# Patient Record
Sex: Male | Born: 1965 | Race: Black or African American | Hispanic: No | Marital: Single | Smoking: Current some day smoker
Health system: Southern US, Community
[De-identification: ages and names within clinical notes are randomized; demographics above are authoritative.]

## PROBLEM LIST (undated history)

## (undated) DIAGNOSIS — A159 Respiratory tuberculosis unspecified: Secondary | ICD-10-CM

## (undated) DIAGNOSIS — I10 Essential (primary) hypertension: Secondary | ICD-10-CM

## (undated) DIAGNOSIS — G4730 Sleep apnea, unspecified: Secondary | ICD-10-CM

## (undated) DIAGNOSIS — N189 Chronic kidney disease, unspecified: Secondary | ICD-10-CM

## (undated) DIAGNOSIS — G43909 Migraine, unspecified, not intractable, without status migrainosus: Secondary | ICD-10-CM

## (undated) DIAGNOSIS — E119 Type 2 diabetes mellitus without complications: Secondary | ICD-10-CM

## (undated) DIAGNOSIS — R569 Unspecified convulsions: Secondary | ICD-10-CM

## (undated) HISTORY — DX: Chronic kidney disease, unspecified: N18.9

## (undated) HISTORY — PX: APPENDECTOMY: SHX54

## (undated) HISTORY — DX: Essential (primary) hypertension: I10

## (undated) HISTORY — DX: Respiratory tuberculosis unspecified: A15.9

## (undated) HISTORY — DX: Type 2 diabetes mellitus without complications: E11.9

## (undated) HISTORY — DX: Unspecified convulsions: R56.9

## (undated) HISTORY — DX: Migraine, unspecified, not intractable, without status migrainosus: G43.909

---

## 2007-10-16 DIAGNOSIS — A159 Respiratory tuberculosis unspecified: Secondary | ICD-10-CM

## 2007-10-16 HISTORY — DX: Respiratory tuberculosis unspecified: A15.9

## 2013-08-04 ENCOUNTER — Emergency Department: Payer: Self-pay | Admitting: Emergency Medicine

## 2013-08-04 LAB — TSH: Thyroid Stimulating Horm: 1.21 u[IU]/mL

## 2013-08-04 LAB — BASIC METABOLIC PANEL
Calcium, Total: 9.2 mg/dL (ref 8.5–10.1)
Chloride: 104 mmol/L (ref 98–107)
Co2: 25 mmol/L (ref 21–32)
Creatinine: 1.68 mg/dL — ABNORMAL HIGH (ref 0.60–1.30)
EGFR (African American): 55 — ABNORMAL LOW
EGFR (Non-African Amer.): 47 — ABNORMAL LOW
Glucose: 111 mg/dL — ABNORMAL HIGH (ref 65–99)
Sodium: 135 mmol/L — ABNORMAL LOW (ref 136–145)

## 2013-08-04 LAB — CBC
MCH: 27.9 pg (ref 26.0–34.0)
MCV: 84 fL (ref 80–100)
RDW: 15.3 % — ABNORMAL HIGH (ref 11.5–14.5)

## 2013-08-04 LAB — TROPONIN I: Troponin-I: <0.02 ng/mL

## 2014-08-01 IMAGING — CR DG CHEST 2V
1 series · 2 of 2 positions shown · non-contrast
Comparison: none

REASON FOR EXAM: Chest Pain
COMMENTS:

[Series 1: pa · 0.17mm/px · 2 of 2 slices shown]
[im 1/2]
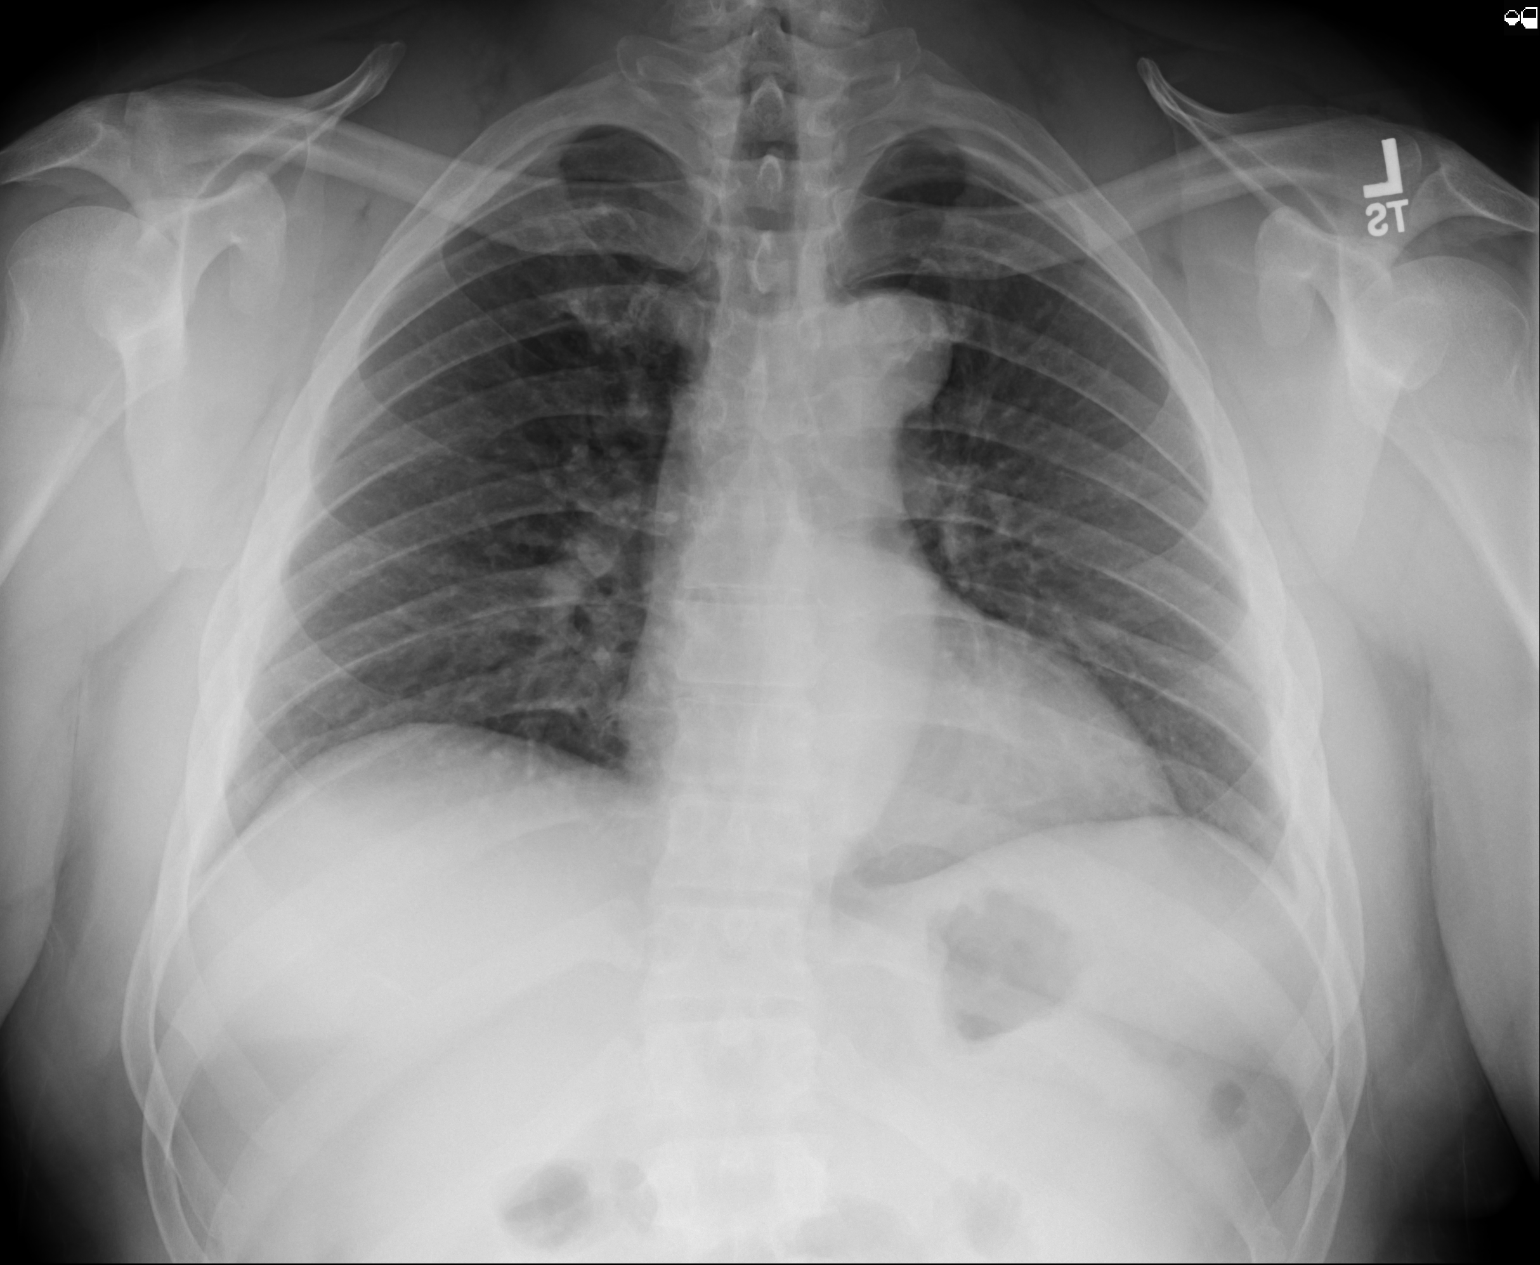
[im 2/2]
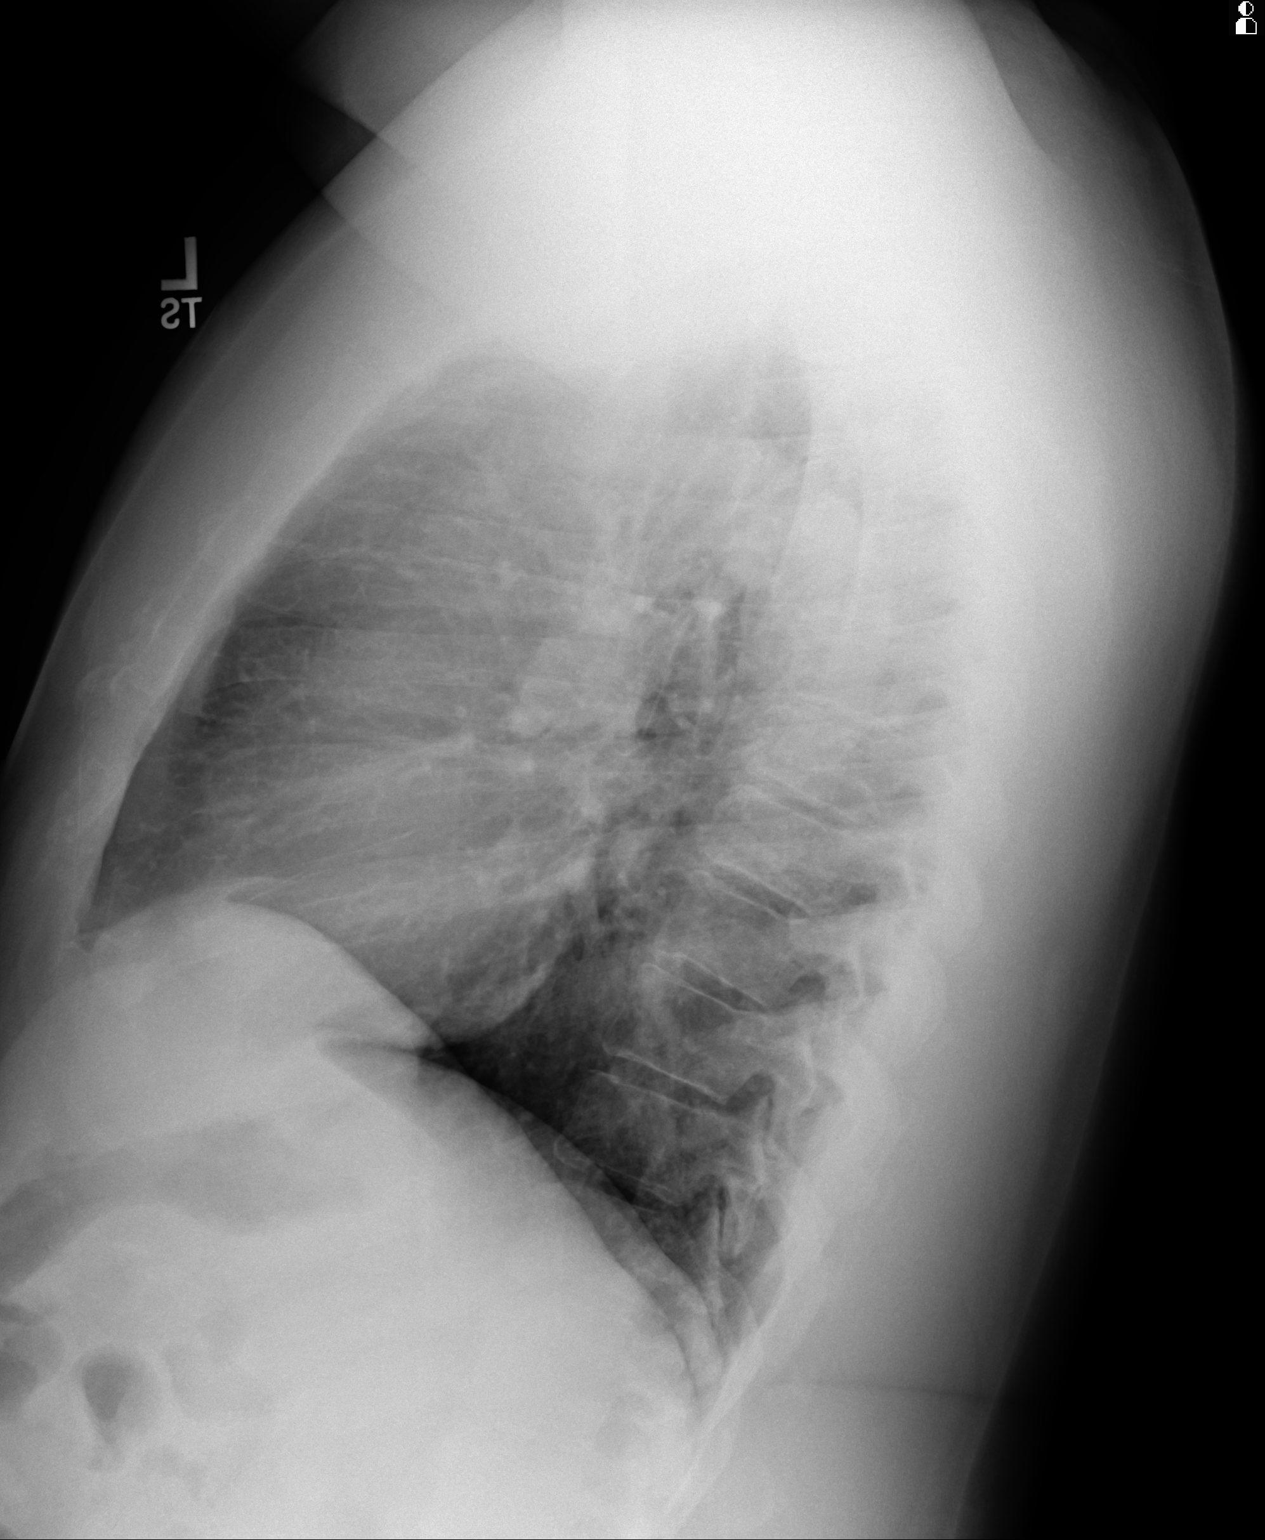

[2 of 2 positions shown; findings below may reference images not displayed]

PROCEDURE:     DXR - DXR CHEST PA (OR AP) AND LATERAL  - August 04, 2013  [DATE]

RESULT:     The lungs are reasonably well inflated. There is no focal
infiltrate. The cardiac silhouette is normal in size. There is tortuosity of
the descending thoracic aorta. There is no pleural effusion. The mediastinum
is normal in width.
IMPRESSION: There is no evidence of pneumonia nor CHF nor other acute
cardiopulmonary abnormality.

[REDACTED]

## 2015-04-27 ENCOUNTER — Ambulatory Visit: Payer: Self-pay | Admitting: Internal Medicine

## 2015-05-04 ENCOUNTER — Ambulatory Visit: Payer: Self-pay | Admitting: Internal Medicine

## 2015-05-11 ENCOUNTER — Ambulatory Visit: Payer: Self-pay | Admitting: Internal Medicine

## 2015-05-11 DIAGNOSIS — I129 Hypertensive chronic kidney disease with stage 1 through stage 4 chronic kidney disease, or unspecified chronic kidney disease: Secondary | ICD-10-CM | POA: Insufficient documentation

## 2015-05-11 LAB — HEPATIC FUNCTION PANEL
ALK PHOS: 72 U/L (ref 25–125)
ALT: 14 U/L (ref 10–40)
AST: 14 U/L (ref 14–40)
Bilirubin, Total: 0.3 mg/dL

## 2015-05-11 LAB — CBC AND DIFFERENTIAL
HCT: 45 % (ref 41–53)
Hemoglobin: 15.2 g/dL (ref 13.5–17.5)
NEUTROS ABS: 5 /uL
Platelets: 296 10*3/uL (ref 150–399)
WBC: 7.9 10^3/mL

## 2015-05-11 LAB — LIPID PANEL
Cholesterol: 167 mg/dL (ref 0–200)
HDL: 40 mg/dL (ref 35–70)
LDL CALC: 79 mg/dL
Triglycerides: 238 mg/dL — AB (ref 40–160)

## 2015-05-11 LAB — PSA: PSA: 0.5

## 2015-05-11 LAB — HEMOGLOBIN A1C: Hemoglobin A1C: 6.1

## 2015-05-11 LAB — TSH: TSH: 2.34 u[IU]/mL (ref 0.41–5.90)

## 2015-06-08 ENCOUNTER — Ambulatory Visit: Payer: Self-pay | Admitting: Internal Medicine

## 2015-06-15 ENCOUNTER — Ambulatory Visit: Payer: Self-pay | Admitting: Internal Medicine

## 2015-07-27 ENCOUNTER — Other Ambulatory Visit: Payer: Self-pay

## 2015-07-27 LAB — BASIC METABOLIC PANEL
BUN: 14 mg/dL (ref 4–21)
Creatinine: 1.4 mg/dL — AB (ref 0.6–1.3)
GLUCOSE: 130 mg/dL
POTASSIUM: 3.5 mmol/L (ref 3.4–5.3)
Sodium: 139 mmol/L (ref 137–147)

## 2015-08-03 ENCOUNTER — Ambulatory Visit: Payer: Self-pay | Admitting: Internal Medicine

## 2015-08-10 ENCOUNTER — Ambulatory Visit: Payer: Self-pay | Admitting: Internal Medicine

## 2015-08-17 ENCOUNTER — Ambulatory Visit: Payer: Self-pay | Admitting: Internal Medicine

## 2015-08-24 ENCOUNTER — Ambulatory Visit: Payer: Self-pay | Admitting: Internal Medicine

## 2015-09-07 DIAGNOSIS — G4730 Sleep apnea, unspecified: Secondary | ICD-10-CM | POA: Insufficient documentation

## 2015-09-14 ENCOUNTER — Ambulatory Visit: Payer: Self-pay | Admitting: Internal Medicine

## 2015-09-21 ENCOUNTER — Ambulatory Visit: Payer: Self-pay

## 2015-10-05 ENCOUNTER — Ambulatory Visit: Payer: Self-pay

## 2015-10-05 ENCOUNTER — Other Ambulatory Visit: Payer: Self-pay

## 2015-10-11 ENCOUNTER — Ambulatory Visit: Payer: Self-pay

## 2015-10-12 ENCOUNTER — Ambulatory Visit: Payer: Self-pay | Admitting: Internal Medicine

## 2016-02-21 DIAGNOSIS — I10 Essential (primary) hypertension: Secondary | ICD-10-CM

## 2016-02-21 DIAGNOSIS — G4730 Sleep apnea, unspecified: Secondary | ICD-10-CM

## 2018-06-26 ENCOUNTER — Ambulatory Visit: Payer: Self-pay

## 2018-07-10 ENCOUNTER — Ambulatory Visit: Payer: Self-pay | Admitting: Adult Health Nurse Practitioner

## 2018-07-10 VITALS — BP 192/134 | HR 92 | Temp 98.2°F | Ht 69.0 in | Wt 243.9 lb

## 2018-07-10 DIAGNOSIS — I10 Essential (primary) hypertension: Secondary | ICD-10-CM

## 2018-07-10 DIAGNOSIS — N183 Chronic kidney disease, stage 3 unspecified: Secondary | ICD-10-CM | POA: Insufficient documentation

## 2018-07-10 MED ORDER — VERAPAMIL HCL ER 240 MG PO TBCR: 240.0000 mg | EXTENDED_RELEASE_TABLET | Freq: Every day | ORAL | 1 refills | Status: DC

## 2018-07-10 MED ORDER — HYDROCHLOROTHIAZIDE 25 MG PO TABS: 25.0000 mg | ORAL_TABLET | Freq: Every day | ORAL | 1 refills | Status: DC

## 2018-07-10 NOTE — Patient Instructions (Signed)
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
## 2018-07-11 LAB — COMPREHENSIVE METABOLIC PANEL
A/G RATIO: 1.2 (ref 1.2–2.2)
ALBUMIN: 4.2 g/dL (ref 3.5–5.5)
ALT: 11 IU/L (ref 0–44)
AST: 12 IU/L (ref 0–40)
Alkaline Phosphatase: 82 IU/L (ref 39–117)
BUN / CREAT RATIO: 10 (ref 9–20)
BUN: 18 mg/dL (ref 6–24)
Bilirubin Total: 0.4 mg/dL (ref 0.0–1.2)
CALCIUM: 9.8 mg/dL (ref 8.7–10.2)
CO2: 27 mmol/L (ref 20–29)
Chloride: 100 mmol/L (ref 96–106)
Creatinine, Ser: 1.86 mg/dL — ABNORMAL HIGH (ref 0.76–1.27)
GFR calc Af Amer: 47 mL/min/{1.73_m2} — ABNORMAL LOW (ref >59)
GFR, EST NON AFRICAN AMERICAN: 40 mL/min/{1.73_m2} — AB (ref >59)
GLUCOSE: 95 mg/dL (ref 65–99)
Globulin, Total: 3.6 g/dL (ref 1.5–4.5)
Potassium: 4.1 mmol/L (ref 3.5–5.2)
Sodium: 142 mmol/L (ref 134–144)
TOTAL PROTEIN: 7.8 g/dL (ref 6.0–8.5)

## 2018-07-11 LAB — LIPID PANEL
CHOL/HDL RATIO: 3.2 ratio (ref 0.0–5.0)
Cholesterol, Total: 162 mg/dL (ref 100–199)
HDL: 50 mg/dL (ref >39)
LDL Calculated: 72 mg/dL (ref 0–99)
Triglycerides: 202 mg/dL — ABNORMAL HIGH (ref 0–149)
VLDL CHOLESTEROL CAL: 40 mg/dL (ref 5–40)

## 2018-07-11 LAB — CBC
HEMOGLOBIN: 15 g/dL (ref 13.0–17.7)
Hematocrit: 44 % (ref 37.5–51.0)
MCH: 27.8 pg (ref 26.6–33.0)
MCHC: 34.1 g/dL (ref 31.5–35.7)
MCV: 82 fL (ref 79–97)
PLATELETS: 283 10*3/uL (ref 150–450)
RBC: 5.39 x10E6/uL (ref 4.14–5.80)
RDW: 15.5 % — ABNORMAL HIGH (ref 12.3–15.4)
WBC: 8.1 10*3/uL (ref 3.4–10.8)

## 2018-07-11 LAB — TSH: TSH: 1.86 u[IU]/mL (ref 0.450–4.500)

## 2018-07-11 LAB — HEMOGLOBIN A1C
Est. average glucose Bld gHb Est-mCnc: 114 mg/dL
Hgb A1c MFr Bld: 5.6 % (ref 4.8–5.6)

## 2018-08-07 ENCOUNTER — Ambulatory Visit: Payer: Self-pay | Admitting: Family Medicine

## 2018-08-07 VITALS — BP 190/129 | HR 83 | Temp 97.7°F | Wt 251.2 lb

## 2018-08-07 DIAGNOSIS — I129 Hypertensive chronic kidney disease with stage 1 through stage 4 chronic kidney disease, or unspecified chronic kidney disease: Secondary | ICD-10-CM

## 2018-08-07 DIAGNOSIS — N400 Enlarged prostate without lower urinary tract symptoms: Secondary | ICD-10-CM | POA: Insufficient documentation

## 2018-08-07 DIAGNOSIS — N401 Enlarged prostate with lower urinary tract symptoms: Secondary | ICD-10-CM

## 2018-08-07 DIAGNOSIS — G8929 Other chronic pain: Secondary | ICD-10-CM

## 2018-08-07 DIAGNOSIS — M545 Low back pain: Secondary | ICD-10-CM

## 2018-08-07 DIAGNOSIS — R351 Nocturia: Secondary | ICD-10-CM

## 2018-08-07 MED ORDER — TAMSULOSIN HCL 0.4 MG PO CAPS: 0.4000 mg | ORAL_CAPSULE | Freq: Every day | ORAL | 3 refills | Status: DC

## 2018-08-07 MED ORDER — AMLODIPINE BESYLATE 10 MG PO TABS: 10.0000 mg | ORAL_TABLET | Freq: Every day | ORAL | 3 refills | Status: DC

## 2018-08-07 NOTE — Progress Notes (Signed)
BP (!) 190/129   Pulse 83   Temp 97.7 F (36.5 C)   Wt 251 lb 3.2 oz (113.9 kg)   BMI 37.10 kg/m    Subjective:    Patient ID: Anthony Lam, male    DOB: [birth year], 52 y.o.   MRN: [id number]  HPI: Anthony Lam is a 52 y.o. male  Chief Complaint  Patient presents with  . Follow-up   Wants disability paperwork filled out   Last seen about a month ago for his blood pressure- had been running really badly. Had been seen in 2017, but then moved away, Used to be on verapamil and HCTZ  HYPERTENSION- only started taking his medicine last week Hypertension status: stable  Satisfied with current treatment? no Duration of hypertension: chronic BP monitoring frequency:  not checking BP medication side effects:  no Medication compliance: good compliance Previous BP meds: verapamil and HCTZ Aspirin: no Recurrent headaches: no Visual changes: no Palpitations: no Dyspnea: no Chest pain: no Lower extremity edema: no Dizzy/lightheaded: no  BPH BPH status: uncontrolled Satisfied with current treatment?: not on anything Duration: chronic Nocturia: 4-5x per night Urinary frequency:yes Incomplete voiding: yes Urgency: yes Weak urinary stream: yes Straining to start stream: yes Dysuria: no Onset: gradual Severity: severe   Relevant past medical, surgical, family and social history reviewed and updated as indicated. Interim medical history since our last visit reviewed. Allergies and medications reviewed and updated.  Review of Systems  Constitutional: Negative.   Respiratory: Negative.   Cardiovascular: Negative.   Musculoskeletal: Positive for back pain and myalgias. Negative for arthralgias, gait problem, joint swelling, neck pain and neck stiffness.  Skin: Negative.   Neurological: Negative.   Psychiatric/Behavioral: Negative.     Per HPI unless specifically indicated above     Objective:    BP (!) 190/129   Pulse 83   Temp 97.7 F (36.5 C)   Wt 251  lb 3.2 oz (113.9 kg)   BMI 37.10 kg/m   Wt Readings from Last 3 Encounters:  08/07/18 251 lb 3.2 oz (113.9 kg)  07/10/18 243 lb 14.4 oz (110.6 kg)  09/07/15 239 lb (108.4 kg)    Physical Exam  Constitutional: He is oriented to person, place, and time. He appears well-developed and well-nourished. No distress.  HENT:  Head: Normocephalic and atraumatic.  Right Ear: Hearing normal.  Left Ear: Hearing normal.  Nose: Nose normal.  Eyes: Conjunctivae and lids are normal. Right eye exhibits no discharge. Left eye exhibits no discharge. No scleral icterus.  Cardiovascular: Normal rate, regular rhythm, normal heart sounds and intact distal pulses. Exam reveals no gallop and no friction rub.  No murmur heard. Pulmonary/Chest: Effort normal and breath sounds normal. No stridor. No respiratory distress. He has no wheezes. He has no rales. He exhibits no tenderness.  Musculoskeletal: Normal range of motion.  Neurological: He is alert and oriented to person, place, and time.  Skin: Skin is warm, dry and intact. Capillary refill takes less than 2 seconds. No rash noted. He is not diaphoretic. No erythema. No pallor.  Psychiatric: He has a normal mood and affect. His speech is normal and behavior is normal. Judgment and thought content normal. Cognition and memory are normal.  Nursing note and vitals reviewed.   Results for orders placed or performed in visit on 07/10/18  CBC  Result Value Ref Range   WBC 8.1 3.4 - 10.8 x10E3/uL   RBC 5.39 4.14 - 5.80 x10E6/uL   Hemoglobin 15.0 13.0 -  17.7 g/dL   Hematocrit 44.0 37.5 - 51.0 %   MCV 82 79 - 97 fL   MCH 27.8 26.6 - 33.0 pg   MCHC 34.1 31.5 - 35.7 g/dL   RDW 15.5 (H) 12.3 - 15.4 %   Platelets 283 150 - 450 x10E3/uL  Comp Met (CMET)  Result Value Ref Range   Glucose 95 65 - 99 mg/dL   BUN 18 6 - 24 mg/dL   Creatinine, Ser 1.86 (H) 0.76 - 1.27 mg/dL   GFR calc non Af Amer 40 (L) >59 mL/min/1.73   GFR calc Af Amer 47 (L) >59 mL/min/1.73    BUN/Creatinine Ratio 10 9 - 20   Sodium 142 134 - 144 mmol/L   Potassium 4.1 3.5 - 5.2 mmol/L   Chloride 100 96 - 106 mmol/L   CO2 27 20 - 29 mmol/L   Calcium 9.8 8.7 - 10.2 mg/dL   Total Protein 7.8 6.0 - 8.5 g/dL   Albumin 4.2 3.5 - 5.5 g/dL   Globulin, Total 3.6 1.5 - 4.5 g/dL   Albumin/Globulin Ratio 1.2 1.2 - 2.2   Bilirubin Total 0.4 0.0 - 1.2 mg/dL   Alkaline Phosphatase 82 39 - 117 IU/L   AST 12 0 - 40 IU/L   ALT 11 0 - 44 IU/L  Lipid Profile  Result Value Ref Range   Cholesterol, Total 162 100 - 199 mg/dL   Triglycerides 202 (H) 0 - 149 mg/dL   HDL 50 >39 mg/dL   VLDL Cholesterol Cal 40 5 - 40 mg/dL   LDL Calculated 72 0 - 99 mg/dL   Chol/HDL Ratio 3.2 0.0 - 5.0 ratio  HgB A1c  Result Value Ref Range   Hgb A1c MFr Bld 5.6 4.8 - 5.6 %   Est. average glucose Bld gHb Est-mCnc 114 mg/dL  TSH  Result Value Ref Range   TSH 1.860 0.450 - 4.500 uIU/mL      Assessment & Plan:   Problem List Items Addressed This Visit      Genitourinary   Benign hypertensive renal disease - Primary    Under poor control. Will continue verapamil and HCTZ and start amlodipine. Checking BMP today. Recheck BP in 2 weeks. Call with any concerns.       Relevant Orders   Basic metabolic panel   BPH (benign prostatic hyperplasia)    Not under good control. Will start flomax and recheck on symptoms in 2 weeks. Call with any concerns.       Relevant Medications   tamsulosin (FLOMAX) 0.4 MG CAPS capsule    Other Visit Diagnoses    Chronic bilateral low back pain without sciatica       Discussed that we cannot fill out disability paperwork at this office- directed him to the social services office for further evaluation.        Follow up plan: Return in about 2 weeks (around 08/21/2018) for Recheck BP.

## 2018-08-07 NOTE — Assessment & Plan Note (Signed)
Not under good control. Will start flomax and recheck on symptoms in 2 weeks. Call with any concerns.

## 2018-08-07 NOTE — Assessment & Plan Note (Signed)
Under poor control. Will continue verapamil and HCTZ and start amlodipine. Checking BMP today. Recheck BP in 2 weeks. Call with any concerns.

## 2018-08-08 LAB — BASIC METABOLIC PANEL
BUN/Creatinine Ratio: 11 (ref 9–20)
BUN: 19 mg/dL (ref 6–24)
CALCIUM: 9.8 mg/dL (ref 8.7–10.2)
CHLORIDE: 102 mmol/L (ref 96–106)
CO2: 25 mmol/L (ref 20–29)
Creatinine, Ser: 1.74 mg/dL — ABNORMAL HIGH (ref 0.76–1.27)
GFR, EST AFRICAN AMERICAN: 51 mL/min/{1.73_m2} — AB (ref >59)
GFR, EST NON AFRICAN AMERICAN: 44 mL/min/{1.73_m2} — AB (ref >59)
Glucose: 88 mg/dL (ref 65–99)
Potassium: 3.7 mmol/L (ref 3.5–5.2)
Sodium: 142 mmol/L (ref 134–144)

## 2018-08-26 ENCOUNTER — Ambulatory Visit: Payer: Self-pay

## 2018-08-28 ENCOUNTER — Ambulatory Visit: Payer: Self-pay | Admitting: Adult Health Nurse Practitioner

## 2018-08-28 VITALS — BP 193/38 | HR 91 | Temp 98.7°F | Ht 68.0 in | Wt 249.2 lb

## 2018-08-28 DIAGNOSIS — N401 Enlarged prostate with lower urinary tract symptoms: Secondary | ICD-10-CM

## 2018-08-28 DIAGNOSIS — M545 Low back pain, unspecified: Secondary | ICD-10-CM

## 2018-08-28 DIAGNOSIS — M549 Dorsalgia, unspecified: Secondary | ICD-10-CM | POA: Insufficient documentation

## 2018-08-28 DIAGNOSIS — R351 Nocturia: Secondary | ICD-10-CM

## 2018-08-28 DIAGNOSIS — I129 Hypertensive chronic kidney disease with stage 1 through stage 4 chronic kidney disease, or unspecified chronic kidney disease: Secondary | ICD-10-CM

## 2018-08-28 DIAGNOSIS — N183 Chronic kidney disease, stage 3 unspecified: Secondary | ICD-10-CM
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
## 2018-09-16 ENCOUNTER — Ambulatory Visit: Payer: Self-pay | Admitting: Specialist

## 2018-09-18 ENCOUNTER — Ambulatory Visit: Payer: Self-pay | Admitting: Family Medicine

## 2018-09-18 VITALS — BP 184/116 | HR 87 | Temp 98.6°F | Ht 68.0 in | Wt 249.5 lb

## 2018-09-18 DIAGNOSIS — N183 Chronic kidney disease, stage 3 unspecified: Secondary | ICD-10-CM

## 2018-09-18 DIAGNOSIS — I10 Essential (primary) hypertension: Secondary | ICD-10-CM

## 2018-09-18 DIAGNOSIS — M545 Low back pain, unspecified: Secondary | ICD-10-CM

## 2018-09-18 MED ORDER — METOPROLOL SUCCINATE ER 50 MG PO TB24: 50.0000 mg | ORAL_TABLET | Freq: Every day | ORAL | 0 refills | Status: DC

## 2018-09-18 MED ORDER — HYDRALAZINE HCL 25 MG PO TABS: 25.0000 mg | ORAL_TABLET | Freq: Three times a day (TID) | ORAL | 1 refills | Status: DC
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
## 2018-09-18 NOTE — Patient Instructions (Addendum)
Stop taking Verapamil.  Begin Metoprolol once daily Begin Hydralazine 1 pill, 3 times a day. If you forget 3 times, start taking 2 times a day.  Do regular exercise and gentle stretching.  Check BP at home when relaxed and record readings and bring to next appointment.

## 2018-09-23 ENCOUNTER — Ambulatory Visit: Payer: Self-pay | Admitting: Specialist

## 2018-09-23 ENCOUNTER — Other Ambulatory Visit: Payer: Self-pay

## 2018-09-23 DIAGNOSIS — M545 Low back pain, unspecified: Secondary | ICD-10-CM

## 2018-09-23 NOTE — Progress Notes (Signed)
  Subjective:     Patient ID: Anthony Lam, male   DOB: September 10, 1966, 52 y.o.   MRN: [id number]  HPI  52 year old with a several year history of axial LBP. I don't believe he's had any treatment for this in the past. When he was working, he was in [employer] business so on his feet all day. He has a history of kidney disease and in fact thought I was evaluating that problem. There is no specific history of trauma.   Review of Systems       Objective:   Physical Exam  On exam he has an antalgic gait because of a right forefoot problem. He tells me he is unable to walk on his toes or heels. He declines to do a squat because of back pain.  He is able to march in place with normal muscle recruitment and relaxation. He is ale to stand on each leg indepenmdently with a negative trendelenburg.  Using a long arm goniometer he has 20 degrees of lateral flexion to his right and 15 degrees to left. He has 45 degrees forward flexion and 10 degrees of extentsion.   Sitting: DTR's at the knees are 0 with reinforcement bilateral;  Ankles 2+ and equal. Toe signs are downward. There is no clonus present.  Sens: Intact and normal. MMT 5/5 in all muscle groups.  Seated: SLR negative.           Assessment:         Plan:    Dx: Nonspecific LBP. Plan: I want to refer him for x rays and return here when they have been completed.

## 2018-09-25 ENCOUNTER — Ambulatory Visit: Payer: Self-pay

## 2018-10-21 ENCOUNTER — Ambulatory Visit: Payer: Self-pay | Admitting: Specialist

## 2018-10-21 ENCOUNTER — Encounter: Payer: Self-pay | Admitting: Gerontology

## 2018-10-21 ENCOUNTER — Ambulatory Visit: Payer: Self-pay | Admitting: Gerontology

## 2018-10-21 VITALS — BP 193/130 | HR 79 | Temp 98.6°F | Ht 69.6 in | Wt 249.3 lb

## 2018-10-21 DIAGNOSIS — M545 Low back pain, unspecified: Secondary | ICD-10-CM

## 2018-10-21 DIAGNOSIS — N183 Chronic kidney disease, stage 3 unspecified: Secondary | ICD-10-CM

## 2018-10-21 DIAGNOSIS — I10 Essential (primary) hypertension: Secondary | ICD-10-CM

## 2018-10-21 NOTE — Progress Notes (Signed)
Pt arrived without having ordered images completed. Pt states he has an appt for same on 10-30-18. Per Dr. Vickki Hearing, pt is to return after images completed for return assessment.

## 2018-10-21 NOTE — Progress Notes (Signed)
Patient: Anthony Lam Male    DOB: 09/09/1966   53 y.o.   MRN: [id number] Visit Date: 10/21/2018  Today's Provider: Langston Reusing, NP   Chief Complaint  Patient presents with  . Chronic Kidney Disease    F/U today for hypertension. States not taking meds as prescribed due to c/o of nausea.   Subjective:    HPI Mr.Bautch 53 y/o presents for f/u on uncontrolled hypertension, low back pain and CKD.At last visit he was started on 25 mg hydralazine TID or 2 tabs of 25 mg hydralazine BID,hydrochlorothiazide 25 mg daily, Metoprolol 50 mg daily and to stop Verapamil.  HTN: He stated that he stopped taking hydralazine, hydrochlorothiazide, metoprolol, Amlodipine and Verapamil on 10/09/18 because he was feeling dizzy. He reports that he resumed taking Amlodipine 10 mg and Verapamil 240 mg after 10/15/2018 and the light headedness resolved. He reports checking BP at home but did not keep any log. He reports that he's having 4/10 right temporal no radiating HA that started this morning after he jumped out to come for his morning appointment. He admits that he has being eating baked ham since 10/15/2018, but stopped yesterday. He denies chest pain, palpitation, blurry vision, peripheral edema.  Low back pain: He reports that he continues to experience 7/10 back pain with bending, and will have x ray to lower back on 10/30/2018 and will follow up with Dr. Vickki Hearing.  CKD 3: He stated that he has not been to the nephrologist.      No Known Allergies Previous Medications   AMLODIPINE (NORVASC) 10 MG TABLET    Take 1 tablet (10 mg total) by mouth daily.   HYDRALAZINE (APRESOLINE) 25 MG TABLET    Take 1 tablet (25 mg total) by mouth 3 (three) times daily. Increase to 2 tabs 3 times a day if BP is still high (more than 150/90).   HYDROCHLOROTHIAZIDE (HYDRODIURIL) 25 MG TABLET    Take 1 tablet (25 mg total) by mouth daily.   METOPROLOL SUCCINATE (TOPROL-XL) 50 MG 24 HR TABLET    Take 1 tablet (50 mg total)  by mouth daily. Take with or immediately following a meal.   TAMSULOSIN (FLOMAX) 0.4 MG CAPS CAPSULE    Take 1 capsule (0.4 mg total) by mouth daily.   VERAPAMIL (CALAN-SR) 240 MG CR TABLET    Take 1 tablet (240 mg total) by mouth daily.    Review of Systems  Constitutional: Negative.   HENT: Negative.   Eyes: Negative.   Respiratory: Negative.   Cardiovascular: Negative.   Gastrointestinal: Negative.   Genitourinary: Negative.   Musculoskeletal: Positive for back pain (chronic lower back pain).  Skin: Negative.   Neurological: Negative.   Psychiatric/Behavioral: Negative.     Social History   Tobacco Use  . Smoking status: Light Tobacco Smoker    Packs/day: 0.25    Years: 15.00    Pack years: 3.75  . Smokeless tobacco: Never Used  Substance Use Topics  . Alcohol use: Not Currently    Comment: Not anymore   Objective:   BP (!) 193/130 (BP Location: Left Arm, Patient Position: Sitting, Cuff Size: Large)   Pulse 79   Temp 98.6 F (37 C) (Oral)   Ht 5' 9.6" (1.768 m)   Wt 249 lb 4.8 oz (113.1 kg)   BMI 36.18 kg/m   Physical Exam Constitutional:      Appearance: Normal appearance.  HENT:     Head: Normocephalic and atraumatic.  Mouth/Throat:     Mouth: Mucous membranes are moist.  Eyes:     Extraocular Movements: Extraocular movements intact.     Pupils: Pupils are equal, round, and reactive to light.  Neck:     Musculoskeletal: Normal range of motion.  Cardiovascular:     Rate and Rhythm: Normal rate and regular rhythm.     Pulses: Normal pulses.     Heart sounds: Normal heart sounds.  Pulmonary:     Effort: Pulmonary effort is normal.     Breath sounds: Normal breath sounds.  Abdominal:     General: Bowel sounds are normal.     Palpations: Abdomen is soft.  Musculoskeletal: Normal range of motion.       Back:  Skin:    General: Skin is warm and dry.  Neurological:     General: No focal deficit present.     Mental Status: He is alert and oriented  to person, place, and time.  Psychiatric:        Mood and Affect: Mood normal.        Behavior: Behavior normal.         Assessment & Plan:     1. Essential hypertension - Uncontrolled blood pressure, goal is <130/80, BP rechecked was 193/130 during visit. - He stated that he will only take 240 mg Verapamil and 10 mg Norvasc daily. - He was advised to stop eating ham, continue on low salt diet, exercise at least 30 minutes daily. -He was encouraged to quit smoking - He agreed to check BP BID, and bring log to next weeks appointment - He stated that his blood pressure will come down and will not go to the ED, unless it's necessary. - He was advised to go to the emergency room with worsening HA, dizziness, change in speech or weakness to arms, he verbalized understanding and agreed to the plan.  2. CKD (chronic kidney disease) stage 3, GFR 30-59 ml/min (HCC)  - Ambulatory referral to Nephrology - He was encouraged to complete the charity care application   3. Bilateral low back pain without sciatica, unspecified chronicity - He will have lower back x ray 10/30/2018 and follow up with Dr Vickki Hearing. - He will continue doing back exercise, take tylenol for pain and encouraged not to take NSAID. - Follow up in 1 week        [place], NP   [facility]

## 2018-10-21 NOTE — Patient Instructions (Signed)
Back Exercises If you have pain in your back, do these exercises 2-3 times each day or as told by your doctor. When the pain goes away, do the exercises once each day, but repeat the steps more times for each exercise (do more repetitions). If you do not have pain in your back, do these exercises once each day or as told by your doctor. Exercises Single Knee to Chest Do these steps 3-5 times in a row for each leg: 1. Lie on your back on a firm bed or the floor with your legs stretched out. 2. Bring one knee to your chest. 3. Hold your knee to your chest by grabbing your knee or thigh. 4. Pull on your knee until you feel a gentle stretch in your lower back. 5. Keep doing the stretch for 10-30 seconds. 6. Slowly let go of your leg and straighten it. Pelvic Tilt Do these steps 5-10 times in a row: 1. Lie on your back on a firm bed or the floor with your legs stretched out. 2. Bend your knees so they point up to the ceiling. Your feet should be flat on the floor. 3. Tighten your lower belly (abdomen) muscles to press your lower back against the floor. This will make your tailbone point up to the ceiling instead of pointing down to your feet or the floor. 4. Stay in this position for 5-10 seconds while you gently tighten your muscles and breathe evenly. Cat-Cow Do these steps until your lower back bends more easily: 1. Get on your hands and knees on a firm surface. Keep your hands under your shoulders, and keep your knees under your hips. You may put padding under your knees. 2. Let your head hang down, and make your tailbone point down to the floor so your lower back is round like the back of a cat. 3. Stay in this position for 5 seconds. 4. Slowly lift your head and make your tailbone point up to the ceiling so your back hangs low (sags) like the back of a cow. 5. Stay in this position for 5 seconds.  Press-Ups Do these steps 5-10 times in a row: 1. Lie on your belly (face-down) on the  floor. 2. Place your hands near your head, about shoulder-width apart. 3. While you keep your back relaxed and keep your hips on the floor, slowly straighten your arms to raise the top half of your body and lift your shoulders. Do not use your back muscles. To make yourself more comfortable, you may change where you place your hands. 4. Stay in this position for 5 seconds. 5. Slowly return to lying flat on the floor.  Bridges Do these steps 10 times in a row: 1. Lie on your back on a firm surface. 2. Bend your knees so they point up to the ceiling. Your feet should be flat on the floor. 3. Tighten your butt muscles and lift your butt off of the floor until your waist is almost as high as your knees. If you do not feel the muscles working in your butt and the back of your thighs, slide your feet 1-2 inches farther away from your butt. 4. Stay in this position for 3-5 seconds. 5. Slowly lower your butt to the floor, and let your butt muscles relax. If this exercise is too easy, try doing it with your arms crossed over your chest. Belly Crunches Do these steps 5-10 times in a row: 1. Lie on your back on a  firm bed or the floor with your legs stretched out. 2. Bend your knees so they point up to the ceiling. Your feet should be flat on the floor. 3. Cross your arms over your chest. 4. Tip your chin a little bit toward your chest but do not bend your neck. 5. Tighten your belly muscles and slowly raise your chest just enough to lift your shoulder blades a tiny bit off of the floor. 6. Slowly lower your chest and your head to the floor. Back Lifts Do these steps 5-10 times in a row: 1. Lie on your belly (face-down) with your arms at your sides, and rest your forehead on the floor. 2. Tighten the muscles in your legs and your butt. 3. Slowly lift your chest off of the floor while you keep your hips on the floor. Keep the back of your head in line with the curve in your back. Look at the floor  while you do this. 4. Stay in this position for 3-5 seconds. 5. Slowly lower your chest and your face to the floor. Contact a doctor if:  Your back pain gets a lot worse when you do an exercise.  Your back pain does not lessen 2 hours after you exercise. If you have any of these problems, stop doing the exercises. Do not do them again unless your doctor says it is okay. Get help right away if:  You have sudden, very bad back pain. If this happens, stop doing the exercises. Do not do them again unless your doctor says it is okay. This information is not intended to replace advice given to you by your health care provider. Make sure you discuss any questions you have with your health care provider. Document Released: 11/03/2010 Document Revised: 06/25/2018 Document Reviewed: 11/25/2014 Elsevier Interactive Patient Education  2019 [place] DASH stands for "Dietary Approaches to Stop Hypertension." The DASH eating plan is a healthy eating plan that has been shown to reduce high blood pressure (hypertension). It may also reduce your risk for type 2 diabetes, heart disease, and stroke. The DASH eating plan may also help with weight loss. What are tips for following this plan?  General guidelines  Avoid eating more than 2,300 mg (milligrams) of salt (sodium) a day. If you have hypertension, you may need to reduce your sodium intake to 1,500 mg a day.  Limit alcohol intake to no more than 1 drink a day for nonpregnant women and 2 drinks a day for men. One drink equals 12 oz of beer, 5 oz of wine, or 1 oz of hard liquor.  Work with your health care provider to maintain a healthy body weight or to lose weight. Ask what an ideal weight is for you.  Get at least 30 minutes of exercise that causes your heart to beat faster (aerobic exercise) most days of the week. Activities may include walking, swimming, or biking.  Work with your health care provider or diet and nutrition  specialist (dietitian) to adjust your eating plan to your individual calorie needs. Reading food labels   Check food labels for the amount of sodium per serving. Choose foods with less than 5 percent of the Daily Value of sodium. Generally, foods with less than 300 mg of sodium per serving fit into this eating plan.  To find whole grains, look for the word "whole" as the first word in the ingredient list. Shopping  Buy products labeled as "low-sodium" or "no salt added."  Buy fresh foods. Avoid canned foods and premade or frozen meals. Cooking  Avoid adding salt when cooking. Use salt-free seasonings or herbs instead of table salt or sea salt. Check with your health care provider or pharmacist before using salt substitutes.  Do not fry foods. Cook foods using healthy methods such as baking, boiling, grilling, and broiling instead.  Cook with heart-healthy oils, such as olive, canola, soybean, or sunflower oil. Meal planning  Eat a balanced diet that includes: ? 5 or more servings of fruits and vegetables each day. At each meal, try to fill half of your plate with fruits and vegetables. ? Up to 6-8 servings of whole grains each day. ? Less than 6 oz of lean meat, poultry, or fish each day. A 3-oz serving of meat is about the same size as a deck of cards. One egg equals 1 oz. ? 2 servings of low-fat dairy each day. ? A serving of nuts, seeds, or beans 5 times each week. ? Heart-healthy fats. Healthy fats called Omega-3 fatty acids are found in foods such as flaxseeds and coldwater fish, like sardines, salmon, and mackerel.  Limit how much you eat of the following: ? Canned or prepackaged foods. ? Food that is high in trans fat, such as fried foods. ? Food that is high in saturated fat, such as fatty meat. ? Sweets, desserts, sugary drinks, and other foods with added sugar. ? Full-fat dairy products.  Do not salt foods before eating.  Try to eat at least 2 vegetarian meals each  week.  Eat more home-cooked food and less restaurant, buffet, and fast food.  When eating at a restaurant, ask that your food be prepared with less salt or no salt, if possible. What foods are recommended? The items listed may not be a complete list. Talk with your dietitian about what dietary choices are best for you. Grains Whole-grain or whole-wheat bread. Whole-grain or whole-wheat pasta. Brown rice. [place]. Bulgur. Whole-grain and low-sodium cereals. Pita bread. Low-fat, low-sodium crackers. Whole-wheat flour tortillas. Vegetables Fresh or frozen vegetables (raw, steamed, roasted, or grilled). Low-sodium or reduced-sodium tomato and vegetable juice. Low-sodium or reduced-sodium tomato sauce and tomato paste. Low-sodium or reduced-sodium canned vegetables. Fruits All fresh, dried, or frozen fruit. Canned fruit in natural juice (without added sugar). Meat and other protein foods Skinless chicken or [place]. Ground chicken or [place]. Pork with fat trimmed off. Fish and seafood. Egg whites. Dried beans, peas, or lentils. Unsalted nuts, nut butters, and seeds. Unsalted canned beans. Lean cuts of beef with fat trimmed off. Low-sodium, lean deli meat. Dairy Low-fat (1%) or fat-free (skim) milk. Fat-free, low-fat, or reduced-fat cheeses. Nonfat, low-sodium ricotta or cottage cheese. Low-fat or nonfat yogurt. Low-fat, low-sodium cheese. Fats and oils Soft margarine without trans fats. Vegetable oil. Low-fat, reduced-fat, or light mayonnaise and salad dressings (reduced-sodium). Canola, safflower, olive, soybean, and sunflower oils. Avocado. Seasoning and other foods Herbs. Spices. Seasoning mixes without salt. Unsalted popcorn and pretzels. Fat-free sweets. What foods are not recommended? The items listed may not be a complete list. Talk with your dietitian about what dietary choices are best for you. Grains Baked goods made with fat, such as croissants, muffins, or some breads. Dry pasta  or rice meal packs. Vegetables Creamed or fried vegetables. Vegetables in a cheese sauce. Regular canned vegetables (not low-sodium or reduced-sodium). Regular canned tomato sauce and paste (not low-sodium or reduced-sodium). Regular tomato and vegetable juice (not low-sodium or reduced-sodium). Angie Fava. Olives. Fruits Canned fruit in a  light or heavy syrup. Fried fruit. Fruit in cream or butter sauce. Meat and other protein foods Fatty cuts of meat. Ribs. Fried meat. Berniece Salines. Sausage. Bologna and other processed lunch meats. Salami. Fatback. Hotdogs. Bratwurst. Salted nuts and seeds. Canned beans with added salt. Canned or smoked fish. Whole eggs or egg yolks. Chicken or [place] with skin. Dairy Whole or 2% milk, cream, and half-and-half. Whole or full-fat cream cheese. Whole-fat or sweetened yogurt. Full-fat cheese. Nondairy creamers. Whipped toppings. Processed cheese and cheese spreads. Fats and oils Butter. Stick margarine. Lard. Shortening. Ghee. Bacon fat. Tropical oils, such as coconut, palm kernel, or palm oil. Seasoning and other foods Salted popcorn and pretzels. Onion salt, garlic salt, seasoned salt, table salt, and sea salt. Worcestershire sauce. Tartar sauce. Barbecue sauce. Teriyaki sauce. Soy sauce, including reduced-sodium. Steak sauce. Canned and packaged gravies. Fish sauce. Oyster sauce. Cocktail sauce. Horseradish that you find on the shelf. Ketchup. Mustard. Meat flavorings and tenderizers. Bouillon cubes. Hot sauce and Tabasco sauce. Premade or packaged marinades. Premade or packaged taco seasonings. Relishes. Regular salad dressings. Where to find more information:  National Heart, Lung, and [place]: https://wilson-eaton.com/  American Heart Association: www.heart.org Summary  The DASH eating plan is a healthy eating plan that has been shown to reduce high blood pressure (hypertension). It may also reduce your risk for type 2 diabetes, heart disease, and stroke.  With the  DASH eating plan, you should limit salt (sodium) intake to 2,300 mg a day. If you have hypertension, you may need to reduce your sodium intake to 1,500 mg a day.  When on the DASH eating plan, aim to eat more fresh fruits and vegetables, whole grains, lean proteins, low-fat dairy, and heart-healthy fats.  Work with your health care provider or diet and nutrition specialist (dietitian) to adjust your eating plan to your individual calorie needs. This information is not intended to replace advice given to you by your health care provider. Make sure you discuss any questions you have with your health care provider. Document Released: 09/20/2011 Document Revised: 09/24/2016 Document Reviewed: 09/24/2016 Elsevier Interactive Patient Education  2019 [employer].

## 2018-10-28 ENCOUNTER — Ambulatory Visit: Payer: Self-pay | Admitting: Gerontology

## 2018-11-04 ENCOUNTER — Ambulatory Visit: Payer: Self-pay | Admitting: Gerontology

## 2018-11-11 ENCOUNTER — Other Ambulatory Visit: Payer: Self-pay

## 2018-11-11 ENCOUNTER — Ambulatory Visit: Payer: Self-pay | Admitting: Gerontology

## 2018-11-11 ENCOUNTER — Encounter: Payer: Self-pay | Admitting: Gerontology

## 2018-11-11 VITALS — BP 140/105 | HR 77 | Ht 69.0 in | Wt 248.0 lb

## 2018-11-11 DIAGNOSIS — M545 Low back pain, unspecified: Secondary | ICD-10-CM

## 2018-11-11 DIAGNOSIS — Z0000 Encounter for general adult medical examination without abnormal findings: Secondary | ICD-10-CM

## 2018-11-11 DIAGNOSIS — G8929 Other chronic pain: Secondary | ICD-10-CM

## 2018-11-11 DIAGNOSIS — N183 Chronic kidney disease, stage 3 unspecified: Secondary | ICD-10-CM

## 2018-11-11 DIAGNOSIS — I10 Essential (primary) hypertension: Secondary | ICD-10-CM

## 2018-11-11 MED ORDER — AMLODIPINE BESYLATE 10 MG PO TABS: 10.0000 mg | ORAL_TABLET | Freq: Every day | ORAL | 3 refills | Status: DC

## 2018-11-11 MED ORDER — LOSARTAN POTASSIUM 50 MG PO TABS: 25.0000 mg | ORAL_TABLET | Freq: Every day | ORAL | 2 refills | Status: DC

## 2018-11-11 MED ORDER — HYDROCHLOROTHIAZIDE 25 MG PO TABS: 25.0000 mg | ORAL_TABLET | Freq: Every day | ORAL | 3 refills | Status: DC

## 2018-11-11 NOTE — Patient Instructions (Addendum)
Dr Faythe Dingwall Recommends that you take Vitamin D3 2000 units daily, you can buy from [facility].     Back Exercises If you have pain in your back, do these exercises 2-3 times each day or as told by your doctor. When the pain goes away, do the exercises once each day, but repeat the steps more times for each exercise (do more repetitions). If you do not have pain in your back, do these exercises once each day or as told by your doctor. Exercises Single Knee to Chest Do these steps 3-5 times in a row for each leg: 1. Lie on your back on a firm bed or the floor with your legs stretched out. 2. Bring one knee to your chest. 3. Hold your knee to your chest by grabbing your knee or thigh. 4. Pull on your knee until you feel a gentle stretch in your lower back. 5. Keep doing the stretch for 10-30 seconds. 6. Slowly let go of your leg and straighten it. Pelvic Tilt Do these steps 5-10 times in a row: 1. Lie on your back on a firm bed or the floor with your legs stretched out. 2. Bend your knees so they point up to the ceiling. Your feet should be flat on the floor. 3. Tighten your lower belly (abdomen) muscles to press your lower back against the floor. This will make your tailbone point up to the ceiling instead of pointing down to your feet or the floor. 4. Stay in this position for 5-10 seconds while you gently tighten your muscles and breathe evenly. Cat-Cow Do these steps until your lower back bends more easily: 1. Get on your hands and knees on a firm surface. Keep your hands under your shoulders, and keep your knees under your hips. You may put padding under your knees. 2. Let your head hang down, and make your tailbone point down to the floor so your lower back is round like the back of a cat. 3. Stay in this position for 5 seconds. 4. Slowly lift your head and make your tailbone point up to the ceiling so your back hangs low (sags) like the back of a cow. 5. Stay in this position for 5  seconds.  Press-Ups Do these steps 5-10 times in a row: 1. Lie on your belly (face-down) on the floor. 2. Place your hands near your head, about shoulder-width apart. 3. While you keep your back relaxed and keep your hips on the floor, slowly straighten your arms to raise the top half of your body and lift your shoulders. Do not use your back muscles. To make yourself more comfortable, you may change where you place your hands. 4. Stay in this position for 5 seconds. 5. Slowly return to lying flat on the floor.  Bridges Do these steps 10 times in a row: 1. Lie on your back on a firm surface. 2. Bend your knees so they point up to the ceiling. Your feet should be flat on the floor. 3. Tighten your butt muscles and lift your butt off of the floor until your waist is almost as high as your knees. If you do not feel the muscles working in your butt and the back of your thighs, slide your feet 1-2 inches farther away from your butt. 4. Stay in this position for 3-5 seconds. 5. Slowly lower your butt to the floor, and let your butt muscles relax. If this exercise is too easy, try doing it with your arms crossed  over your chest. Belly Crunches Do these steps 5-10 times in a row: 1. Lie on your back on a firm bed or the floor with your legs stretched out. 2. Bend your knees so they point up to the ceiling. Your feet should be flat on the floor. 3. Cross your arms over your chest. 4. Tip your chin a little bit toward your chest but do not bend your neck. 5. Tighten your belly muscles and slowly raise your chest just enough to lift your shoulder blades a tiny bit off of the floor. 6. Slowly lower your chest and your head to the floor. Back Lifts Do these steps 5-10 times in a row: 1. Lie on your belly (face-down) with your arms at your sides, and rest your forehead on the floor. 2. Tighten the muscles in your legs and your butt. 3. Slowly lift your chest off of the floor while you keep your hips  on the floor. Keep the back of your head in line with the curve in your back. Look at the floor while you do this. 4. Stay in this position for 3-5 seconds. 5. Slowly lower your chest and your face to the floor. Contact a doctor if:  Your back pain gets a lot worse when you do an exercise.  Your back pain does not lessen 2 hours after you exercise. If you have any of these problems, stop doing the exercises. Do not do them again unless your doctor says it is okay. Get help right away if:  You have sudden, very bad back pain. If this happens, stop doing the exercises. Do not do them again unless your doctor says it is okay. This information is not intended to replace advice given to you by your health care provider. Make sure you discuss any questions you have with your health care provider. Document Released: 11/03/2010 Document Revised: 06/25/2018 Document Reviewed: 11/25/2014 Elsevier Interactive Patient Education  2019 [place] DASH stands for "Dietary Approaches to Stop Hypertension." The DASH eating plan is a healthy eating plan that has been shown to reduce high blood pressure (hypertension). It may also reduce your risk for type 2 diabetes, heart disease, and stroke. The DASH eating plan may also help with weight loss. What are tips for following this plan?  General guidelines  Avoid eating more than 2,300 mg (milligrams) of salt (sodium) a day. If you have hypertension, you may need to reduce your sodium intake to 1,500 mg a day.  Limit alcohol intake to no more than 1 drink a day for nonpregnant women and 2 drinks a day for men. One drink equals 12 oz of beer, 5 oz of wine, or 1 oz of hard liquor.  Work with your health care provider to maintain a healthy body weight or to lose weight. Ask what an ideal weight is for you.  Get at least 30 minutes of exercise that causes your heart to beat faster (aerobic exercise) most days of the week. Activities may  include walking, swimming, or biking.  Work with your health care provider or diet and nutrition specialist (dietitian) to adjust your eating plan to your individual calorie needs. Reading food labels   Check food labels for the amount of sodium per serving. Choose foods with less than 5 percent of the Daily Value of sodium. Generally, foods with less than 300 mg of sodium per serving fit into this eating plan.  To find whole grains, look for the word "whole"  as the first word in the ingredient list. Shopping  Buy products labeled as "low-sodium" or "no salt added."  Buy fresh foods. Avoid canned foods and premade or frozen meals. Cooking  Avoid adding salt when cooking. Use salt-free seasonings or herbs instead of table salt or sea salt. Check with your health care provider or pharmacist before using salt substitutes.  Do not fry foods. Cook foods using healthy methods such as baking, boiling, grilling, and broiling instead.  Cook with heart-healthy oils, such as olive, canola, soybean, or sunflower oil. Meal planning  Eat a balanced diet that includes: ? 5 or more servings of fruits and vegetables each day. At each meal, try to fill half of your plate with fruits and vegetables. ? Up to 6-8 servings of whole grains each day. ? Less than 6 oz of lean meat, poultry, or fish each day. A 3-oz serving of meat is about the same size as a deck of cards. One egg equals 1 oz. ? 2 servings of low-fat dairy each day. ? A serving of nuts, seeds, or beans 5 times each week. ? Heart-healthy fats. Healthy fats called Omega-3 fatty acids are found in foods such as flaxseeds and coldwater fish, like sardines, salmon, and mackerel.  Limit how much you eat of the following: ? Canned or prepackaged foods. ? Food that is high in trans fat, such as fried foods. ? Food that is high in saturated fat, such as fatty meat. ? Sweets, desserts, sugary drinks, and other foods with added sugar. ? Full-fat  dairy products.  Do not salt foods before eating.  Try to eat at least 2 vegetarian meals each week.  Eat more home-cooked food and less restaurant, buffet, and fast food.  When eating at a restaurant, ask that your food be prepared with less salt or no salt, if possible. What foods are recommended? The items listed may not be a complete list. Talk with your dietitian about what dietary choices are best for you. Grains Whole-grain or whole-wheat bread. Whole-grain or whole-wheat pasta. Brown rice. [place]. Bulgur. Whole-grain and low-sodium cereals. Pita bread. Low-fat, low-sodium crackers. Whole-wheat flour tortillas. Vegetables Fresh or frozen vegetables (raw, steamed, roasted, or grilled). Low-sodium or reduced-sodium tomato and vegetable juice. Low-sodium or reduced-sodium tomato sauce and tomato paste. Low-sodium or reduced-sodium canned vegetables. Fruits All fresh, dried, or frozen fruit. Canned fruit in natural juice (without added sugar). Meat and other protein foods Skinless chicken or [place]. Ground chicken or [place]. Pork with fat trimmed off. Fish and seafood. Egg whites. Dried beans, peas, or lentils. Unsalted nuts, nut butters, and seeds. Unsalted canned beans. Lean cuts of beef with fat trimmed off. Low-sodium, lean deli meat. Dairy Low-fat (1%) or fat-free (skim) milk. Fat-free, low-fat, or reduced-fat cheeses. Nonfat, low-sodium ricotta or cottage cheese. Low-fat or nonfat yogurt. Low-fat, low-sodium cheese. Fats and oils Soft margarine without trans fats. Vegetable oil. Low-fat, reduced-fat, or light mayonnaise and salad dressings (reduced-sodium). Canola, safflower, olive, soybean, and sunflower oils. Avocado. Seasoning and other foods Herbs. Spices. Seasoning mixes without salt. Unsalted popcorn and pretzels. Fat-free sweets. What foods are not recommended? The items listed may not be a complete list. Talk with your dietitian about what dietary choices are best  for you. Grains Baked goods made with fat, such as croissants, muffins, or some breads. Dry pasta or rice meal packs. Vegetables Creamed or fried vegetables. Vegetables in a cheese sauce. Regular canned vegetables (not low-sodium or reduced-sodium). Regular canned tomato sauce and paste (  not low-sodium or reduced-sodium). Regular tomato and vegetable juice (not low-sodium or reduced-sodium). Angie Fava. Olives. Fruits Canned fruit in a light or heavy syrup. Fried fruit. Fruit in cream or butter sauce. Meat and other protein foods Fatty cuts of meat. Ribs. Fried meat. Berniece Salines. Sausage. Bologna and other processed lunch meats. Salami. Fatback. Hotdogs. Bratwurst. Salted nuts and seeds. Canned beans with added salt. Canned or smoked fish. Whole eggs or egg yolks. Chicken or [place] with skin. Dairy Whole or 2% milk, cream, and half-and-half. Whole or full-fat cream cheese. Whole-fat or sweetened yogurt. Full-fat cheese. Nondairy creamers. Whipped toppings. Processed cheese and cheese spreads. Fats and oils Butter. Stick margarine. Lard. Shortening. Ghee. Bacon fat. Tropical oils, such as coconut, palm kernel, or palm oil. Seasoning and other foods Salted popcorn and pretzels. Onion salt, garlic salt, seasoned salt, table salt, and sea salt. Worcestershire sauce. Tartar sauce. Barbecue sauce. Teriyaki sauce. Soy sauce, including reduced-sodium. Steak sauce. Canned and packaged gravies. Fish sauce. Oyster sauce. Cocktail sauce. Horseradish that you find on the shelf. Ketchup. Mustard. Meat flavorings and tenderizers. Bouillon cubes. Hot sauce and Tabasco sauce. Premade or packaged marinades. Premade or packaged taco seasonings. Relishes. Regular salad dressings. Where to find more information:  National Heart, Lung, and [place]: https://wilson-eaton.com/  American Heart Association: www.heart.org Summary  The DASH eating plan is a healthy eating plan that has been shown to reduce high blood pressure  (hypertension). It may also reduce your risk for type 2 diabetes, heart disease, and stroke.  With the DASH eating plan, you should limit salt (sodium) intake to 2,300 mg a day. If you have hypertension, you may need to reduce your sodium intake to 1,500 mg a day.  When on the DASH eating plan, aim to eat more fresh fruits and vegetables, whole grains, lean proteins, low-fat dairy, and heart-healthy fats.  Work with your health care provider or diet and nutrition specialist (dietitian) to adjust your eating plan to your individual calorie needs. This information is not intended to replace advice given to you by your health care provider. Make sure you discuss any questions you have with your health care provider. Document Released: 09/20/2011 Document Revised: 09/24/2016 Document Reviewed: 09/24/2016 Elsevier Interactive Patient Education  2019 [employer].

## 2018-11-11 NOTE — Progress Notes (Signed)
Patient: Anthony Lam Male    DOB: 1966/03/16   53 y.o.   MRN: [id number] Visit Date: 11/11/2018  Today's Provider: Langston Reusing, NP   Chief Complaint  Patient presents with  . Follow-up    high blood pressure   Subjective:    HPI Anthony Lam 53 y/o male presents for follow up on uncontrolled hypertension. During his last visit on1/7/20, he stated that he will continue to take only 10 mg Amlodipine and 240 mg  Verapamil. During todays visit, he states that he takes10 mg Amlodipine and  25 mg Hctz daily. He admits to checking his blood pressure daily and forgets to bring the log to clinic and he admits to adhering to low salt diet. He denies chest pain, palpitation, light headedness, shortness of breath, peripheral edema. He reports that he smokes 1 pack of cigarette for 4-5 days and is working on quitting.  CKD stage 3: He reports seeing Anthony Lam, Nephrologist and he recommends him taking Vitamin D3 2000 units daily and Losartan 25 daily and he will follow up in 4-6 weeks and he request BMP in 2 weeks which will be drawn today.  Low back Pain: He reports that he continues to have intermittent non radiaiting dull 7/10  back pain that is mildly relieved with taking 1000 mg Tylenol as needed. He reports that he will complete charity care application for lower back x ray prior to follow up visit with Anthony Lam. Otherwise he reports doing well.      No Known Allergies Previous Medications   No medications on file    Review of Systems  Constitutional: Negative.   HENT: Negative.   Eyes: Negative.   Respiratory: Negative.   Cardiovascular: Negative.   Gastrointestinal: Negative.   Genitourinary: Negative.   Musculoskeletal: Positive for back pain (chronic back pain).  Skin: Negative.   Neurological: Negative.   Psychiatric/Behavioral: Negative.     Social History   Tobacco Use  . Smoking status: Light Tobacco Smoker    Packs/day: 0.25    Years: 15.00    Pack years:  3.75  . Smokeless tobacco: Never Used  Substance Use Topics  . Alcohol use: Not Currently    Comment: Not anymore   Objective:   BP (!) 140/105 (BP Location: Left Arm, Patient Position: Sitting, Cuff Size: Large)   Pulse 77   Ht _0  (1.753 m)   Wt 248 lb (112.5 kg)   SpO2 95%   BMI 36.62 kg/m   Physical Exam Constitutional:      Appearance: Normal appearance.  HENT:     Head: Normocephalic and atraumatic.     Mouth/Throat:     Mouth: Mucous membranes are moist.  Eyes:     Extraocular Movements: Extraocular movements intact.     Pupils: Pupils are equal, round, and reactive to light.  Neck:     Musculoskeletal: Normal range of motion.  Cardiovascular:     Rate and Rhythm: Normal rate and regular rhythm.     Pulses: Normal pulses.     Heart sounds: Normal heart sounds.  Pulmonary:     Effort: Pulmonary effort is normal.     Breath sounds: Normal breath sounds.  Abdominal:     General: Bowel sounds are normal.     Palpations: Abdomen is soft.  Musculoskeletal:        General: Tenderness (mild tenderness with flexion) present.  Skin:    General: Skin is warm and dry.  Neurological:  General: No focal deficit present.     Mental Status: He is alert and oriented to person, place, and time.  Psychiatric:        Mood and Affect: Mood normal.        Behavior: Behavior normal.        Thought Content: Thought content normal.        Judgment: Judgment normal.         Assessment & Plan:      1. Essential hypertension - [name]'s blood pressure today is improving, his goal is < 130/80, he was encouraged to continue on low salt diet, exercise 30 minutes daily and to lose weight. He was educated on the medication side effects and was advised to notify provider. - amLODipine (NORVASC) 10 MG tablet; Take 1 tablet (10 mg total) by mouth daily.  Dispense: 30 tablet; Refill: 3 - hydrochlorothiazide (HYDRODIURIL) 25 MG tablet; Take 1 tablet (25 mg total) by mouth daily.   Dispense: 30 tablet; Refill: 3 - losartan (COZAAR) 50 MG tablet; Take 0.5 tablets (25 mg total) by mouth daily.  Dispense: 30 tablet; Refill: 2  2. CKD (chronic kidney disease) stage 3, GFR 30-59 ml/min (HCC) - He will continue on medication regimen and was encouraged to complete charity care application so to follow up with Nephrologist. - Comp Met (CMET)  3. Chronic bilateral low back pain without sciatica - He agreed to complete charity care application for lower back x ray - Follow up with Anthony Lam  4. Health care maintenance - He states that he has not had colonoscopy, stool card was provided for colon cancer screening and HIV flyer provided. - He will start Vitamin D3 2000 units daily per Anthony Lam. - Follow up in 3 weeks       Derrica Sieg E Surena Welge, NP

## 2018-11-12 LAB — COMPREHENSIVE METABOLIC PANEL
ALT: 11 IU/L (ref 0–44)
AST: 12 IU/L (ref 0–40)
Albumin/Globulin Ratio: 1.4 (ref 1.2–2.2)
Albumin: 4.4 g/dL (ref 3.8–4.9)
Alkaline Phosphatase: 78 IU/L (ref 39–117)
BUN/Creatinine Ratio: 13 (ref 9–20)
BUN: 29 mg/dL — ABNORMAL HIGH (ref 6–24)
Bilirubin Total: 0.3 mg/dL (ref 0.0–1.2)
CO2: 27 mmol/L (ref 20–29)
CREATININE: 2.2 mg/dL — AB (ref 0.76–1.27)
Calcium: 10.1 mg/dL (ref 8.7–10.2)
Chloride: 97 mmol/L (ref 96–106)
GFR calc Af Amer: 38 mL/min/{1.73_m2} — ABNORMAL LOW (ref >59)
GFR calc non Af Amer: 33 mL/min/{1.73_m2} — ABNORMAL LOW (ref >59)
Globulin, Total: 3.1 g/dL (ref 1.5–4.5)
Glucose: 93 mg/dL (ref 65–99)
Potassium: 3.4 mmol/L — ABNORMAL LOW (ref 3.5–5.2)
Sodium: 140 mmol/L (ref 134–144)
Total Protein: 7.5 g/dL (ref 6.0–8.5)

## 2018-11-24 ENCOUNTER — Ambulatory Visit: Payer: Self-pay | Admitting: Pharmacy Technician

## 2018-11-24 DIAGNOSIS — Z79899 Other long term (current) drug therapy: Secondary | ICD-10-CM

## 2018-11-26 NOTE — Progress Notes (Signed)
Patient requested phone consult.  Eligibility screening conducted over the phone.  Read [facility]'s contract.  Patient verbally acknowledged that he understood and had no questions related to contract.  Patient has an appointment on 2/18 with [place].  Contract sent to [facility] for patient to sign.  A hard copy was mailed to patient as well.  Patient approved to receive medication assistance at [facility] as long as eligibility criteria continues to be met.  [facility].

## 2018-12-02 ENCOUNTER — Ambulatory Visit: Payer: Self-pay | Admitting: Gerontology

## 2018-12-11 ENCOUNTER — Encounter: Payer: Self-pay | Admitting: Gerontology

## 2018-12-11 ENCOUNTER — Other Ambulatory Visit: Payer: Self-pay

## 2018-12-11 ENCOUNTER — Ambulatory Visit: Payer: Self-pay | Admitting: Gerontology

## 2018-12-11 VITALS — BP 129/86 | HR 86 | Ht 69.0 in | Wt 248.3 lb

## 2018-12-11 DIAGNOSIS — Z0000 Encounter for general adult medical examination without abnormal findings: Secondary | ICD-10-CM

## 2018-12-11 DIAGNOSIS — N183 Chronic kidney disease, stage 3 unspecified: Secondary | ICD-10-CM

## 2018-12-11 DIAGNOSIS — I10 Essential (primary) hypertension: Secondary | ICD-10-CM

## 2018-12-11 DIAGNOSIS — M545 Low back pain: Secondary | ICD-10-CM

## 2018-12-11 DIAGNOSIS — G8929 Other chronic pain: Secondary | ICD-10-CM

## 2018-12-11 NOTE — Progress Notes (Signed)
Established Patient Office Visit  Subjective:  Patient ID: Anthony Lam, male    DOB: 04/01/1966  Age: 53 y.o. MRN: [id number]  CC:  Chief Complaint  Patient presents with  . Follow-up    high blood pressure    HPI Anthony Lam presents for follow up HTN, CKD stage 3., Chronic back pain.  HTN: He states that he takes 10 mg Amlodipine, 25 mg hydrochlorothiazide and 25 mg Losartan daily. He checks his blood pressure at home and forgot to bring log to appointment. He states that he has decreased his sodium intake. He denies headache, chest pain, palpitation, and peripheral edema.  CKD: He reports that he has a follow up appointment at [facility] with Dr Ardyth Man on 12/31/18. He states that he is yet to start taking Vitamin D3 2000 units daily.  Chronic back pain: He states that he's having intermittent 8/10 non radiating dull pain to his lower back . He reports that he takes 1000 mg tylenol every 8 hours with relief. He was encouraged to complete charity care application for [facility] health for spine x ray before a follow up appointment with Dr Vickki Hearing. Otherwise he denies no further concerns  Past Medical History:  Diagnosis Date  . Hypertension   . Migraines   . Seizures ([place])   . TB (tuberculosis), treated 2009    History reviewed. No pertinent surgical history.  Family History  Problem Relation Age of Onset  . Hypertension Mother   . Hypertension Father     Social History   Socioeconomic History  . Marital status: Single    Spouse name: Not on file  . Number of children: 2  . Years of education: Not on file  . Highest education level: 12th grade  Occupational History  . Not on file  Social Needs  . Financial resource strain: Very hard  . Food insecurity:    Worry: Sometimes true    Inability: Never true  . Transportation needs:    Medical: No    Non-medical: No  Tobacco Use  . Smoking status: Light Tobacco Smoker    Packs/day: 0.25    Years: 15.00   Pack years: 3.75  . Smokeless tobacco: Never Used  Substance and Sexual Activity  . Alcohol use: Not Currently    Comment: Not anymore  . Drug use: Never  . Sexual activity: Yes  Lifestyle  . Physical activity:    Days per week: Not on file    Minutes per session: Not on file  . Stress: Not on file  Relationships  . Social connections:    Talks on phone: Not on file    Gets together: Not on file    Attends religious service: Not on file    Active member of club or organization: Not on file    Attends meetings of clubs or organizations: Not on file    Relationship status: Not on file  . Intimate partner violence:    Fear of current or ex partner: Not on file    Emotionally abused: Not on file    Physically abused: Not on file    Forced sexual activity: Not on file  Other Topics Concern  . Not on file  Social History Narrative  . Not on file    Outpatient Medications Prior to Visit  Medication Sig Dispense Refill  . amLODipine (NORVASC) 10 MG tablet Take 1 tablet (10 mg total) by mouth daily. 30 tablet 3  . hydrochlorothiazide (HYDRODIURIL) 25  MG tablet Take 1 tablet (25 mg total) by mouth daily. 30 tablet 3  . losartan (COZAAR) 50 MG tablet Take 0.5 tablets (25 mg total) by mouth daily. 30 tablet 2   No facility-administered medications prior to visit.     No Known Allergies  ROS Review of Systems  Constitutional: Negative.   HENT: Negative.   Respiratory: Negative.   Cardiovascular: Negative.   Gastrointestinal: Negative.   Genitourinary: Negative.   Musculoskeletal: Back pain: chronic back pain.  Neurological: Negative.   Psychiatric/Behavioral: Negative.       Objective:    Physical Exam  Constitutional: He is oriented to person, place, and time. He appears well-developed and well-nourished.  HENT:  Head: Normocephalic and atraumatic.  Eyes: Pupils are equal, round, and reactive to light. EOM are normal.  Neck: Normal range of motion.  Cardiovascular:  Normal rate and regular rhythm.  Pulmonary/Chest: Effort normal and breath sounds normal.  Abdominal: Soft. Bowel sounds are normal.  Musculoskeletal: Normal range of motion.        General: Tenderness (pain to lower back when bending) present.  Neurological: He is alert and oriented to person, place, and time.  Skin: Skin is warm.  Psychiatric: He has a normal mood and affect. His behavior is normal. Judgment and thought content normal.    BP 129/86 (BP Location: Left Arm, Patient Position: Sitting, Cuff Size: Large)   Pulse 86   Ht '5\' 9"'$  (1.753 m)   Wt 248 lb 4.8 oz (112.6 kg)   SpO2 94%   BMI 36.67 kg/m  Wt Readings from Last 3 Encounters:  12/11/18 248 lb 4.8 oz (112.6 kg)  11/11/18 248 lb (112.5 kg)  10/21/18 249 lb 4.8 oz (113.1 kg)    Weight loss recommendation was advised, he was encouraged to decrease caloric intake, exercise 30 minutes daily.   Health Maintenance Due  Topic Date Due  . HIV Screening  03/30/1980  . TETANUS/TDAP  03/30/1984  . COLONOSCOPY  03/31/2015  . INFLUENZA VACCINE  05/15/2018   He states that he will return stool card for occult blood screening at next follow up visit.    Lab Results  Component Value Date   TSH 1.860 07/10/2018   Lab Results  Component Value Date   WBC 8.1 07/10/2018   HGB 15.0 07/10/2018   HCT 44.0 07/10/2018   MCV 82 07/10/2018   PLT 283 07/10/2018   Lab Results  Component Value Date   NA 140 11/11/2018   K 3.4 (L) 11/11/2018   CO2 27 11/11/2018   GLUCOSE 93 11/11/2018   BUN 29 (H) 11/11/2018   CREATININE 2.20 (H) 11/11/2018   BILITOT 0.3 11/11/2018   ALKPHOS 78 11/11/2018   AST 12 11/11/2018   ALT 11 11/11/2018   PROT 7.5 11/11/2018   ALBUMIN 4.4 11/11/2018   CALCIUM 10.1 11/11/2018   ANIONGAP 6 (L) 08/04/2013   Lab Results  Component Value Date   CHOL 162 07/10/2018   Lab Results  Component Value Date   HDL 50 07/10/2018   Lab Results  Component Value Date   LDLCALC 72 07/10/2018   Lab  Results  Component Value Date   TRIG 202 (H) 07/10/2018   He was advised to continue on low fat low cholesterol diet.  Lab Results  Component Value Date   CHOLHDL 3.2 07/10/2018   Lab Results  Component Value Date   HGBA1C 5.6 07/10/2018      Assessment & Plan:   Problem List Items  Addressed This Visit      Genitourinary   CKD (chronic kidney disease) stage 3, GFR 30-59 ml/min (HCC)   Relevant Orders   CBC w/Diff   Comp Met (CMET)   Urine Microalbumin w/creat. ratio     Other   Back pain    Other Visit Diagnoses    Essential hypertension    -  Primary   Relevant Orders   Comp Met (CMET)   Urine Microalbumin w/creat. ratio   Health care maintenance       Relevant Orders   HgB A1c   Lipid panel   Urinalysis     1. Essential hypertension - Mr Pepper's blood pressure has improved , it was 129/86 and his goal bp is < 130/80. He will continue on 10 mg Amlodipine, 25 mg hctz and 25 mg Losartan daily. - He will continue on low salt diet, exercise 30 minutes daily. Will collect routine labs at follow up appointment. - Comp Met (CMET); Future - Urine Microalbumin w/creat. ratio; Future  2. CKD (chronic kidney disease) stage 3, GFR 30-59 ml/min (HCC) - He was encouraged to follow up with [place] care appointment and will follow up with Dr Faythe Dingwall on 12/31/18 and was encouraged to start taking Vitamin D3 2000 units daily. - CBC w/Diff; Future - Comp Met (CMET); Future - Urine Microalbumin w/creat. ratio; Future  3. Chronic bilateral low back pain without sciatica - He was encouraged to complete [place] charity care for spine x ray before following up with Dr Vickki Hearing. He will continue to take 1000 mg tylenol every 8 hours for back pain.  4. Health care maintenance - He was encouraged to return stool card for occult blood screening. - Routine labds will be collected 1 week prior to office visit. - HgB A1c; Future - Lipid panel; Future - Urinalysis; Future  No  orders of the defined types were placed in this encounter.   Follow-up: Return in about 2 months (around 02/10/2019), or if symptoms worsen or fail to improve.    Slayter Moorhouse Jerold Coombe, NP

## 2018-12-11 NOTE — Patient Instructions (Signed)
DASH Eating Plan  DASH stands for "Dietary Approaches to Stop Hypertension." The DASH eating plan is a healthy eating plan that has been shown to reduce high blood pressure (hypertension). It may also reduce your risk for type 2 diabetes, heart disease, and stroke. The DASH eating plan may also help with weight loss.  What are tips for following this plan?    General guidelines   Avoid eating more than 2,300 mg (milligrams) of salt (sodium) a day. If you have hypertension, you may need to reduce your sodium intake to 1,500 mg a day.   Limit alcohol intake to no more than 1 drink a day for nonpregnant women and 2 drinks a day for men. One drink equals 12 oz of beer, 5 oz of wine, or 1 oz of hard liquor.   Work with your health care provider to maintain a healthy body weight or to lose weight. Ask what an ideal weight is for you.   Get at least 30 minutes of exercise that causes your heart to beat faster (aerobic exercise) most days of the week. Activities may include walking, swimming, or biking.   Work with your health care provider or diet and nutrition specialist (dietitian) to adjust your eating plan to your individual calorie needs.  Reading food labels     Check food labels for the amount of sodium per serving. Choose foods with less than 5 percent of the Daily Value of sodium. Generally, foods with less than 300 mg of sodium per serving fit into this eating plan.   To find whole grains, look for the word "whole" as the first word in the ingredient list.  Shopping   Buy products labeled as "low-sodium" or "no salt added."   Buy fresh foods. Avoid canned foods and premade or frozen meals.  Cooking   Avoid adding salt when cooking. Use salt-free seasonings or herbs instead of table salt or sea salt. Check with your health care provider or pharmacist before using salt substitutes.   Do not fry foods. Cook foods using healthy methods such as baking, boiling, grilling, and broiling instead.   Cook with  heart-healthy oils, such as olive, canola, soybean, or sunflower oil.  Meal planning   Eat a balanced diet that includes:  ? 5 or more servings of fruits and vegetables each day. At each meal, try to fill half of your plate with fruits and vegetables.  ? Up to 6-8 servings of whole grains each day.  ? Less than 6 oz of lean meat, poultry, or fish each day. A 3-oz serving of meat is about the same size as a deck of cards. One egg equals 1 oz.  ? 2 servings of low-fat dairy each day.  ? A serving of nuts, seeds, or beans 5 times each week.  ? Heart-healthy fats. Healthy fats called Omega-3 fatty acids are found in foods such as flaxseeds and coldwater fish, like sardines, salmon, and mackerel.   Limit how much you eat of the following:  ? Canned or prepackaged foods.  ? Food that is high in trans fat, such as fried foods.  ? Food that is high in saturated fat, such as fatty meat.  ? Sweets, desserts, sugary drinks, and other foods with added sugar.  ? Full-fat dairy products.   Do not salt foods before eating.   Try to eat at least 2 vegetarian meals each week.   Eat more home-cooked food and less restaurant, buffet, and fast food.     When eating at a restaurant, ask that your food be prepared with less salt or no salt, if possible.  What foods are recommended?  The items listed may not be a complete list. Talk with your dietitian about what dietary choices are best for you.  Grains  Whole-grain or whole-wheat bread. Whole-grain or whole-wheat pasta. Brown rice. Oatmeal. Quinoa. Bulgur. Whole-grain and low-sodium cereals. Pita bread. Low-fat, low-sodium crackers. Whole-wheat flour tortillas.  Vegetables  Fresh or frozen vegetables (raw, steamed, roasted, or grilled). Low-sodium or reduced-sodium tomato and vegetable juice. Low-sodium or reduced-sodium tomato sauce and tomato paste. Low-sodium or reduced-sodium canned vegetables.  Fruits  All fresh, dried, or frozen fruit. Canned fruit in natural juice (without  added sugar).  Meat and other protein foods  Skinless chicken or turkey. Ground chicken or turkey. Pork with fat trimmed off. Fish and seafood. Egg whites. Dried beans, peas, or lentils. Unsalted nuts, nut butters, and seeds. Unsalted canned beans. Lean cuts of beef with fat trimmed off. Low-sodium, lean deli meat.  Dairy  Low-fat (1%) or fat-free (skim) milk. Fat-free, low-fat, or reduced-fat cheeses. Nonfat, low-sodium ricotta or cottage cheese. Low-fat or nonfat yogurt. Low-fat, low-sodium cheese.  Fats and oils  Soft margarine without trans fats. Vegetable oil. Low-fat, reduced-fat, or light mayonnaise and salad dressings (reduced-sodium). Canola, safflower, olive, soybean, and sunflower oils. Avocado.  Seasoning and other foods  Herbs. Spices. Seasoning mixes without salt. Unsalted popcorn and pretzels. Fat-free sweets.  What foods are not recommended?  The items listed may not be a complete list. Talk with your dietitian about what dietary choices are best for you.  Grains  Baked goods made with fat, such as croissants, muffins, or some breads. Dry pasta or rice meal packs.  Vegetables  Creamed or fried vegetables. Vegetables in a cheese sauce. Regular canned vegetables (not low-sodium or reduced-sodium). Regular canned tomato sauce and paste (not low-sodium or reduced-sodium). Regular tomato and vegetable juice (not low-sodium or reduced-sodium). Pickles. Olives.  Fruits  Canned fruit in a light or heavy syrup. Fried fruit. Fruit in cream or butter sauce.  Meat and other protein foods  Fatty cuts of meat. Ribs. Fried meat. Bacon. Sausage. Bologna and other processed lunch meats. Salami. Fatback. Hotdogs. Bratwurst. Salted nuts and seeds. Canned beans with added salt. Canned or smoked fish. Whole eggs or egg yolks. Chicken or turkey with skin.  Dairy  Whole or 2% milk, cream, and half-and-half. Whole or full-fat cream cheese. Whole-fat or sweetened yogurt. Full-fat cheese. Nondairy creamers. Whipped toppings.  Processed cheese and cheese spreads.  Fats and oils  Butter. Stick margarine. Lard. Shortening. Ghee. Bacon fat. Tropical oils, such as coconut, palm kernel, or palm oil.  Seasoning and other foods  Salted popcorn and pretzels. Onion salt, garlic salt, seasoned salt, table salt, and sea salt. Worcestershire sauce. Tartar sauce. Barbecue sauce. Teriyaki sauce. Soy sauce, including reduced-sodium. Steak sauce. Canned and packaged gravies. Fish sauce. Oyster sauce. Cocktail sauce. Horseradish that you find on the shelf. Ketchup. Mustard. Meat flavorings and tenderizers. Bouillon cubes. Hot sauce and Tabasco sauce. Premade or packaged marinades. Premade or packaged taco seasonings. Relishes. Regular salad dressings.  Where to find more information:   National Heart, Lung, and Blood Institute: www.nhlbi.nih.gov   American Heart Association: www.heart.org  Summary   The DASH eating plan is a healthy eating plan that has been shown to reduce high blood pressure (hypertension). It may also reduce your risk for type 2 diabetes, heart disease, and stroke.   With the   DASH eating plan, you should limit salt (sodium) intake to 2,300 mg a day. If you have hypertension, you may need to reduce your sodium intake to 1,500 mg a day.   When on the DASH eating plan, aim to eat more fresh fruits and vegetables, whole grains, lean proteins, low-fat dairy, and heart-healthy fats.   Work with your health care provider or diet and nutrition specialist (dietitian) to adjust your eating plan to your individual calorie needs.  This information is not intended to replace advice given to you by your health care provider. Make sure you discuss any questions you have with your health care provider.  Document Released: 09/20/2011 Document Revised: 09/24/2016 Document Reviewed: 09/24/2016  Elsevier Interactive Patient Education  2019 [employer] Inc[employer]

## 2019-02-04 NOTE — Progress Notes (Signed)
Established Patient Office Visit  Subjective:  Patient ID: Anthony Lam, male    DOB: Nov 22, 1965  Age: 53 y.o. MRN: [id number]  CC:  Chief Complaint  Patient presents with  . Follow-up    hypertension    HPI Anthony Lam presents for follow up for hypertension and CKD  He reports that he stopped taking 25 mg hctz and 25 mg Losartan 2 weeks ago because he was experiencing dizziness and sexual dysfunction. He states that he was only taking 10 mg Amlodipine. He checks his blood pressure at home and has not checked blood pressure this morning. He reports that his blood pressure yesterday was 138/105. He states that he's compliant with low salt diet but not exercising. He denies chest pain, palpitation and peripheral edema.   He had a telephone visit with Dr Ardyth Man at [facility] on 01/28/2019 and he stated that renal function is stable and urine albumin/creatinine ratio 45.3ug/mg has improved with therapy.    He c/o constant dull 7/10 pain to lower back that radiates to right groin.He states that he noticed the radiation to his groin 3 weeks ago. He states that it feels like his right leg is giving out if he stands for more than 20 minutes. It also hurt when he bends to tie his shoe lace. He states that his back hurts at rest, sitting down or lying in bed and also when involved with any activity. He states that he takes 1000 mg tylenol every 8 hours as needed with minimal relief. He denies weakness to right leg, bladder or urinary incontinence and saddle anesthesia. He denies fever, chills and no further concerns.  Past Medical History:  Diagnosis Date  . Hypertension   . Migraines   . Seizures ([place])   . TB (tuberculosis), treated 2009    No past surgical history on file.  Family History  Problem Relation Age of Onset  . Hypertension Mother   . Hypertension Father     Social History   Socioeconomic History  . Marital status: Single    Spouse name: Not on file  . Number  of children: 2  . Years of education: Not on file  . Highest education level: 12th grade  Occupational History  . Not on file  Social Needs  . Financial resource strain: Very hard  . Food insecurity:    Worry: Sometimes true    Inability: Never true  . Transportation needs:    Medical: No    Non-medical: No  Tobacco Use  . Smoking status: Light Tobacco Smoker    Packs/day: 0.25    Years: 15.00    Pack years: 3.75  . Smokeless tobacco: Never Used  Substance and Sexual Activity  . Alcohol use: Not Currently    Comment: Not anymore  . Drug use: Never  . Sexual activity: Yes  Lifestyle  . Physical activity:    Days per week: Not on file    Minutes per session: Not on file  . Stress: Not on file  Relationships  . Social connections:    Talks on phone: Not on file    Gets together: Not on file    Attends religious service: Not on file    Active member of club or organization: Not on file    Attends meetings of clubs or organizations: Not on file    Relationship status: Not on file  . Intimate partner violence:    Fear of current or ex partner: Not on  file    Emotionally abused: Not on file    Physically abused: Not on file    Forced sexual activity: Not on file  Other Topics Concern  . Not on file  Social History Narrative  . Not on file    Outpatient Medications Prior to Visit  Medication Sig Dispense Refill  . amLODipine (NORVASC) 10 MG tablet Take 1 tablet (10 mg total) by mouth daily. 30 tablet 3  . losartan (COZAAR) 50 MG tablet Take 0.5 tablets (25 mg total) by mouth daily. (Patient not taking: Reported on 02/05/2019) 30 tablet 2  . hydrochlorothiazide (HYDRODIURIL) 25 MG tablet Take 1 tablet (25 mg total) by mouth daily. (Patient not taking: Reported on 02/05/2019) 30 tablet 3   No facility-administered medications prior to visit.     No Known Allergies  ROS Review of Systems  Constitutional: Negative.   HENT: Negative.   Respiratory: Negative.    Cardiovascular: Negative.   Gastrointestinal: Negative.   Genitourinary: Negative.   Musculoskeletal: Positive for back pain (chronic back pain).  Neurological: Positive for dizziness.  Psychiatric/Behavioral: Negative.       Objective:    Physical Exam  No vital sign or PE done There were no vitals taken for this visit. Wt Readings from Last 3 Encounters:  12/11/18 248 lb 4.8 oz (112.6 kg)  11/11/18 248 lb (112.5 kg)  10/21/18 249 lb 4.8 oz (113.1 kg)     Health Maintenance Due  Topic Date Due  . HIV Screening  03/30/1980  . TETANUS/TDAP  03/30/1984  . COLONOSCOPY  03/31/2015    There are no preventive care reminders to display for this patient.  Lab Results  Component Value Date   TSH 1.860 07/10/2018   Lab Results  Component Value Date   WBC 8.1 07/10/2018   HGB 15.0 07/10/2018   HCT 44.0 07/10/2018   MCV 82 07/10/2018   PLT 283 07/10/2018   Lab Results  Component Value Date   NA 140 11/11/2018   K 3.4 (L) 11/11/2018   CO2 27 11/11/2018   GLUCOSE 93 11/11/2018   BUN 29 (H) 11/11/2018   CREATININE 2.20 (H) 11/11/2018   BILITOT 0.3 11/11/2018   ALKPHOS 78 11/11/2018   AST 12 11/11/2018   ALT 11 11/11/2018   PROT 7.5 11/11/2018   ALBUMIN 4.4 11/11/2018   CALCIUM 10.1 11/11/2018   ANIONGAP 6 (L) 08/04/2013   Lab Results  Component Value Date   CHOL 162 07/10/2018   Lab Results  Component Value Date   HDL 50 07/10/2018   Lab Results  Component Value Date   LDLCALC 72 07/10/2018   Lab Results  Component Value Date   TRIG 202 (H) 07/10/2018   Lab Results  Component Value Date   CHOLHDL 3.2 07/10/2018   Lab Results  Component Value Date   HGBA1C 5.6 07/10/2018      Assessment & Plan:     1. Essential hypertension - Due to sexual dysfunction and dizziness, Hydrochlorothiazide 25 mg was discontinued, he states that he will not take it. - He was encouraged to continue on 25 mg Losartan and 10 mg Amlodipine. . -Low salt DASH  diet . Take medications regularly on time . Exercise regularly as tolerated . Check blood pressure at least once a day at home record and bring log in 2 weeks. . Goal is less than 140/90 and normal blood pressure is 120/80    2. CKD (chronic kidney disease) stage 3, GFR 30-59 ml/min (HCC) -  He will continue on current treatment regimen - Continue on current medications as prescribed. -Avoid NSAID for this can be tough on your kidney  3. Bilateral low back pain without sciatica, unspecified chronicity He was encouraged to call and schedule appointment with [facility]   NO REPORT; Future - Ambulatory referral to Orthopedic Surgery   Follow-up: Return in about 2 weeks (around 02/19/2019), or if symptoms worsen or fail to improve.    Demian Maisel Jerold Coombe, NP

## 2019-02-05 ENCOUNTER — Other Ambulatory Visit: Payer: Self-pay

## 2019-02-05 ENCOUNTER — Ambulatory Visit: Payer: Self-pay | Admitting: Gerontology

## 2019-02-05 DIAGNOSIS — M545 Low back pain, unspecified: Secondary | ICD-10-CM

## 2019-02-05 DIAGNOSIS — N183 Chronic kidney disease, stage 3 unspecified: Secondary | ICD-10-CM

## 2019-02-05 DIAGNOSIS — I10 Essential (primary) hypertension: Secondary | ICD-10-CM

## 2019-02-05 NOTE — Patient Instructions (Signed)
DASH Eating Plan DASH stands for "Dietary Approaches to Stop Hypertension." The DASH eating plan is a healthy eating plan that has been shown to reduce high blood pressure (hypertension). It may also reduce your risk for type 2 diabetes, heart disease, and stroke. The DASH eating plan may also help with weight loss. What are tips for following this plan?  General guidelines  Avoid eating more than 2,300 mg (milligrams) of salt (sodium) a day. If you have hypertension, you may need to reduce your sodium intake to 1,500 mg a day.  Limit alcohol intake to no more than 1 drink a day for nonpregnant women and 2 drinks a day for men. One drink equals 12 oz of beer, 5 oz of wine, or 1 oz of hard liquor.  Work with your health care provider to maintain a healthy body weight or to lose weight. Ask what an ideal weight is for you.  Get at least 30 minutes of exercise that causes your heart to beat faster (aerobic exercise) most days of the week. Activities may include walking, swimming, or biking.  Work with your health care provider or diet and nutrition specialist (dietitian) to adjust your eating plan to your individual calorie needs. Reading food labels   Check food labels for the amount of sodium per serving. Choose foods with less than 5 percent of the Daily Value of sodium. Generally, foods with less than 300 mg of sodium per serving fit into this eating plan.  To find whole grains, look for the word "whole" as the first word in the ingredient list. Shopping  Buy products labeled as "low-sodium" or "no salt added."  Buy fresh foods. Avoid canned foods and premade or frozen meals. Cooking  Avoid adding salt when cooking. Use salt-free seasonings or herbs instead of table salt or sea salt. Check with your health care provider or pharmacist before using salt substitutes.  Do not fry foods. Cook foods using healthy methods such as baking, boiling, grilling, and broiling instead.  Cook with  heart-healthy oils, such as olive, canola, soybean, or sunflower oil. Meal planning  Eat a balanced diet that includes: ? 5 or more servings of fruits and vegetables each day. At each meal, try to fill half of your plate with fruits and vegetables. ? Up to 6-8 servings of whole grains each day. ? Less than 6 oz of lean meat, poultry, or fish each day. A 3-oz serving of meat is about the same size as a deck of cards. One egg equals 1 oz. ? 2 servings of low-fat dairy each day. ? A serving of nuts, seeds, or beans 5 times each week. ? Heart-healthy fats. Healthy fats called Omega-3 fatty acids are found in foods such as flaxseeds and coldwater fish, like sardines, salmon, and mackerel.  Limit how much you eat of the following: ? Canned or prepackaged foods. ? Food that is high in trans fat, such as fried foods. ? Food that is high in saturated fat, such as fatty meat. ? Sweets, desserts, sugary drinks, and other foods with added sugar. ? Full-fat dairy products.  Do not salt foods before eating.  Try to eat at least 2 vegetarian meals each week.  Eat more home-cooked food and less restaurant, buffet, and fast food.  When eating at a restaurant, ask that your food be prepared with less salt or no salt, if possible. What foods are recommended? The items listed may not be a complete list. Talk with your dietitian about   what dietary choices are best for you. Grains Whole-grain or whole-wheat bread. Whole-grain or whole-wheat pasta. Brown rice. Oatmeal. Quinoa. Bulgur. Whole-grain and low-sodium cereals. Pita bread. Low-fat, low-sodium crackers. Whole-wheat flour tortillas. Vegetables Fresh or frozen vegetables (raw, steamed, roasted, or grilled). Low-sodium or reduced-sodium tomato and vegetable juice. Low-sodium or reduced-sodium tomato sauce and tomato paste. Low-sodium or reduced-sodium canned vegetables. Fruits All fresh, dried, or frozen fruit. Canned fruit in natural juice (without  added sugar). Meat and other protein foods Skinless chicken or turkey. Ground chicken or turkey. Pork with fat trimmed off. Fish and seafood. Egg whites. Dried beans, peas, or lentils. Unsalted nuts, nut butters, and seeds. Unsalted canned beans. Lean cuts of beef with fat trimmed off. Low-sodium, lean deli meat. Dairy Low-fat (1%) or fat-free (skim) milk. Fat-free, low-fat, or reduced-fat cheeses. Nonfat, low-sodium ricotta or cottage cheese. Low-fat or nonfat yogurt. Low-fat, low-sodium cheese. Fats and oils Soft margarine without trans fats. Vegetable oil. Low-fat, reduced-fat, or light mayonnaise and salad dressings (reduced-sodium). Canola, safflower, olive, soybean, and sunflower oils. Avocado. Seasoning and other foods Herbs. Spices. Seasoning mixes without salt. Unsalted popcorn and pretzels. Fat-free sweets. What foods are not recommended? The items listed may not be a complete list. Talk with your dietitian about what dietary choices are best for you. Grains Baked goods made with fat, such as croissants, muffins, or some breads. Dry pasta or rice meal packs. Vegetables Creamed or fried vegetables. Vegetables in a cheese sauce. Regular canned vegetables (not low-sodium or reduced-sodium). Regular canned tomato sauce and paste (not low-sodium or reduced-sodium). Regular tomato and vegetable juice (not low-sodium or reduced-sodium). Pickles. Olives. Fruits Canned fruit in a light or heavy syrup. Fried fruit. Fruit in cream or butter sauce. Meat and other protein foods Fatty cuts of meat. Ribs. Fried meat. Bacon. Sausage. Bologna and other processed lunch meats. Salami. Fatback. Hotdogs. Bratwurst. Salted nuts and seeds. Canned beans with added salt. Canned or smoked fish. Whole eggs or egg yolks. Chicken or turkey with skin. Dairy Whole or 2% milk, cream, and half-and-half. Whole or full-fat cream cheese. Whole-fat or sweetened yogurt. Full-fat cheese. Nondairy creamers. Whipped toppings.  Processed cheese and cheese spreads. Fats and oils Butter. Stick margarine. Lard. Shortening. Ghee. Bacon fat. Tropical oils, such as coconut, palm kernel, or palm oil. Seasoning and other foods Salted popcorn and pretzels. Onion salt, garlic salt, seasoned salt, table salt, and sea salt. Worcestershire sauce. Tartar sauce. Barbecue sauce. Teriyaki sauce. Soy sauce, including reduced-sodium. Steak sauce. Canned and packaged gravies. Fish sauce. Oyster sauce. Cocktail sauce. Horseradish that you find on the shelf. Ketchup. Mustard. Meat flavorings and tenderizers. Bouillon cubes. Hot sauce and Tabasco sauce. Premade or packaged marinades. Premade or packaged taco seasonings. Relishes. Regular salad dressings. Where to find more information:  National Heart, Lung, and Blood Institute: www.nhlbi.nih.gov  American Heart Association: www.heart.org Summary  The DASH eating plan is a healthy eating plan that has been shown to reduce high blood pressure (hypertension). It may also reduce your risk for type 2 diabetes, heart disease, and stroke.  With the DASH eating plan, you should limit salt (sodium) intake to 2,300 mg a day. If you have hypertension, you may need to reduce your sodium intake to 1,500 mg a day.  When on the DASH eating plan, aim to eat more fresh fruits and vegetables, whole grains, lean proteins, low-fat dairy, and heart-healthy fats.  Work with your health care provider or diet and nutrition specialist (dietitian) to adjust your eating plan to your   individual calorie needs. This information is not intended to replace advice given to you by your health care provider. Make sure you discuss any questions you have with your health care provider. Document Released: 09/20/2011 Document Revised: 09/24/2016 Document Reviewed: 09/24/2016 Elsevier Interactive Patient Education  2019 [employer] Low Back Sprain  A sprain is a stretch or tear in the bands of tissue that hold bones and  joints together (ligaments). Sprains of the lower back (lumbar spine) are a common cause of low back pain. A sprain occurs when ligaments are overextended or stretched beyond their limits. The ligaments can become inflamed, resulting in pain and sudden muscle tightening (spasms). A sprain can be caused by an injury (trauma), or it can develop gradually due to overuse. There are three types of sprains:  Grade 1 is a mild sprain involving an overstretched ligament or a very slight tear of the ligament.  Grade 2 is a moderate sprain involving a partial tear of the ligament.  Grade 3 is a severe sprain involving a complete tear of the ligament. What are the causes? This condition may be caused by:  Trauma, such as a fall or a hit to the body.  Twisting or overstretching the back. This may result from doing activities that require a lot of energy, such as lifting heavy objects. What increases the risk? The following factors may increase your risk of getting this condition:  Playing contact sports.  Participating in sports or activities that put excessive stress on the back and require a lot of bending and twisting, including: ? Lifting weights or heavy objects. ? Gymnastics. ? Soccer. ? Figure skating. ? Snowboarding.  Being overweight or obese.  Having poor strength and flexibility. What are the signs or symptoms? Symptoms of this condition may include:  Sharp or dull pain in the lower back that does not go away. Pain may extend to the buttocks.  Stiffness.  Limited range of motion.  Inability to stand up straight due to stiffness or pain.  Muscle spasms. How is this diagnosed? This condition may be diagnosed based on:  Your symptoms.  Your medical history.  A physical exam. ? Your health care provider may push on certain areas of your back to determine the source of your pain. ? You may be asked to bend forward, backward, and side to side to assess the severity of your  pain and your range of motion.  Imaging tests, such as: ? X-rays. ? MRI. How is this treated? Treatment for this condition may include:  Applying heat and cold to the affected area.  Medicines to help relieve pain and to relax your muscles (muscle relaxants).  NSAIDs to help reduce swelling and discomfort.  Physical therapy. When your symptoms improve, it is important to gradually return to your normal routine as soon as possible to reduce pain, avoid stiffness, and avoid loss of muscle strength. Generally, symptoms should improve within 6 weeks of treatment. However, recovery time varies. Follow these instructions at home: Managing pain, stiffness, and swelling   If directed, apply ice to the injured area during the first 24 hours after your injury. ? Put ice in a plastic bag. ? Place a towel between your skin and the bag. ? Leave the ice on for 20 minutes, 2-3 times a day.  If directed, apply heat to the affected area as often as told by your health care provider. Use the heat source that your health care provider recommends, such as a moist heat  pack or a heating pad. ? Place a towel between your skin and the heat source. ? Leave the heat on for 20-30 minutes. ? Remove the heat if your skin turns bright red. This is especially important if you are unable to feel pain, heat, or cold. You may have a greater risk of getting burned. Activity  Rest and return to your normal activities as told by your health care provider. Ask your health care provider what activities are safe for you.  Avoid activities that take a lot of effort (are strenuous) for as long as told by your health care provider.  Do exercises as told by your health care provider. General instructions   Take over-the-counter and prescription medicines only as told by your health care provider.  If you have questions or concerns about safety while taking pain medicine, talk with your health care provider.  Do not  drive or operate heavy machinery until you know how your pain medicine affects you.  Do not use any tobacco products, such as cigarettes, chewing tobacco, and e-cigarettes. Tobacco can delay bone healing. If you need help quitting, ask your health care provider.  Keep all follow-up visits as told by your health care provider. This is important. How is this prevented?  Warm up and stretch before being active.  Cool down and stretch after being active.  Give your body time to rest between periods of activity.  Avoid: ? Being physically inactive for long periods at a time. ? Exercising or playing sports when you are tired or in pain.  Use correct form when playing sports and lifting heavy objects.  Use good posture when sitting and standing.  Maintain a healthy weight.  Sleep on a mattress with medium firmness to support your back.  Make sure to use equipment that fits you, including shoes that fit well.  Be safe and responsible while being active to avoid falls.  Do at least 150 minutes of moderate-intensity exercise each week, such as brisk walking or water aerobics. Try a form of exercise that takes stress off your back, such as swimming or stationary cycling.  Maintain physical fitness, including: ? Strength. In particular, develop and maintain strong abdominal muscles. ? Flexibility. ? Cardiovascular fitness. ? Endurance. Contact a health care provider if:  Your back pain does not improve after 6 weeks of treatment.  Your symptoms get worse. Get help right away if:  Your back pain is severe.  You are unable to stand or walk.  You develop pain in your legs.  You develop weakness in your buttocks or legs.  You have difficulty controlling when you urinate or when you have a bowel movement. This information is not intended to replace advice given to you by your health care provider. Make sure you discuss any questions you have with your health care provider.  Document Released: 10/01/2005 Document Revised: 06/07/2016 Document Reviewed: 07/13/2015 Elsevier Interactive Patient Education  2019 [employer].

## 2019-02-12 ENCOUNTER — Ambulatory Visit: Payer: Self-pay | Admitting: Gerontology

## 2019-02-19 ENCOUNTER — Other Ambulatory Visit: Payer: Self-pay

## 2019-02-19 ENCOUNTER — Encounter: Payer: Self-pay | Admitting: Gerontology

## 2019-02-19 ENCOUNTER — Ambulatory Visit: Payer: Self-pay | Admitting: Gerontology

## 2019-02-19 VITALS — BP 141/101

## 2019-02-19 DIAGNOSIS — M545 Low back pain, unspecified: Secondary | ICD-10-CM

## 2019-02-19 DIAGNOSIS — I10 Essential (primary) hypertension: Secondary | ICD-10-CM

## 2019-02-19 DIAGNOSIS — G4730 Sleep apnea, unspecified: Secondary | ICD-10-CM

## 2019-02-19 MED ORDER — DICLOFENAC SODIUM 1 % TD GEL: 2.0000 g | Freq: Four times a day (QID) | TRANSDERMAL | Status: DC

## 2019-02-19 NOTE — Patient Instructions (Signed)

## 2019-02-19 NOTE — Progress Notes (Signed)
Established Patient Office Visit  Subjective:  Patient ID: Anthony Lam, male    DOB: Sep 08, 1966  Age: 53 y.o. MRN: [id number]  CC:  Chief Complaint  Patient presents with  . Follow-up    high blood pressure   Patient consents to telephone visit and 2 patient identifier was used to identify patient.  HPI Anthony Lam presents for follow up for hypertension and chronic low back pain He is currently taking Amlodipine 10 mg and Losartan 25 mg daily. He checks his blood pressure at home and it was 141/101 and HR 92.He states that he's working on adhering to low sodium diet and exercise. He reports having occasional dizziness when he gets up quickly, but denies vertigo, blurry vision and  peripheral edema. He had a virtual visit with Dr Ardyth Man  [facility] on 02/09/19, he was encouraged to continue to take Losartan on most days , though patient states that he will like to skip a dose on the day he plans to have sex.  He continues to experience intermittent non radiating 7/10 pain to lower back and will follow up with [facility]. He denies incontinence, weakness to legs and groin anesthesia.  He c/o having difficulty staying asleep, snoring and that he was told by his girlfriend that he stops breathing sometimes while he's asleep. He denies fever, chills and no further concerns.   Past Medical History:  Diagnosis Date  . Hypertension   . Migraines   . Seizures ([place])   . TB (tuberculosis), treated 2009    History reviewed. No pertinent surgical history.  Family History  Problem Relation Age of Onset  . Hypertension Mother   . Hypertension Father     Social History   Socioeconomic History  . Marital status: Single    Spouse name: Not on file  . Number of children: 2  . Years of education: Not on file  . Highest education level: 12th grade  Occupational History  . Not on file  Social Needs  . Financial resource strain: Very hard  . Food insecurity:    Worry:  Sometimes true    Inability: Never true  . Transportation needs:    Medical: No    Non-medical: No  Tobacco Use  . Smoking status: Light Tobacco Smoker    Packs/day: 0.25    Years: 15.00    Pack years: 3.75  . Smokeless tobacco: Never Used  Substance and Sexual Activity  . Alcohol use: Not Currently    Comment: Not anymore  . Drug use: Never  . Sexual activity: Yes  Lifestyle  . Physical activity:    Days per week: Not on file    Minutes per session: Not on file  . Stress: Not on file  Relationships  . Social connections:    Talks on phone: Not on file    Gets together: Not on file    Attends religious service: Not on file    Active member of club or organization: Not on file    Attends meetings of clubs or organizations: Not on file    Relationship status: Not on file  . Intimate partner violence:    Fear of current or ex partner: Not on file    Emotionally abused: Not on file    Physically abused: Not on file    Forced sexual activity: Not on file  Other Topics Concern  . Not on file  Social History Narrative  . Not on file  Outpatient Medications Prior to Visit  Medication Sig Dispense Refill  . amLODipine (NORVASC) 10 MG tablet Take 1 tablet (10 mg total) by mouth daily. 30 tablet 3  . losartan (COZAAR) 50 MG tablet Take 0.5 tablets (25 mg total) by mouth daily. 30 tablet 2   No facility-administered medications prior to visit.     No Known Allergies  ROS Review of Systems  Constitutional: Negative.   HENT: Negative.   Eyes: Negative.   Respiratory: Negative.   Cardiovascular: Negative.   Genitourinary: Negative.   Musculoskeletal: Positive for back pain (chronic back pain).  Skin: Negative.   Neurological: Positive for dizziness (intermittent dizziness).  Psychiatric/Behavioral: Positive for sleep disturbance.      Objective:    Physical Exam   No vital sign or PE was done.  BP (!) 141/101 Comment: pt stated Wt Readings from Last 3  Encounters:  12/11/18 248 lb 4.8 oz (112.6 kg)  11/11/18 248 lb (112.5 kg)  10/21/18 249 lb 4.8 oz (113.1 kg)     Health Maintenance Due  Topic Date Due  . HIV Screening  03/30/1980  . TETANUS/TDAP  03/30/1984  . COLONOSCOPY  03/31/2015    There are no preventive care reminders to display for this patient.  Lab Results  Component Value Date   TSH 1.860 07/10/2018   Lab Results  Component Value Date   WBC 8.1 07/10/2018   HGB 15.0 07/10/2018   HCT 44.0 07/10/2018   MCV 82 07/10/2018   PLT 283 07/10/2018   Lab Results  Component Value Date   NA 140 11/11/2018   K 3.4 (L) 11/11/2018   CO2 27 11/11/2018   GLUCOSE 93 11/11/2018   BUN 29 (H) 11/11/2018   CREATININE 2.20 (H) 11/11/2018   BILITOT 0.3 11/11/2018   ALKPHOS 78 11/11/2018   AST 12 11/11/2018   ALT 11 11/11/2018   PROT 7.5 11/11/2018   ALBUMIN 4.4 11/11/2018   CALCIUM 10.1 11/11/2018   ANIONGAP 6 (L) 08/04/2013   Lab Results  Component Value Date   CHOL 162 07/10/2018   Lab Results  Component Value Date   HDL 50 07/10/2018   Lab Results  Component Value Date   LDLCALC 72 07/10/2018   Lab Results  Component Value Date   TRIG 202 (H) 07/10/2018   Lab Results  Component Value Date   CHOLHDL 3.2 07/10/2018   Lab Results  Component Value Date   HGBA1C 5.6 07/10/2018      Assessment & Plan:    1. Essential hypertension - Uncontrolled blood pressure due to non compliance with treatment regimen. - Goal blood pressure is , 130/80 due to CKD stage 3. - He will continue on current treatment regimen. He was advised to continue taking Losartan 25 mg, though he reports that he will skip the days he's having sex. -Low salt DASH diet -Take medications regularly on time -Exercise regularly as tolerated -Check blood pressure at least once a day at home  and record   2. Bilateral low back pain without sciatica, unspecified chronicity - He was encouraged to complete charity care application for  back x ray. -He will follow up at [facility] - diclofenac sodium (VOLTAREN) 1 % transdermal gel 2 g - He was advised to go to the nearest emergency room for weakness and numbness to legs, bladder or bowel incontinence , nausea and vomiting.  3. Sleep apnea, unspecified type - He was advised to keep a sleep diary and will follow up at  [facility] for - [facility] Sleep Study;     Follow-up: Return in about 2 weeks (around 03/05/2019), or if symptoms worsen or fail to improve.    Yunuen Mordan Jerold Coombe, NP

## 2019-02-26 ENCOUNTER — Other Ambulatory Visit: Payer: Self-pay

## 2019-02-26 MED ORDER — DICLOFENAC SODIUM 1 % TD GEL: 2.0000 g | Freq: Four times a day (QID) | TRANSDERMAL | 0 refills | Status: AC

## 2019-03-11 ENCOUNTER — Other Ambulatory Visit: Payer: Self-pay

## 2019-03-11 ENCOUNTER — Ambulatory Visit: Payer: Self-pay | Admitting: Gerontology

## 2019-03-11 DIAGNOSIS — M545 Low back pain, unspecified: Secondary | ICD-10-CM

## 2019-03-11 DIAGNOSIS — G4730 Sleep apnea, unspecified: Secondary | ICD-10-CM

## 2019-03-11 DIAGNOSIS — I10 Essential (primary) hypertension: Secondary | ICD-10-CM

## 2019-03-11 MED ORDER — LOSARTAN POTASSIUM 50 MG PO TABS: 12.5000 mg | ORAL_TABLET | Freq: Every day | ORAL | 1 refills | Status: DC

## 2019-03-11 MED ORDER — LOSARTAN POTASSIUM 25 MG PO TABS: 12.5000 mg | ORAL_TABLET | Freq: Every day | ORAL | 0 refills | Status: DC

## 2019-03-11 NOTE — Progress Notes (Signed)
Established Patient Office Visit  Subjective:  Patient ID: Anthony Lam, male    DOB: 05-12-1966  Age: 53 y.o. MRN: [id number]  CC:  Chief Complaint  Patient presents with  . Follow-up    high blood pressure  Patient consents to telephone visit and two patient identifiers was used to identify patient.  HPI [name] presents for follow up for hypertension and chronic low back pain. He is currently taking Amlodipine 10 mg and he states that taking Losartan 25 mg was "giving him some reaction such as having no interest in any activity and does not want to be bothered". He reports talking to a family member whose was a physician and was told that 25 mg of Losartan might be a higher dose for him since his kidney function is improving. He stopped taking his 25 mg Losartan on 03/08/19, and he requests that the dose be decreased to 12.5 mg. He checks his blood pressure at home and the lowest reading prior to telephone visit was 149/106.He states that he's working on adhering to low sodium diet and exercise. He denies dizziness, chest pain, palpitation and peripheral edema.   He continues to experience intermittent non radiating 7/10 pain to lower back and will follow up with [facility]. He denies incontinence, weakness to legs and groin anesthesia.             He continues to experience difficulty staying asleep, snoring and he states that his girlfriend reports that he has intermittent apnea while he's sleeping. He denies fever, chills and no further concerns.   Past Medical History:  Diagnosis Date  . Hypertension   . Migraines   . Seizures ([place])   . TB (tuberculosis), treated 2009    History reviewed. No pertinent surgical history.  Family History  Problem Relation Age of Onset  . Hypertension Mother   . Hypertension Father     Social History   Socioeconomic History  . Marital status: Single    Spouse name: Not on file  . Number of children: 2  . Years of  education: Not on file  . Highest education level: 12th grade  Occupational History  . Not on file  Social Needs  . Financial resource strain: Very hard  . Food insecurity:    Worry: Sometimes true    Inability: Never true  . Transportation needs:    Medical: No    Non-medical: No  Tobacco Use  . Smoking status: Light Tobacco Smoker    Packs/day: 0.25    Years: 15.00    Pack years: 3.75  . Smokeless tobacco: Never Used  Substance and Sexual Activity  . Alcohol use: Not Currently    Comment: Not anymore  . Drug use: Never  . Sexual activity: Yes  Lifestyle  . Physical activity:    Days per week: Not on file    Minutes per session: Not on file  . Stress: Not on file  Relationships  . Social connections:    Talks on phone: Not on file    Gets together: Not on file    Attends religious service: Not on file    Active member of club or organization: Not on file    Attends meetings of clubs or organizations: Not on file    Relationship status: Not on file  . Intimate partner violence:    Fear of current or ex partner: Not on file    Emotionally abused: Not on file  Physically abused: Not on file    Forced sexual activity: Not on file  Other Topics Concern  . Not on file  Social History Narrative  . Not on file    Outpatient Medications Prior to Visit  Medication Sig Dispense Refill  . amLODipine (NORVASC) 10 MG tablet Take 1 tablet (10 mg total) by mouth daily. 30 tablet 3  . diclofenac sodium (VOLTAREN) 1 % GEL Apply 2 g topically 4 (four) times daily for 30 days. 1 Tube 0  . losartan (COZAAR) 50 MG tablet Take 0.5 tablets (25 mg total) by mouth daily. 30 tablet 2   No facility-administered medications prior to visit.     No Known Allergies  ROS Review of Systems  Constitutional: Negative.   HENT: Negative.   Respiratory: Negative.   Cardiovascular: Negative.   Gastrointestinal: Negative.   Genitourinary: Negative.   Musculoskeletal: Positive for back  pain (chronic lower back pain).  Skin: Negative.   Neurological: Negative.   Psychiatric/Behavioral: Positive for sleep disturbance.      Objective:    Physical Exam No vital sign or PE done There were no vitals taken for this visit. Wt Readings from Last 3 Encounters:  12/11/18 248 lb 4.8 oz (112.6 kg)  11/11/18 248 lb (112.5 kg)  10/21/18 249 lb 4.8 oz (113.1 kg)     Health Maintenance Due  Topic Date Due  . HIV Screening  03/30/1980  . TETANUS/TDAP  03/30/1984  . COLONOSCOPY  03/31/2015    There are no preventive care reminders to display for this patient.  Lab Results  Component Value Date   TSH 1.860 07/10/2018   Lab Results  Component Value Date   WBC 8.1 07/10/2018   HGB 15.0 07/10/2018   HCT 44.0 07/10/2018   MCV 82 07/10/2018   PLT 283 07/10/2018   Lab Results  Component Value Date   NA 140 11/11/2018   K 3.4 (L) 11/11/2018   CO2 27 11/11/2018   GLUCOSE 93 11/11/2018   BUN 29 (H) 11/11/2018   CREATININE 2.20 (H) 11/11/2018   BILITOT 0.3 11/11/2018   ALKPHOS 78 11/11/2018   AST 12 11/11/2018   ALT 11 11/11/2018   PROT 7.5 11/11/2018   ALBUMIN 4.4 11/11/2018   CALCIUM 10.1 11/11/2018   ANIONGAP 6 (L) 08/04/2013   Lab Results  Component Value Date   CHOL 162 07/10/2018   Lab Results  Component Value Date   HDL 50 07/10/2018   Lab Results  Component Value Date   LDLCALC 72 07/10/2018   Lab Results  Component Value Date   TRIG 202 (H) 07/10/2018   Lab Results  Component Value Date   CHOLHDL 3.2 07/10/2018   Lab Results  Component Value Date   HGBA1C 5.6 07/10/2018      Assessment & Plan:     1. Essential hypertension - Uncontrolled hypertension due to non compliance. His goal blood pressure is <130/80 due to CKD stage 3.  -Losartan was decreased to 12.5 mg and he was advised to check , document blood pressure and bring to visit. - losartan (COZAAR) 25 MG tablet; Take 0.5 tablets (12.5 mg total) by mouth daily.  Dispense: 30  tablet; Refill: 0 -Low salt DASH diet -Take medications regularly on time -Exercise regularly as tolerated  2. Bilateral low back pain without sciatica, unspecified chronicity - He was encouraged to complete charity care application for back x ray and call and schedule appointment with [facility] - diclofenac sodium (VOLTAREN) 1 %  transdermal gel 2 g - He was advised to go to the nearest emergency room for weakness and numbness to legs, bladder or bowel incontinence , nausea and vomiting.   3. Sleep apnea, unspecified type - He was advised to call and schedule appointment with [facility] for-PSG sleep study.   Follow-up: Return in about 2 weeks (around 03/25/2019), or if symptoms worsen or fail to improve.    Minoru Chap Jerold Coombe, NP

## 2019-03-16 ENCOUNTER — Other Ambulatory Visit: Payer: Self-pay | Admitting: Gerontology

## 2019-03-16 DIAGNOSIS — I10 Essential (primary) hypertension: Secondary | ICD-10-CM

## 2019-03-17 ENCOUNTER — Other Ambulatory Visit: Payer: Self-pay

## 2019-03-17 DIAGNOSIS — I10 Essential (primary) hypertension: Secondary | ICD-10-CM

## 2019-03-17 MED ORDER — AMLODIPINE BESYLATE 10 MG PO TABS: 10.0000 mg | ORAL_TABLET | Freq: Every day | ORAL | 3 refills | Status: DC

## 2019-05-19 ENCOUNTER — Other Ambulatory Visit: Payer: Self-pay

## 2019-05-19 ENCOUNTER — Ambulatory Visit: Payer: Self-pay | Admitting: Gerontology

## 2019-05-19 ENCOUNTER — Encounter: Payer: Self-pay | Admitting: Gerontology

## 2019-05-19 VITALS — BP 154/114 | HR 86 | Ht 69.0 in | Wt 270.0 lb

## 2019-05-19 DIAGNOSIS — G8929 Other chronic pain: Secondary | ICD-10-CM | POA: Insufficient documentation

## 2019-05-19 DIAGNOSIS — G4730 Sleep apnea, unspecified: Secondary | ICD-10-CM

## 2019-05-19 DIAGNOSIS — M545 Low back pain, unspecified: Secondary | ICD-10-CM

## 2019-05-19 DIAGNOSIS — I10 Essential (primary) hypertension: Secondary | ICD-10-CM | POA: Insufficient documentation

## 2019-05-19 MED ORDER — LOSARTAN POTASSIUM 25 MG PO TABS: 25.0000 mg | ORAL_TABLET | Freq: Every day | ORAL | 1 refills | Status: DC

## 2019-05-19 NOTE — Progress Notes (Signed)
Established Patient Office Visit  Subjective:  Patient ID: Anthony Lam, male    DOB: 1966-02-07  Age: 53 y.o. MRN: [id number]  CC:  Chief Complaint  Patient presents with  . Follow-up    high blood pressure    HPI Anthony Lam presents for follow up of hypertension and chronic back pain. He continues with Telehealth Physical Therapy sessions at [facility] for his chronic low back pain. He reports that he continues to experience non radiating dull pain to lower back when standing or sitting for more than 10 minutes. He states that taking 100 mg Gabapentin offers some relief. He denies motor weakness, bowel or bladder incontinence and saddle anesthesia.  Currently, he reports of having elevated blood pressure. He continues to take10 mg Amlodipine and 12.5 mg Losartan daily and he requests a dose increase of his blood pressure medication. He reports that he gained over 20 pounds, and continues to work on his diet and exercising as tolerated. He states that he continues to snore and experience intermittent apnea. He denies chest pain, palpitation, fever, chills and no further concerns.  Past Medical History:  Diagnosis Date  . Hypertension   . Migraines   . Seizures ([place])   . TB (tuberculosis), treated 2009    No past surgical history on file.  Family History  Problem Relation Age of Onset  . Hypertension Mother   . Hypertension Father     Social History   Socioeconomic History  . Marital status: Single    Spouse name: Not on file  . Number of children: 2  . Years of education: Not on file  . Highest education level: 12th grade  Occupational History  . Not on file  Social Needs  . Financial resource strain: Very hard  . Food insecurity    Worry: Sometimes true    Inability: Never true  . Transportation needs    Medical: No    Non-medical: No  Tobacco Use  . Smoking status: Light Tobacco Smoker    Packs/day: 0.25    Years: 15.00    Pack years: 3.75  . Smokeless  tobacco: Never Used  Substance and Sexual Activity  . Alcohol use: Not Currently    Comment: Not anymore  . Drug use: Never  . Sexual activity: Yes  Lifestyle  . Physical activity    Days per week: Not on file    Minutes per session: Not on file  . Stress: Not on file  Relationships  . Social Herbalist on phone: Not on file    Gets together: Not on file    Attends religious service: Not on file    Active member of club or organization: Not on file    Attends meetings of clubs or organizations: Not on file    Relationship status: Not on file  . Intimate partner violence    Fear of current or ex partner: Not on file    Emotionally abused: Not on file    Physically abused: Not on file    Forced sexual activity: Not on file  Other Topics Concern  . Not on file  Social History Narrative  . Not on file    Outpatient Medications Prior to Visit  Medication Sig Dispense Refill  . amLODipine (NORVASC) 10 MG tablet Take 1 tablet (10 mg total) by mouth daily. 30 tablet 3  . losartan (COZAAR) 25 MG tablet Take 0.5 tablets (12.5 mg total) by mouth daily. 30 tablet  0   No facility-administered medications prior to visit.     No Known Allergies  ROS Review of Systems  Constitutional: Negative.   HENT: Negative.   Respiratory: Negative.   Cardiovascular: Negative.   Gastrointestinal: Negative.   Endocrine: Negative.   Genitourinary: Negative.   Musculoskeletal: Positive for back pain.  Neurological: Negative.   Psychiatric/Behavioral: Negative.       Objective:    Physical Exam  Constitutional: He is oriented to person, place, and time. He appears well-developed and well-nourished.  HENT:  Head: Normocephalic and atraumatic.  Eyes: Pupils are equal, round, and reactive to light. EOM are normal.  Neck: Normal range of motion.  Cardiovascular: Normal rate and regular rhythm.  Pulmonary/Chest: Effort normal and breath sounds normal.  Abdominal: Soft. Bowel  sounds are normal.  Musculoskeletal: Normal range of motion.  Neurological: He is alert and oriented to person, place, and time. He has normal reflexes.  Skin: Skin is warm and dry.  Psychiatric: He has a normal mood and affect. His behavior is normal. Judgment and thought content normal.    BP (!) 154/114 (BP Location: Left Arm, Patient Position: Sitting)   Pulse 86   Ht 5\' 9"  (1.753 m)   Wt 270 lb (122.5 kg)   SpO2 98%   BMI 39.87 kg/m  Wt Readings from Last 3 Encounters:  05/19/19 270 lb (122.5 kg)  12/11/18 248 lb 4.8 oz (112.6 kg)  11/11/18 248 lb (112.5 kg)   He gained 22 pounds and was encouraged to continue on weight loss regimen, decrease caloric intake and exercise as tolerated.  Health Maintenance Due  Topic Date Due  . HIV Screening  03/30/1980  . TETANUS/TDAP  03/30/1984  . COLONOSCOPY  03/31/2015  . INFLUENZA VACCINE  05/16/2019    There are no preventive care reminders to display for this patient.  Lab Results  Component Value Date   TSH 1.860 07/10/2018   Lab Results  Component Value Date   WBC 8.1 07/10/2018   HGB 15.0 07/10/2018   HCT 44.0 07/10/2018   MCV 82 07/10/2018   PLT 283 07/10/2018   Lab Results  Component Value Date   NA 140 11/11/2018   K 3.4 (L) 11/11/2018   CO2 27 11/11/2018   GLUCOSE 93 11/11/2018   BUN 29 (H) 11/11/2018   CREATININE 2.20 (H) 11/11/2018   BILITOT 0.3 11/11/2018   ALKPHOS 78 11/11/2018   AST 12 11/11/2018   ALT 11 11/11/2018   PROT 7.5 11/11/2018   ALBUMIN 4.4 11/11/2018   CALCIUM 10.1 11/11/2018   ANIONGAP 6 (L) 08/04/2013   Lab Results  Component Value Date   CHOL 162 07/10/2018   Lab Results  Component Value Date   HDL 50 07/10/2018   Lab Results  Component Value Date   LDLCALC 72 07/10/2018   Lab Results  Component Value Date   TRIG 202 (H) 07/10/2018   Lab Results  Component Value Date   CHOLHDL 3.2 07/10/2018   Lab Results  Component Value Date   HGBA1C 5.6 07/10/2018       Assessment & Plan:    1. Essential hypertension - Uncontrolled blood pressure due to non compliance. His Losartan was increased to 25 mg daily. He was advised to check blood pressure daily, record and bring log to office visit. He was encouraged to continue on DASH diet, exercise as tolerated. - losartan (COZAAR) 25 MG tablet; Take 1 tablet (25 mg total) by mouth daily.  Dispense: 30 tablet;  Refill: 1  2. Sleep apnea, unspecified type - He will be referred for - PSG Sleep Study; Future  3. Chronic low back pain, unspecified back pain laterality, unspecified whether sciatica present - He will continue on current treatment and follow up with PT. He was encouraged to continue on weight loss regimen.    Follow-up: Return in about 1 month (around 06/19/2019), or if symptoms worsen or fail to improve.    Iyahna Obriant Jerold Coombe, NP

## 2019-06-03 ENCOUNTER — Other Ambulatory Visit: Payer: Self-pay

## 2019-06-03 DIAGNOSIS — I10 Essential (primary) hypertension: Secondary | ICD-10-CM

## 2019-06-03 DIAGNOSIS — Z0000 Encounter for general adult medical examination without abnormal findings: Secondary | ICD-10-CM

## 2019-06-03 DIAGNOSIS — N183 Chronic kidney disease, stage 3 unspecified: Secondary | ICD-10-CM

## 2019-06-04 LAB — CBC WITH DIFFERENTIAL/PLATELET
Basophils Absolute: 0 10*3/uL (ref 0.0–0.2)
Basos: 0 %
EOS (ABSOLUTE): 0.2 10*3/uL (ref 0.0–0.4)
Eos: 2 %
Hematocrit: 46.5 % (ref 37.5–51.0)
Hemoglobin: 15.7 g/dL (ref 13.0–17.7)
Immature Grans (Abs): 0 10*3/uL (ref 0.0–0.1)
Immature Granulocytes: 0 %
Lymphocytes Absolute: 2 10*3/uL (ref 0.7–3.1)
Lymphs: 24 %
MCH: 27.7 pg (ref 26.6–33.0)
MCHC: 33.8 g/dL (ref 31.5–35.7)
MCV: 82 fL (ref 79–97)
Monocytes Absolute: 0.6 10*3/uL (ref 0.1–0.9)
Monocytes: 7 %
Neutrophils Absolute: 5.6 10*3/uL (ref 1.4–7.0)
Neutrophils: 67 %
Platelets: 319 10*3/uL (ref 150–450)
RBC: 5.66 x10E6/uL (ref 4.14–5.80)
RDW: 15.1 % (ref 11.6–15.4)
WBC: 8.3 10*3/uL (ref 3.4–10.8)

## 2019-06-04 LAB — COMPREHENSIVE METABOLIC PANEL
ALT: 27 IU/L (ref 0–44)
AST: 23 IU/L (ref 0–40)
Albumin/Globulin Ratio: 1.4 (ref 1.2–2.2)
Albumin: 4.6 g/dL (ref 3.8–4.9)
Alkaline Phosphatase: 75 IU/L (ref 39–117)
BUN/Creatinine Ratio: 13 (ref 9–20)
BUN: 23 mg/dL (ref 6–24)
Bilirubin Total: 0.4 mg/dL (ref 0.0–1.2)
CO2: 22 mmol/L (ref 20–29)
Calcium: 9.5 mg/dL (ref 8.7–10.2)
Chloride: 101 mmol/L (ref 96–106)
Creatinine, Ser: 1.82 mg/dL — ABNORMAL HIGH (ref 0.76–1.27)
GFR calc Af Amer: 48 mL/min/{1.73_m2} — ABNORMAL LOW (ref >59)
GFR calc non Af Amer: 41 mL/min/{1.73_m2} — ABNORMAL LOW (ref >59)
Globulin, Total: 3.4 g/dL (ref 1.5–4.5)
Glucose: 110 mg/dL — ABNORMAL HIGH (ref 65–99)
Potassium: 4.1 mmol/L (ref 3.5–5.2)
Sodium: 139 mmol/L (ref 134–144)
Total Protein: 8 g/dL (ref 6.0–8.5)

## 2019-06-04 LAB — LIPID PANEL
Chol/HDL Ratio: 3.7 ratio (ref 0.0–5.0)
Cholesterol, Total: 162 mg/dL (ref 100–199)
HDL: 44 mg/dL (ref >39)
LDL Calculated: 97 mg/dL (ref 0–99)
Triglycerides: 104 mg/dL (ref 0–149)
VLDL Cholesterol Cal: 21 mg/dL (ref 5–40)

## 2019-06-04 LAB — HEMOGLOBIN A1C
Est. average glucose Bld gHb Est-mCnc: 120 mg/dL
Hgb A1c MFr Bld: 5.8 % — ABNORMAL HIGH (ref 4.8–5.6)

## 2019-06-04 LAB — MICROALBUMIN / CREATININE URINE RATIO
Creatinine, Urine: 279.6 mg/dL
Microalb/Creat Ratio: 59 mg/g creat — ABNORMAL HIGH (ref 0–29)
Microalbumin, Urine: 165.9 ug/mL

## 2019-06-04 LAB — URINALYSIS
Bilirubin, UA: NEGATIVE
Glucose, UA: NEGATIVE
Ketones, UA: NEGATIVE
Leukocytes,UA: NEGATIVE
Nitrite, UA: NEGATIVE
RBC, UA: NEGATIVE
Specific Gravity, UA: 1.02 (ref 1.005–1.030)
Urobilinogen, Ur: 0.2 mg/dL (ref 0.2–1.0)
pH, UA: 5 (ref 5.0–7.5)

## 2019-06-23 ENCOUNTER — Other Ambulatory Visit: Payer: Self-pay

## 2019-06-23 ENCOUNTER — Ambulatory Visit: Payer: Self-pay | Admitting: Gerontology

## 2019-06-23 ENCOUNTER — Encounter: Payer: Self-pay | Admitting: Gerontology

## 2019-06-23 VITALS — BP 141/101 | HR 83 | Temp 98.0°F | Ht 69.0 in | Wt 274.0 lb

## 2019-06-23 DIAGNOSIS — I10 Essential (primary) hypertension: Secondary | ICD-10-CM

## 2019-06-23 DIAGNOSIS — R7303 Prediabetes: Secondary | ICD-10-CM

## 2019-06-23 DIAGNOSIS — G4733 Obstructive sleep apnea (adult) (pediatric): Secondary | ICD-10-CM

## 2019-06-23 NOTE — Progress Notes (Signed)
Established Patient Office Visit  Subjective:  Patient ID: Anthony Lam, male    DOB: January 25, 1966  Age: 53 y.o. MRN: [id number]  CC:  Chief Complaint  Patient presents with  . Hypertension    HPI Anthony Lam presents for follow up of hypertension and sleep apnea. He reports that he's compliant with his medications, continues on low sodium diet and exercises as tolerated. He continues to take 25 mg Losartan and 10 mg Amlodipine. He denies chest pain, palpitation, light headedness and peripheral edema. He states that he checks his blood pressure at home and his SBP ranges in the low to mid 140's and DBP ranges in the low 100's.  He was evaluated for sleep apnea at [facility] sleep center on 06/15/2019 by Dr Idalia Needle and it showed severe obstructive sleep apnea, AHI = 99, associated with severe oxygen desaturations to a low of 50%, arousals, and disruption of sleep architecture. 172 minutes spent with an oxygen saturation less than 88 %. Severe periodic limb movements of sleep, PLM index 75, which may be related to apneic events. He recommended PAP titration. His HgbA1c done 2 weeks ago was 5.8%, he defers treatment and reports that he has made some life style modifications. His serum creatinine done 2 weeks ago had some improvement from 2.20 to 1.82 mg/dl, GFR from 38 to 48. He denies fever, chills and no further concerns.  Past Medical History:  Diagnosis Date  . Hypertension   . Migraines   . Seizures ([place])   . TB (tuberculosis), treated 2009    History reviewed. No pertinent surgical history.  Family History  Problem Relation Age of Onset  . Hypertension Mother   . Hypertension Father     Social History   Socioeconomic History  . Marital status: Single    Spouse name: Not on file  . Number of children: 2  . Years of education: Not on file  . Highest education level: 12th grade  Occupational History  . Not on file  Social Needs  . Financial resource strain: Very hard  .  Food insecurity    Worry: Sometimes true    Inability: Never true  . Transportation needs    Medical: No    Non-medical: No  Tobacco Use  . Smoking status: Light Tobacco Smoker    Packs/day: 0.25    Years: 15.00    Pack years: 3.75  . Smokeless tobacco: Never Used  Substance and Sexual Activity  . Alcohol use: Not Currently    Comment: Not anymore  . Drug use: Never  . Sexual activity: Yes  Lifestyle  . Physical activity    Days per week: Not on file    Minutes per session: Not on file  . Stress: Not on file  Relationships  . Social Herbalist on phone: Not on file    Gets together: Not on file    Attends religious service: Not on file    Active member of club or organization: Not on file    Attends meetings of clubs or organizations: Not on file    Relationship status: Not on file  . Intimate partner violence    Fear of current or ex partner: Not on file    Emotionally abused: Not on file    Physically abused: Not on file    Forced sexual activity: Not on file  Other Topics Concern  . Not on file  Social History Narrative  . Not on file  Outpatient Medications Prior to Visit  Medication Sig Dispense Refill  . amLODipine (NORVASC) 10 MG tablet Take 1 tablet (10 mg total) by mouth daily. 30 tablet 3  . losartan (COZAAR) 25 MG tablet Take 1 tablet (25 mg total) by mouth daily. 30 tablet 1   No facility-administered medications prior to visit.     No Known Allergies  ROS Review of Systems  Constitutional: Negative.   HENT: Negative.   Eyes: Negative.   Respiratory: Negative.   Cardiovascular: Negative.   Endocrine: Negative.   Musculoskeletal: Negative for back pain.  Skin: Negative.   Neurological: Negative.   Psychiatric/Behavioral: Negative.       Objective:    Physical Exam  Constitutional: He is oriented to person, place, and time. He appears well-developed and well-nourished.  obese  HENT:  Head: Normocephalic and atraumatic.   Eyes: Pupils are equal, round, and reactive to light. EOM are normal.  Neck: Normal range of motion.  Cardiovascular: Normal rate and regular rhythm.  Pulmonary/Chest: Effort normal and breath sounds normal.  Abdominal: Soft.  Central obesity  Neurological: He is alert and oriented to person, place, and time.  Skin: Skin is warm and dry.  Psychiatric: He has a normal mood and affect. His behavior is normal. Judgment and thought content normal.    BP (!) 141/101 (BP Location: Left Arm, Patient Position: Sitting, Cuff Size: Large)   Pulse 83   Temp 98 F (36.7 C)   Ht 5\' 9"  (1.753 m)   Wt 274 lb (124.3 kg)   SpO2 95%   BMI 40.46 kg/m  Wt Readings from Last 3 Encounters:  06/23/19 274 lb (124.3 kg)  05/19/19 270 lb (122.5 kg)  12/11/18 248 lb 4.8 oz (112.6 kg)   He gained 4 pounds since last office visit and he was advised to continue on his weight loss regimen.  Health Maintenance Due  Topic Date Due  . HIV Screening  03/30/1980  . TETANUS/TDAP  03/30/1984  . COLONOSCOPY  03/31/2015  . INFLUENZA VACCINE  05/16/2019    There are no preventive care reminders to display for this patient.  Lab Results  Component Value Date   TSH 1.860 07/10/2018   Lab Results  Component Value Date   WBC 8.3 06/03/2019   HGB 15.7 06/03/2019   HCT 46.5 06/03/2019   MCV 82 06/03/2019   PLT 319 06/03/2019   Lab Results  Component Value Date   NA 139 06/03/2019   K 4.1 06/03/2019   CO2 22 06/03/2019   GLUCOSE 110 (H) 06/03/2019   BUN 23 06/03/2019   CREATININE 1.82 (H) 06/03/2019   BILITOT 0.4 06/03/2019   ALKPHOS 75 06/03/2019   AST 23 06/03/2019   ALT 27 06/03/2019   PROT 8.0 06/03/2019   ALBUMIN 4.6 06/03/2019   CALCIUM 9.5 06/03/2019   ANIONGAP 6 (L) 08/04/2013   Lab Results  Component Value Date   CHOL 162 06/03/2019   Lab Results  Component Value Date   HDL 44 06/03/2019   Lab Results  Component Value Date   LDLCALC 97 06/03/2019   Lab Results  Component  Value Date   TRIG 104 06/03/2019   Lab Results  Component Value Date   CHOLHDL 3.7 06/03/2019   Lab Results  Component Value Date   HGBA1C 5.8 (H) 06/03/2019      Assessment & Plan:     1. Essential hypertension - Uncontrolled hypertension due to non compliance with dash diet. His blood pressure at  141/101 is improving and his goal is < 140/90. He will continue on current treatment regimen until PAP titration. -Low salt DASH diet -Take medications regularly on time -Exercise regularly as tolerated -Check blood pressure daily at home record and  Bring log to clinic. -Goal is less than 140/90 and normal blood pressure is 120/80    2. Obstructive sleep apnea syndrome - He was referred to [facility] for PAP titration.  3. Prediabetes -His HgbA1c was 5.8%, he was advised to continue on low carb diet/non concentrated sweet diet and exercise as tolerated. Will recheck HgbA1c in 3 months.   Follow-up: Return in about 4 weeks (around 07/21/2019), or if symptoms worsen or fail to improve.    Esra Frankowski Jerold Coombe, NP

## 2019-06-23 NOTE — Patient Instructions (Signed)
Calorie Counting for Weight Loss Calories are units of energy. Your body needs a certain amount of calories from food to keep you going throughout the day. When you eat more calories than your body needs, your body stores the extra calories as fat. When you eat fewer calories than your body needs, your body burns fat to get the energy it needs. Calorie counting means keeping track of how many calories you eat and drink each day. Calorie counting can be helpful if you need to lose weight. If you make sure to eat fewer calories than your body needs, you should lose weight. Ask your health care provider what a healthy weight is for you. For calorie counting to work, you will need to eat the right number of calories in a day in order to lose a healthy amount of weight per week. A dietitian can help you determine how many calories you need in a day and will give you suggestions on how to reach your calorie goal.  A healthy amount of weight to lose per week is usually 1-2 lb (0.5-0.9 kg). This usually means that your daily calorie intake should be reduced by 500-750 calories.  Eating 1,200 - 1,500 calories per day can help most women lose weight.  Eating 1,500 - 1,800 calories per day can help most men lose weight. What is my plan? My goal is to have __________ calories per day. If I have this many calories per day, I should lose around __________ pounds per week. What do I need to know about calorie counting? In order to meet your daily calorie goal, you will need to:  Find out how many calories are in each food you would like to eat. Try to do this before you eat.  Decide how much of the food you plan to eat.  Write down what you ate and how many calories it had. Doing this is called keeping a food log. To successfully lose weight, it is important to balance calorie counting with a healthy lifestyle that includes regular activity. Aim for 150 minutes of moderate exercise (such as walking) or 75  minutes of vigorous exercise (such as running) each week. Where do I find calorie information?  The number of calories in a food can be found on a Nutrition Facts label. If a food does not have a Nutrition Facts label, try to look up the calories online or ask your dietitian for help. Remember that calories are listed per serving. If you choose to have more than one serving of a food, you will have to multiply the calories per serving by the amount of servings you plan to eat. For example, the label on a package of bread might say that a serving size is 1 slice and that there are 90 calories in a serving. If you eat 1 slice, you will have eaten 90 calories. If you eat 2 slices, you will have eaten 180 calories. How do I keep a food log? Immediately after each meal, record the following information in your food log:  What you ate. Don't forget to include toppings, sauces, and other extras on the food.  How much you ate. This can be measured in cups, ounces, or number of items.  How many calories each food and drink had.  The total number of calories in the meal. Keep your food log near you, such as in a small notebook in your pocket, or use a mobile app or website. Some programs will calculate  calories for you and show you how many calories you have left for the day to meet your goal. What are some calorie counting tips?   Use your calories on foods and drinks that will fill you up and not leave you hungry: ? Some examples of foods that fill you up are nuts and nut butters, vegetables, lean proteins, and high-fiber foods like whole grains. High-fiber foods are foods with more than 5 g fiber per serving. ? Drinks such as sodas, specialty coffee drinks, alcohol, and juices have a lot of calories, yet do not fill you up.  Eat nutritious foods and avoid empty calories. Empty calories are calories you get from foods or beverages that do not have many vitamins or protein, such as candy, sweets, and  soda. It is better to have a nutritious high-calorie food (such as an avocado) than a food with few nutrients (such as a bag of chips).  Know how many calories are in the foods you eat most often. This will help you calculate calorie counts faster.  Pay attention to calories in drinks. Low-calorie drinks include water and unsweetened drinks.  Pay attention to nutrition labels for "low fat" or "fat free" foods. These foods sometimes have the same amount of calories or more calories than the full fat versions. They also often have added sugar, starch, or salt, to make up for flavor that was removed with the fat.  Find a way of tracking calories that works for you. Get creative. Try different apps or programs if writing down calories does not work for you. What are some portion control tips?  Know how many calories are in a serving. This will help you know how many servings of a certain food you can have.  Use a measuring cup to measure serving sizes. You could also try weighing out portions on a kitchen scale. With time, you will be able to estimate serving sizes for some foods.  Take some time to put servings of different foods on your favorite plates, bowls, and cups so you know what a serving looks like.  Try not to eat straight from a bag or box. Doing this can lead to overeating. Put the amount you would like to eat in a cup or on a plate to make sure you are eating the right portion.  Use smaller plates, glasses, and bowls to prevent overeating.  Try not to multitask (for example, watch TV or use your computer) while eating. If it is time to eat, sit down at a table and enjoy your food. This will help you to know when you are full. It will also help you to be aware of what you are eating and how much you are eating. What are tips for following this plan? Reading food labels  Check the calorie count compared to the serving size. The serving size may be smaller than what you are used to  eating.  Check the source of the calories. Make sure the food you are eating is high in vitamins and protein and low in saturated and trans fats. Shopping  Read nutrition labels while you shop. This will help you make healthy decisions before you decide to purchase your food.  Make a grocery list and stick to it. Cooking  Try to cook your favorite foods in a healthier way. For example, try baking instead of frying.  Use low-fat dairy products. Meal planning  Use more fruits and vegetables. Half of your plate should be fruits  and vegetables.  Include lean proteins like poultry and fish. How do I count calories when eating out?  Ask for smaller portion sizes.  Consider sharing an entree and sides instead of getting your own entree.  If you get your own entree, eat only half. Ask for a box at the beginning of your meal and put the rest of your entree in it so you are not tempted to eat it.  If calories are listed on the menu, choose the lower calorie options.  Choose dishes that include vegetables, fruits, whole grains, low-fat dairy products, and lean protein.  Choose items that are boiled, broiled, grilled, or steamed. Stay away from items that are buttered, battered, fried, or served with cream sauce. Items labeled "crispy" are usually fried, unless stated otherwise.  Choose water, low-fat milk, unsweetened iced tea, or other drinks without added sugar. If you want an alcoholic beverage, choose a lower calorie option such as a glass of wine or light beer.  Ask for dressings, sauces, and syrups on the side. These are usually high in calories, so you should limit the amount you eat.  If you want a salad, choose a garden salad and ask for grilled meats. Avoid extra toppings like bacon, cheese, or fried items. Ask for the dressing on the side, or ask for olive oil and vinegar or lemon to use as dressing.  Estimate how many servings of a food you are given. For example, a serving of  cooked rice is  cup or about the size of half a baseball. Knowing serving sizes will help you be aware of how much food you are eating at restaurants. The list below tells you how big or small some common portion sizes are based on everyday objects: ? 1 oz--4 stacked dice. ? 3 oz--1 deck of cards. ? 1 tsp--1 die. ? 1 Tbsp-- a ping-pong ball. ? 2 Tbsp--1 ping-pong ball. ?  cup-- baseball. ? 1 cup--1 baseball. Summary  Calorie counting means keeping track of how many calories you eat and drink each day. If you eat fewer calories than your body needs, you should lose weight.  A healthy amount of weight to lose per week is usually 1-2 lb (0.5-0.9 kg). This usually means reducing your daily calorie intake by 500-750 calories.  The number of calories in a food can be found on a Nutrition Facts label. If a food does not have a Nutrition Facts label, try to look up the calories online or ask your dietitian for help.  Use your calories on foods and drinks that will fill you up, and not on foods and drinks that will leave you hungry.  Use smaller plates, glasses, and bowls to prevent overeating. This information is not intended to replace advice given to you by your health care provider. Make sure you discuss any questions you have with your health care provider. Document Released: 10/01/2005 Document Revised: 06/20/2018 Document Reviewed: 08/31/2016 Elsevier Patient Education  2020 [place] DASH stands for "Dietary Approaches to Stop Hypertension." The DASH eating plan is a healthy eating plan that has been shown to reduce high blood pressure (hypertension). It may also reduce your risk for type 2 diabetes, heart disease, and stroke. The DASH eating plan may also help with weight loss. What are tips for following this plan?  General guidelines  Avoid eating more than 2,300 mg (milligrams) of salt (sodium) a day. If you have hypertension, you may need to reduce your  sodium  intake to 1,500 mg a day.  Limit alcohol intake to no more than 1 drink a day for nonpregnant women and 2 drinks a day for men. One drink equals 12 oz of beer, 5 oz of wine, or 1 oz of hard liquor.  Work with your health care provider to maintain a healthy body weight or to lose weight. Ask what an ideal weight is for you.  Get at least 30 minutes of exercise that causes your heart to beat faster (aerobic exercise) most days of the week. Activities may include walking, swimming, or biking.  Work with your health care provider or diet and nutrition specialist (dietitian) to adjust your eating plan to your individual calorie needs. Reading food labels   Check food labels for the amount of sodium per serving. Choose foods with less than 5 percent of the Daily Value of sodium. Generally, foods with less than 300 mg of sodium per serving fit into this eating plan.  To find whole grains, look for the word "whole" as the first word in the ingredient list. Shopping  Buy products labeled as "low-sodium" or "no salt added."  Buy fresh foods. Avoid canned foods and premade or frozen meals. Cooking  Avoid adding salt when cooking. Use salt-free seasonings or herbs instead of table salt or sea salt. Check with your health care provider or pharmacist before using salt substitutes.  Do not fry foods. Cook foods using healthy methods such as baking, boiling, grilling, and broiling instead.  Cook with heart-healthy oils, such as olive, canola, soybean, or sunflower oil. Meal planning  Eat a balanced diet that includes: ? 5 or more servings of fruits and vegetables each day. At each meal, try to fill half of your plate with fruits and vegetables. ? Up to 6-8 servings of whole grains each day. ? Less than 6 oz of lean meat, poultry, or fish each day. A 3-oz serving of meat is about the same size as a deck of cards. One egg equals 1 oz. ? 2 servings of low-fat dairy each day. ? A serving of  nuts, seeds, or beans 5 times each week. ? Heart-healthy fats. Healthy fats called Omega-3 fatty acids are found in foods such as flaxseeds and coldwater fish, like sardines, salmon, and mackerel.  Limit how much you eat of the following: ? Canned or prepackaged foods. ? Food that is high in trans fat, such as fried foods. ? Food that is high in saturated fat, such as fatty meat. ? Sweets, desserts, sugary drinks, and other foods with added sugar. ? Full-fat dairy products.  Do not salt foods before eating.  Try to eat at least 2 vegetarian meals each week.  Eat more home-cooked food and less restaurant, buffet, and fast food.  When eating at a restaurant, ask that your food be prepared with less salt or no salt, if possible. What foods are recommended? The items listed may not be a complete list. Talk with your dietitian about what dietary choices are best for you. Grains Whole-grain or whole-wheat bread. Whole-grain or whole-wheat pasta. Brown rice. [place]. Bulgur. Whole-grain and low-sodium cereals. Pita bread. Low-fat, low-sodium crackers. Whole-wheat flour tortillas. Vegetables Fresh or frozen vegetables (raw, steamed, roasted, or grilled). Low-sodium or reduced-sodium tomato and vegetable juice. Low-sodium or reduced-sodium tomato sauce and tomato paste. Low-sodium or reduced-sodium canned vegetables. Fruits All fresh, dried, or frozen fruit. Canned fruit in natural juice (without added sugar). Meat and other protein foods Skinless chicken or [place]. Ground  chicken or [place]. Pork with fat trimmed off. Fish and seafood. Egg whites. Dried beans, peas, or lentils. Unsalted nuts, nut butters, and seeds. Unsalted canned beans. Lean cuts of beef with fat trimmed off. Low-sodium, lean deli meat. Dairy Low-fat (1%) or fat-free (skim) milk. Fat-free, low-fat, or reduced-fat cheeses. Nonfat, low-sodium ricotta or cottage cheese. Low-fat or nonfat yogurt. Low-fat, low-sodium  cheese. Fats and oils Soft margarine without trans fats. Vegetable oil. Low-fat, reduced-fat, or light mayonnaise and salad dressings (reduced-sodium). Canola, safflower, olive, soybean, and sunflower oils. Avocado. Seasoning and other foods Herbs. Spices. Seasoning mixes without salt. Unsalted popcorn and pretzels. Fat-free sweets. What foods are not recommended? The items listed may not be a complete list. Talk with your dietitian about what dietary choices are best for you. Grains Baked goods made with fat, such as croissants, muffins, or some breads. Dry pasta or rice meal packs. Vegetables Creamed or fried vegetables. Vegetables in a cheese sauce. Regular canned vegetables (not low-sodium or reduced-sodium). Regular canned tomato sauce and paste (not low-sodium or reduced-sodium). Regular tomato and vegetable juice (not low-sodium or reduced-sodium). Angie Fava. Olives. Fruits Canned fruit in a light or heavy syrup. Fried fruit. Fruit in cream or butter sauce. Meat and other protein foods Fatty cuts of meat. Ribs. Fried meat. Berniece Salines. Sausage. Bologna and other processed lunch meats. Salami. Fatback. Hotdogs. Bratwurst. Salted nuts and seeds. Canned beans with added salt. Canned or smoked fish. Whole eggs or egg yolks. Chicken or [place] with skin. Dairy Whole or 2% milk, cream, and half-and-half. Whole or full-fat cream cheese. Whole-fat or sweetened yogurt. Full-fat cheese. Nondairy creamers. Whipped toppings. Processed cheese and cheese spreads. Fats and oils Butter. Stick margarine. Lard. Shortening. Ghee. Bacon fat. Tropical oils, such as coconut, palm kernel, or palm oil. Seasoning and other foods Salted popcorn and pretzels. Onion salt, garlic salt, seasoned salt, table salt, and sea salt. Worcestershire sauce. Tartar sauce. Barbecue sauce. Teriyaki sauce. Soy sauce, including reduced-sodium. Steak sauce. Canned and packaged gravies. Fish sauce. Oyster sauce. Cocktail sauce. Horseradish  that you find on the shelf. Ketchup. Mustard. Meat flavorings and tenderizers. Bouillon cubes. Hot sauce and Tabasco sauce. Premade or packaged marinades. Premade or packaged taco seasonings. Relishes. Regular salad dressings. Where to find more information:  National Heart, Lung, and [place]: https://wilson-eaton.com/  American Heart Association: www.heart.org Summary  The DASH eating plan is a healthy eating plan that has been shown to reduce high blood pressure (hypertension). It may also reduce your risk for type 2 diabetes, heart disease, and stroke.  With the DASH eating plan, you should limit salt (sodium) intake to 2,300 mg a day. If you have hypertension, you may need to reduce your sodium intake to 1,500 mg a day.  When on the DASH eating plan, aim to eat more fresh fruits and vegetables, whole grains, lean proteins, low-fat dairy, and heart-healthy fats.  Work with your health care provider or diet and nutrition specialist (dietitian) to adjust your eating plan to your individual calorie needs. This information is not intended to replace advice given to you by your health care provider. Make sure you discuss any questions you have with your health care provider. Document Released: 09/20/2011 Document Revised: 09/13/2017 Document Reviewed: 09/24/2016 Elsevier Patient Education  2020 [employer].

## 2019-07-06 ENCOUNTER — Other Ambulatory Visit: Payer: Self-pay | Admitting: Gerontology

## 2019-07-06 DIAGNOSIS — I10 Essential (primary) hypertension: Secondary | ICD-10-CM

## 2019-07-21 ENCOUNTER — Ambulatory Visit: Payer: Self-pay | Admitting: Gerontology

## 2019-08-04 ENCOUNTER — Encounter: Payer: Self-pay | Admitting: Gerontology

## 2019-08-04 ENCOUNTER — Ambulatory Visit: Payer: Self-pay | Admitting: Gerontology

## 2019-08-04 ENCOUNTER — Other Ambulatory Visit: Payer: Self-pay

## 2019-08-04 VITALS — BP 137/95 | HR 84 | Ht 69.0 in | Wt 280.0 lb

## 2019-08-04 DIAGNOSIS — G4730 Sleep apnea, unspecified: Secondary | ICD-10-CM

## 2019-08-04 DIAGNOSIS — I10 Essential (primary) hypertension: Secondary | ICD-10-CM

## 2019-08-04 MED ORDER — AMLODIPINE BESYLATE 10 MG PO TABS: 10.0000 mg | ORAL_TABLET | Freq: Every day | ORAL | 1 refills | Status: DC

## 2019-08-04 MED ORDER — LOSARTAN POTASSIUM 25 MG PO TABS: 25.0000 mg | ORAL_TABLET | Freq: Every day | ORAL | 1 refills | Status: DC

## 2019-08-04 NOTE — Patient Instructions (Signed)

## 2019-08-04 NOTE — Progress Notes (Signed)
Established Patient Office Visit  Subjective:  Patient ID: Anthony Lam, male    DOB: Jan 01, 1966  Age: 53 y.o. MRN: [id number]  CC:  Chief Complaint  Patient presents with  . Hypertension    HPI JAEGAR SAMMARCO presents for follow up of hypertension and Sleep apnea. He repots that he does not check his blood pressure daily and his SBP readings are below 140, but DBP is in the mid to upper 90's. He's compliant with his medications, exercises as tolerated and working on losing weight. He had CPAP titration study done on 07/20/2019 and he has severe Obstructive Sleep Apnea (OSA). BIPAP at level of 18/13 cm H2O and with heated humidifier for nasal dryness, mask: ResMed Air-Fit F-20 full- face size large or mask of patient's preference was recommended. Currently, he states that he has not received the BIPAP machine, and he continues to experience intermittent xerostomia and headache when he wakes up. He reports that he's doing well and offers no further concern.  Past Medical History:  Diagnosis Date  . Hypertension   . Migraines   . Seizures ([place])   . TB (tuberculosis), treated 2009    No past surgical history on file.  Family History  Problem Relation Age of Onset  . Hypertension Mother   . Hypertension Father     Social History   Socioeconomic History  . Marital status: Single    Spouse name: Not on file  . Number of children: 2  . Years of education: Not on file  . Highest education level: 12th grade  Occupational History  . Not on file  Social Needs  . Financial resource strain: Very hard  . Food insecurity    Worry: Sometimes true    Inability: Never true  . Transportation needs    Medical: No    Non-medical: No  Tobacco Use  . Smoking status: Light Tobacco Smoker    Packs/day: 0.25    Years: 15.00    Pack years: 3.75  . Smokeless tobacco: Never Used  Substance and Sexual Activity  . Alcohol use: Not Currently    Comment: Not anymore  . Drug use: Never   . Sexual activity: Yes  Lifestyle  . Physical activity    Days per week: Not on file    Minutes per session: Not on file  . Stress: Not on file  Relationships  . Social Herbalist on phone: Not on file    Gets together: Not on file    Attends religious service: Not on file    Active member of club or organization: Not on file    Attends meetings of clubs or organizations: Not on file    Relationship status: Not on file  . Intimate partner violence    Fear of current or ex partner: Not on file    Emotionally abused: Not on file    Physically abused: Not on file    Forced sexual activity: Not on file  Other Topics Concern  . Not on file  Social History Narrative  . Not on file    Outpatient Medications Prior to Visit  Medication Sig Dispense Refill  . amLODipine (NORVASC) 10 MG tablet TAKE ONE TABLET BY MOUTH EVERY DAY 90 tablet 0  . losartan (COZAAR) 25 MG tablet Take 1 tablet (25 mg total) by mouth daily. 30 tablet 1   No facility-administered medications prior to visit.     No Known Allergies  ROS Review of  Systems  Constitutional: Negative.   Respiratory: Negative.   Cardiovascular: Negative.   Genitourinary: Negative.   Musculoskeletal: Negative for back pain.  Skin: Negative.   Neurological: Negative.   Psychiatric/Behavioral: Negative.       Objective:    Physical Exam  Constitutional: He is oriented to person, place, and time. He appears well-developed.  Obese  HENT:  Head: Normocephalic and atraumatic.  Eyes: Pupils are equal, round, and reactive to light. EOM are normal.  Neck: Normal range of motion.  Cardiovascular: Normal rate and regular rhythm.  Pulmonary/Chest: Effort normal and breath sounds normal.  Neurological: He is alert and oriented to person, place, and time. He has normal reflexes.  Skin: Skin is warm and dry.  Psychiatric: He has a normal mood and affect. His behavior is normal. Judgment and thought content normal.     BP (!) 137/95 (BP Location: Left Arm, Patient Position: Sitting)   Pulse 84   Ht 5\' 9"  (1.753 m)   Wt 280 lb (127 kg)   SpO2 95%   BMI 41.35 kg/m  Wt Readings from Last 3 Encounters:  08/04/19 280 lb (127 kg)  06/23/19 274 lb (124.3 kg)  05/19/19 270 lb (122.5 kg)   He was encouraged to continue on current weight loss regimen.  Health Maintenance Due  Topic Date Due  . HIV Screening  03/30/1980  . TETANUS/TDAP  03/30/1984  . COLONOSCOPY  03/31/2015  . INFLUENZA VACCINE  05/16/2019    There are no preventive care reminders to display for this patient.  Lab Results  Component Value Date   TSH 1.860 07/10/2018   Lab Results  Component Value Date   WBC 8.3 06/03/2019   HGB 15.7 06/03/2019   HCT 46.5 06/03/2019   MCV 82 06/03/2019   PLT 319 06/03/2019   Lab Results  Component Value Date   NA 139 06/03/2019   K 4.1 06/03/2019   CO2 22 06/03/2019   GLUCOSE 110 (H) 06/03/2019   BUN 23 06/03/2019   CREATININE 1.82 (H) 06/03/2019   BILITOT 0.4 06/03/2019   ALKPHOS 75 06/03/2019   AST 23 06/03/2019   ALT 27 06/03/2019   PROT 8.0 06/03/2019   ALBUMIN 4.6 06/03/2019   CALCIUM 9.5 06/03/2019   ANIONGAP 6 (L) 08/04/2013   Lab Results  Component Value Date   CHOL 162 06/03/2019   Lab Results  Component Value Date   HDL 44 06/03/2019   Lab Results  Component Value Date   LDLCALC 97 06/03/2019   Lab Results  Component Value Date   TRIG 104 06/03/2019   Lab Results  Component Value Date   CHOLHDL 3.7 06/03/2019   Lab Results  Component Value Date   HGBA1C 5.8 (H) 06/03/2019      Assessment & Plan:     1. Essential hypertension -His blood pressure of 137/95 is improving, but his DBP is not within normal limits. He will continue on current treatment regimen and was advised to  -Low salt DASH diet -Take medications regularly on time -Exercise regularly as tolerated -Check blood pressure at least once a week at home or a nearby pharmacy and  record -Goal is less than 140/90 and normal blood pressure is 120/80 - losartan (COZAAR) 25 MG tablet; Take 1 tablet (25 mg total) by mouth daily.  Dispense: 30 tablet; Refill: 1  2. Sleep apnea, unspecified type - He has severe OSA, and was advised to complete and submit [facility] care application. He was encouraged to  call and follow up with [facility] on how to obtain a BIPAP machine.    Follow-up: Return in about 1 month (around 09/04/2019), or if symptoms worsen or fail to improve.    Areanna Gengler Jerold Coombe, NP

## 2019-08-10 ENCOUNTER — Other Ambulatory Visit: Payer: Self-pay

## 2019-08-10 DIAGNOSIS — G4730 Sleep apnea, unspecified: Secondary | ICD-10-CM

## 2019-09-08 ENCOUNTER — Ambulatory Visit: Payer: Self-pay | Admitting: Gerontology

## 2019-09-16 ENCOUNTER — Ambulatory Visit: Payer: Self-pay | Admitting: Gerontology

## 2019-09-16 ENCOUNTER — Other Ambulatory Visit: Payer: Self-pay

## 2019-09-16 ENCOUNTER — Encounter: Payer: Self-pay | Admitting: Gerontology

## 2019-09-16 VITALS — BP 151/97 | HR 101 | Ht 69.0 in | Wt 278.0 lb

## 2019-09-16 DIAGNOSIS — I10 Essential (primary) hypertension: Secondary | ICD-10-CM

## 2019-09-16 DIAGNOSIS — R7303 Prediabetes: Secondary | ICD-10-CM

## 2019-09-16 DIAGNOSIS — Z0000 Encounter for general adult medical examination without abnormal findings: Secondary | ICD-10-CM

## 2019-09-16 MED ORDER — AMLODIPINE BESYLATE 10 MG PO TABS: 10.0000 mg | ORAL_TABLET | Freq: Every day | ORAL | 1 refills | Status: DC

## 2019-09-16 MED ORDER — LOSARTAN POTASSIUM 50 MG PO TABS: 50.0000 mg | ORAL_TABLET | Freq: Every day | ORAL | 1 refills | Status: DC

## 2019-09-16 NOTE — Patient Instructions (Signed)
Calorie Counting for Weight Loss Calories are units of energy. Your body needs a certain amount of calories from food to keep you going throughout the day. When you eat more calories than your body needs, your body stores the extra calories as fat. When you eat fewer calories than your body needs, your body burns fat to get the energy it needs. Calorie counting means keeping track of how many calories you eat and drink each day. Calorie counting can be helpful if you need to lose weight. If you make sure to eat fewer calories than your body needs, you should lose weight. Ask your health care provider what a healthy weight is for you. For calorie counting to work, you will need to eat the right number of calories in a day in order to lose a healthy amount of weight per week. A dietitian can help you determine how many calories you need in a day and will give you suggestions on how to reach your calorie goal.  A healthy amount of weight to lose per week is usually 1-2 lb (0.5-0.9 kg). This usually means that your daily calorie intake should be reduced by 500-750 calories.  Eating 1,200 - 1,500 calories per day can help most women lose weight.  Eating 1,500 - 1,800 calories per day can help most men lose weight. What is my plan? My goal is to have __________ calories per day. If I have this many calories per day, I should lose around __________ pounds per week. What do I need to know about calorie counting? In order to meet your daily calorie goal, you will need to:  Find out how many calories are in each food you would like to eat. Try to do this before you eat.  Decide how much of the food you plan to eat.  Write down what you ate and how many calories it had. Doing this is called keeping a food log. To successfully lose weight, it is important to balance calorie counting with a healthy lifestyle that includes regular activity. Aim for 150 minutes of moderate exercise (such as walking) or 75  minutes of vigorous exercise (such as running) each week. Where do I find calorie information?  The number of calories in a food can be found on a Nutrition Facts label. If a food does not have a Nutrition Facts label, try to look up the calories online or ask your dietitian for help. Remember that calories are listed per serving. If you choose to have more than one serving of a food, you will have to multiply the calories per serving by the amount of servings you plan to eat. For example, the label on a package of bread might say that a serving size is 1 slice and that there are 90 calories in a serving. If you eat 1 slice, you will have eaten 90 calories. If you eat 2 slices, you will have eaten 180 calories. How do I keep a food log? Immediately after each meal, record the following information in your food log:  What you ate. Don't forget to include toppings, sauces, and other extras on the food.  How much you ate. This can be measured in cups, ounces, or number of items.  How many calories each food and drink had.  The total number of calories in the meal. Keep your food log near you, such as in a small notebook in your pocket, or use a mobile app or website. Some programs will calculate  calories for you and show you how many calories you have left for the day to meet your goal. What are some calorie counting tips?   Use your calories on foods and drinks that will fill you up and not leave you hungry: ? Some examples of foods that fill you up are nuts and nut butters, vegetables, lean proteins, and high-fiber foods like whole grains. High-fiber foods are foods with more than 5 g fiber per serving. ? Drinks such as sodas, specialty coffee drinks, alcohol, and juices have a lot of calories, yet do not fill you up.  Eat nutritious foods and avoid empty calories. Empty calories are calories you get from foods or beverages that do not have many vitamins or protein, such as candy, sweets, and  soda. It is better to have a nutritious high-calorie food (such as an avocado) than a food with few nutrients (such as a bag of chips).  Know how many calories are in the foods you eat most often. This will help you calculate calorie counts faster.  Pay attention to calories in drinks. Low-calorie drinks include water and unsweetened drinks.  Pay attention to nutrition labels for "low fat" or "fat free" foods. These foods sometimes have the same amount of calories or more calories than the full fat versions. They also often have added sugar, starch, or salt, to make up for flavor that was removed with the fat.  Find a way of tracking calories that works for you. Get creative. Try different apps or programs if writing down calories does not work for you. What are some portion control tips?  Know how many calories are in a serving. This will help you know how many servings of a certain food you can have.  Use a measuring cup to measure serving sizes. You could also try weighing out portions on a kitchen scale. With time, you will be able to estimate serving sizes for some foods.  Take some time to put servings of different foods on your favorite plates, bowls, and cups so you know what a serving looks like.  Try not to eat straight from a bag or box. Doing this can lead to overeating. Put the amount you would like to eat in a cup or on a plate to make sure you are eating the right portion.  Use smaller plates, glasses, and bowls to prevent overeating.  Try not to multitask (for example, watch TV or use your computer) while eating. If it is time to eat, sit down at a table and enjoy your food. This will help you to know when you are full. It will also help you to be aware of what you are eating and how much you are eating. What are tips for following this plan? Reading food labels  Check the calorie count compared to the serving size. The serving size may be smaller than what you are used to  eating.  Check the source of the calories. Make sure the food you are eating is high in vitamins and protein and low in saturated and trans fats. Shopping  Read nutrition labels while you shop. This will help you make healthy decisions before you decide to purchase your food.  Make a grocery list and stick to it. Cooking  Try to cook your favorite foods in a healthier way. For example, try baking instead of frying.  Use low-fat dairy products. Meal planning  Use more fruits and vegetables. Half of your plate should be fruits  and vegetables.  Include lean proteins like poultry and fish. How do I count calories when eating out?  Ask for smaller portion sizes.  Consider sharing an entree and sides instead of getting your own entree.  If you get your own entree, eat only half. Ask for a box at the beginning of your meal and put the rest of your entree in it so you are not tempted to eat it.  If calories are listed on the menu, choose the lower calorie options.  Choose dishes that include vegetables, fruits, whole grains, low-fat dairy products, and lean protein.  Choose items that are boiled, broiled, grilled, or steamed. Stay away from items that are buttered, battered, fried, or served with cream sauce. Items labeled "crispy" are usually fried, unless stated otherwise.  Choose water, low-fat milk, unsweetened iced tea, or other drinks without added sugar. If you want an alcoholic beverage, choose a lower calorie option such as a glass of wine or light beer.  Ask for dressings, sauces, and syrups on the side. These are usually high in calories, so you should limit the amount you eat.  If you want a salad, choose a garden salad and ask for grilled meats. Avoid extra toppings like bacon, cheese, or fried items. Ask for the dressing on the side, or ask for olive oil and vinegar or lemon to use as dressing.  Estimate how many servings of a food you are given. For example, a serving of  cooked rice is  cup or about the size of half a baseball. Knowing serving sizes will help you be aware of how much food you are eating at restaurants. The list below tells you how big or small some common portion sizes are based on everyday objects: ? 1 oz--4 stacked dice. ? 3 oz--1 deck of cards. ? 1 tsp--1 die. ? 1 Tbsp-- a ping-pong ball. ? 2 Tbsp--1 ping-pong ball. ?  cup-- baseball. ? 1 cup--1 baseball. Summary  Calorie counting means keeping track of how many calories you eat and drink each day. If you eat fewer calories than your body needs, you should lose weight.  A healthy amount of weight to lose per week is usually 1-2 lb (0.5-0.9 kg). This usually means reducing your daily calorie intake by 500-750 calories.  The number of calories in a food can be found on a Nutrition Facts label. If a food does not have a Nutrition Facts label, try to look up the calories online or ask your dietitian for help.  Use your calories on foods and drinks that will fill you up, and not on foods and drinks that will leave you hungry.  Use smaller plates, glasses, and bowls to prevent overeating. This information is not intended to replace advice given to you by your health care provider. Make sure you discuss any questions you have with your health care provider. Document Released: 10/01/2005 Document Revised: 06/20/2018 Document Reviewed: 08/31/2016 Elsevier Patient Education  2020 [employer]. Exercising to Lose Weight Exercise is structured, repetitive physical activity to improve fitness and health. Getting regular exercise is important for everyone. It is especially important if you are overweight. Being overweight increases your risk of heart disease, stroke, diabetes, high blood pressure, and several types of cancer. Reducing your calorie intake and exercising can help you lose weight. Exercise is usually categorized as moderate or vigorous intensity. To lose weight, most people need to do  a certain amount of moderate-intensity or vigorous-intensity exercise each week. Moderate-intensity exercise  Moderate-intensity exercise is any activity that gets you moving enough to burn at least three times more energy (calories) than if you were sitting. Examples of moderate exercise include:  Walking a mile in 15 minutes.  Doing light yard work.  Biking at an easy pace. Most people should get at least 150 minutes (2 hours and 30 minutes) a week of moderate-intensity exercise to maintain their body weight. Vigorous-intensity exercise Vigorous-intensity exercise is any activity that gets you moving enough to burn at least six times more calories than if you were sitting. When you exercise at this intensity, you should be working hard enough that you are not able to carry on a conversation. Examples of vigorous exercise include:  Running.  Playing a team sport, such as football, basketball, and soccer.  Jumping rope. Most people should get at least 75 minutes (1 hour and 15 minutes) a week of vigorous-intensity exercise to maintain their body weight. How can exercise affect me? When you exercise enough to burn more calories than you eat, you lose weight. Exercise also reduces body fat and builds muscle. The more muscle you have, the more calories you burn. Exercise also:  Improves mood.  Reduces stress and tension.  Improves your overall fitness, flexibility, and endurance.  Increases bone strength. The amount of exercise you need to lose weight depends on:  Your age.  The type of exercise.  Any health conditions you have.  Your overall physical ability. Talk to your health care provider about how much exercise you need and what types of activities are safe for you. What actions can I take to lose weight? Nutrition   Make changes to your diet as told by your health care provider or diet and nutrition specialist (dietitian). This may include: ? Eating fewer  calories. ? Eating more protein. ? Eating less unhealthy fats. ? Eating a diet that includes fresh fruits and vegetables, whole grains, low-fat dairy products, and lean protein. ? Avoiding foods with added fat, salt, and sugar.  Drink plenty of water while you exercise to prevent dehydration or heat stroke. Activity  Choose an activity that you enjoy and set realistic goals. Your health care provider can help you make an exercise plan that works for you.  Exercise at a moderate or vigorous intensity most days of the week. ? The intensity of exercise may vary from person to person. You can tell how intense a workout is for you by paying attention to your breathing and heartbeat. Most people will notice their breathing and heartbeat get faster with more intense exercise.  Do resistance training twice each week, such as: ? Push-ups. ? Sit-ups. ? Lifting weights. ? Using resistance bands.  Getting short amounts of exercise can be just as helpful as long structured periods of exercise. If you have trouble finding time to exercise, try to include exercise in your daily routine. ? Get up, stretch, and walk around every 30 minutes throughout the day. ? Go for a walk during your lunch break. ? Park your car farther away from your destination. ? If you take public transportation, get off one stop early and walk the rest of the way. ? Make phone calls while standing up and walking around. ? Take the stairs instead of elevators or escalators.  Wear comfortable clothes and shoes with good support.  Do not exercise so much that you hurt yourself, feel dizzy, or get very short of breath. Where to find more information  [place] Department of Health and  Human Services: [employer].at  Centers for Disease Control and Prevention (CDC): http://www.wolf.info/ Contact a health care provider:  Before starting a new exercise program.  If you have questions or concerns about your weight.  If you have a medical  problem that keeps you from exercising. Get help right away if you have any of the following while exercising:  Injury.  Dizziness.  Difficulty breathing or shortness of breath that does not go away when you stop exercising.  Chest pain.  Rapid heartbeat. Summary  Being overweight increases your risk of heart disease, stroke, diabetes, high blood pressure, and several types of cancer.  Losing weight happens when you burn more calories than you eat.  Reducing the amount of calories you eat in addition to getting regular moderate or vigorous exercise each week helps you lose weight. This information is not intended to replace advice given to you by your health care provider. Make sure you discuss any questions you have with your health care provider. Document Released: 11/03/2010 Document Revised: 10/14/2017 Document Reviewed: 10/14/2017 Elsevier Patient Education  2020 [employer] DASH stands for "Dietary Approaches to Stop Hypertension." The DASH eating plan is a healthy eating plan that has been shown to reduce high blood pressure (hypertension). It may also reduce your risk for type 2 diabetes, heart disease, and stroke. The DASH eating plan may also help with weight loss. What are tips for following this plan?  General guidelines  Avoid eating more than 2,300 mg (milligrams) of salt (sodium) a day. If you have hypertension, you may need to reduce your sodium intake to 1,500 mg a day.  Limit alcohol intake to no more than 1 drink a day for nonpregnant women and 2 drinks a day for men. One drink equals 12 oz of beer, 5 oz of wine, or 1 oz of hard liquor.  Work with your health care provider to maintain a healthy body weight or to lose weight. Ask what an ideal weight is for you.  Get at least 30 minutes of exercise that causes your heart to beat faster (aerobic exercise) most days of the week. Activities may include walking, swimming, or biking.  Work with  your health care provider or diet and nutrition specialist (dietitian) to adjust your eating plan to your individual calorie needs. Reading food labels   Check food labels for the amount of sodium per serving. Choose foods with less than 5 percent of the Daily Value of sodium. Generally, foods with less than 300 mg of sodium per serving fit into this eating plan.  To find whole grains, look for the word "whole" as the first word in the ingredient list. Shopping  Buy products labeled as "low-sodium" or "no salt added."  Buy fresh foods. Avoid canned foods and premade or frozen meals. Cooking  Avoid adding salt when cooking. Use salt-free seasonings or herbs instead of table salt or sea salt. Check with your health care provider or pharmacist before using salt substitutes.  Do not fry foods. Cook foods using healthy methods such as baking, boiling, grilling, and broiling instead.  Cook with heart-healthy oils, such as olive, canola, soybean, or sunflower oil. Meal planning  Eat a balanced diet that includes: ? 5 or more servings of fruits and vegetables each day. At each meal, try to fill half of your plate with fruits and vegetables. ? Up to 6-8 servings of whole grains each day. ? Less than 6 oz of lean meat, poultry, or fish each  day. A 3-oz serving of meat is about the same size as a deck of cards. One egg equals 1 oz. ? 2 servings of low-fat dairy each day. ? A serving of nuts, seeds, or beans 5 times each week. ? Heart-healthy fats. Healthy fats called Omega-3 fatty acids are found in foods such as flaxseeds and coldwater fish, like sardines, salmon, and mackerel.  Limit how much you eat of the following: ? Canned or prepackaged foods. ? Food that is high in trans fat, such as fried foods. ? Food that is high in saturated fat, such as fatty meat. ? Sweets, desserts, sugary drinks, and other foods with added sugar. ? Full-fat dairy products.  Do not salt foods before  eating.  Try to eat at least 2 vegetarian meals each week.  Eat more home-cooked food and less restaurant, buffet, and fast food.  When eating at a restaurant, ask that your food be prepared with less salt or no salt, if possible. What foods are recommended? The items listed may not be a complete list. Talk with your dietitian about what dietary choices are best for you. Grains Whole-grain or whole-wheat bread. Whole-grain or whole-wheat pasta. Brown rice. [place]. Bulgur. Whole-grain and low-sodium cereals. Pita bread. Low-fat, low-sodium crackers. Whole-wheat flour tortillas. Vegetables Fresh or frozen vegetables (raw, steamed, roasted, or grilled). Low-sodium or reduced-sodium tomato and vegetable juice. Low-sodium or reduced-sodium tomato sauce and tomato paste. Low-sodium or reduced-sodium canned vegetables. Fruits All fresh, dried, or frozen fruit. Canned fruit in natural juice (without added sugar). Meat and other protein foods Skinless chicken or [place]. Ground chicken or [place]. Pork with fat trimmed off. Fish and seafood. Egg whites. Dried beans, peas, or lentils. Unsalted nuts, nut butters, and seeds. Unsalted canned beans. Lean cuts of beef with fat trimmed off. Low-sodium, lean deli meat. Dairy Low-fat (1%) or fat-free (skim) milk. Fat-free, low-fat, or reduced-fat cheeses. Nonfat, low-sodium ricotta or cottage cheese. Low-fat or nonfat yogurt. Low-fat, low-sodium cheese. Fats and oils Soft margarine without trans fats. Vegetable oil. Low-fat, reduced-fat, or light mayonnaise and salad dressings (reduced-sodium). Canola, safflower, olive, soybean, and sunflower oils. Avocado. Seasoning and other foods Herbs. Spices. Seasoning mixes without salt. Unsalted popcorn and pretzels. Fat-free sweets. What foods are not recommended? The items listed may not be a complete list. Talk with your dietitian about what dietary choices are best for you. Grains Baked goods made with fat,  such as croissants, muffins, or some breads. Dry pasta or rice meal packs. Vegetables Creamed or fried vegetables. Vegetables in a cheese sauce. Regular canned vegetables (not low-sodium or reduced-sodium). Regular canned tomato sauce and paste (not low-sodium or reduced-sodium). Regular tomato and vegetable juice (not low-sodium or reduced-sodium). Angie Fava. Olives. Fruits Canned fruit in a light or heavy syrup. Fried fruit. Fruit in cream or butter sauce. Meat and other protein foods Fatty cuts of meat. Ribs. Fried meat. Berniece Salines. Sausage. Bologna and other processed lunch meats. Salami. Fatback. Hotdogs. Bratwurst. Salted nuts and seeds. Canned beans with added salt. Canned or smoked fish. Whole eggs or egg yolks. Chicken or [place] with skin. Dairy Whole or 2% milk, cream, and half-and-half. Whole or full-fat cream cheese. Whole-fat or sweetened yogurt. Full-fat cheese. Nondairy creamers. Whipped toppings. Processed cheese and cheese spreads. Fats and oils Butter. Stick margarine. Lard. Shortening. Ghee. Bacon fat. Tropical oils, such as coconut, palm kernel, or palm oil. Seasoning and other foods Salted popcorn and pretzels. Onion salt, garlic salt, seasoned salt, table salt, and sea salt. Worcestershire sauce.  Tartar sauce. Barbecue sauce. Teriyaki sauce. Soy sauce, including reduced-sodium. Steak sauce. Canned and packaged gravies. Fish sauce. Oyster sauce. Cocktail sauce. Horseradish that you find on the shelf. Ketchup. Mustard. Meat flavorings and tenderizers. Bouillon cubes. Hot sauce and Tabasco sauce. Premade or packaged marinades. Premade or packaged taco seasonings. Relishes. Regular salad dressings. Where to find more information:  National Heart, Lung, and [place]: https://wilson-eaton.com/  American Heart Association: www.heart.org Summary  The DASH eating plan is a healthy eating plan that has been shown to reduce high blood pressure (hypertension). It may also reduce your risk for  type 2 diabetes, heart disease, and stroke.  With the DASH eating plan, you should limit salt (sodium) intake to 2,300 mg a day. If you have hypertension, you may need to reduce your sodium intake to 1,500 mg a day.  When on the DASH eating plan, aim to eat more fresh fruits and vegetables, whole grains, lean proteins, low-fat dairy, and heart-healthy fats.  Work with your health care provider or diet and nutrition specialist (dietitian) to adjust your eating plan to your individual calorie needs. This information is not intended to replace advice given to you by your health care provider. Make sure you discuss any questions you have with your health care provider. Document Released: 09/20/2011 Document Revised: 09/13/2017 Document Reviewed: 09/24/2016 Elsevier Patient Education  2020 [employer].

## 2019-09-16 NOTE — Progress Notes (Signed)
Established Patient Office Visit  Subjective:  Patient ID: Anthony Lam, male    DOB: [birth year]  Age: 53 y.o. MRN: [id number]  CC:  Chief Complaint  Patient presents with  . Hypertension    HPI Anthony Lam presents for follow up of hypertension and Sleep apnea.  He states that he received his BiPAP machine, and his sleep quality has improved.  He received L 3/L4 epidural steroid injection at [facility] on 09/15/2019, and he states that his back pain has 75% improvement. He was seen by the Nephrologist, Dr Ellison Hughs.A on 08/17/2019 for his CKD stage 3 and he recommended increasing Losartan to 50 mg. He states that he is compliant with his medications, checks his blood pressure at home, but forgot to bring log during his visit. He states that he is working on his diet and walking more after his Lumbar steroid injection. He states that he's doing well and offers no further complaint.  Past Medical History:  Diagnosis Date  . Hypertension   . Migraines   . Seizures ([place])   . TB (tuberculosis), treated 2009    No past surgical history on file.  Family History  Problem Relation Age of Onset  . Hypertension Mother   . Hypertension Father     Social History   Socioeconomic History  . Marital status: Single    Spouse name: Not on file  . Number of children: 2  . Years of education: Not on file  . Highest education level: 12th grade  Occupational History  . Not on file  Social Needs  . Financial resource strain: Very hard  . Food insecurity    Worry: Sometimes true    Inability: Never true  . Transportation needs    Medical: No    Non-medical: No  Tobacco Use  . Smoking status: Light Tobacco Smoker    Packs/day: 0.25    Years: 15.00    Pack years: 3.75  . Smokeless tobacco: Never Used  Substance and Sexual Activity  . Alcohol use: Not Currently    Comment: Not anymore  . Drug use: Never  . Sexual activity: Yes  Lifestyle  . Physical activity    Days per week: Not on  file    Minutes per session: Not on file  . Stress: Not on file  Relationships  . Social Herbalist on phone: Not on file    Gets together: Not on file    Attends religious service: Not on file    Active member of club or organization: Not on file    Attends meetings of clubs or organizations: Not on file    Relationship status: Not on file  . Intimate partner violence    Fear of current or ex partner: Not on file    Emotionally abused: Not on file    Physically abused: Not on file    Forced sexual activity: Not on file  Other Topics Concern  . Not on file  Social History Narrative  . Not on file    Outpatient Medications Prior to Visit  Medication Sig Dispense Refill  . amLODipine (NORVASC) 10 MG tablet Take 1 tablet (10 mg total) by mouth daily. 90 tablet 1  . losartan (COZAAR) 25 MG tablet Take 1 tablet (25 mg total) by mouth daily. 30 tablet 1   No facility-administered medications prior to visit.     No Known Allergies  ROS Review of Systems  Constitutional: Negative.  Respiratory: Negative.   Cardiovascular: Negative.   Genitourinary: Negative.   Neurological: Negative.   Psychiatric/Behavioral: Negative.       Objective:    Physical Exam  Constitutional: He is oriented to person, place, and time. He appears well-developed.  HENT:  Head: Normocephalic and atraumatic.  Eyes: Pupils are equal, round, and reactive to light. EOM are normal.  Cardiovascular: Normal rate and regular rhythm.  Pulmonary/Chest: Effort normal and breath sounds normal.  Neurological: He is alert and oriented to person, place, and time.  Skin: Skin is warm and dry.  Psychiatric: He has a normal mood and affect. His behavior is normal. Judgment and thought content normal.    BP (!) 151/97 (BP Location: Left Arm, Patient Position: Sitting)   Pulse (!) 101   Ht 5\' 9"  (1.753 m)   Wt 278 lb (126.1 kg)   SpO2 92%   BMI 41.05 kg/m  Wt Readings from Last 3 Encounters:   09/16/19 278 lb (126.1 kg)  08/04/19 280 lb (127 kg)  06/23/19 274 lb (124.3 kg)   He lost 2 pounds and was encouraged to continue on his weight loss regimen.  Health Maintenance Due  Topic Date Due  . HIV Screening  03/30/1980  . TETANUS/TDAP  03/30/1984  . COLONOSCOPY  03/31/2015  . INFLUENZA VACCINE  05/16/2019    There are no preventive care reminders to display for this patient.  Lab Results  Component Value Date   TSH 1.860 07/10/2018   Lab Results  Component Value Date   WBC 8.3 06/03/2019   HGB 15.7 06/03/2019   HCT 46.5 06/03/2019   MCV 82 06/03/2019   PLT 319 06/03/2019   Lab Results  Component Value Date   NA 139 06/03/2019   K 4.1 06/03/2019   CO2 22 06/03/2019   GLUCOSE 110 (H) 06/03/2019   BUN 23 06/03/2019   CREATININE 1.82 (H) 06/03/2019   BILITOT 0.4 06/03/2019   ALKPHOS 75 06/03/2019   AST 23 06/03/2019   ALT 27 06/03/2019   PROT 8.0 06/03/2019   ALBUMIN 4.6 06/03/2019   CALCIUM 9.5 06/03/2019   ANIONGAP 6 (L) 08/04/2013   Lab Results  Component Value Date   CHOL 162 06/03/2019   Lab Results  Component Value Date   HDL 44 06/03/2019   Lab Results  Component Value Date   LDLCALC 97 06/03/2019   Lab Results  Component Value Date   TRIG 104 06/03/2019   Lab Results  Component Value Date   CHOLHDL 3.7 06/03/2019   Lab Results  Component Value Date   HGBA1C 5.8 (H) 06/03/2019      Assessment & Plan:     1. Essential hypertension -His blood pressure is not controlled,he agreed for his Losartan to be increased to 50 mg. He was advised to continue on DASH diet, and exercise as tolerated and lose weight.  He was also advised to check his blood pressure daily, record and bring log to office visit. - losartan (COZAAR) 50 MG tablet; Take 1 tablet (50 mg total) by mouth daily. Pls provide 90 day supply  Dispense: 90 tablet; Refill: 1 - amLODipine (NORVASC) 10 MG tablet; Take 1 tablet (10 mg total) by mouth daily.  Dispense: 90  tablet; Refill: 1  2. Health care maintenance  - Flu Vaccine QUAD 6+ mos PF IM (Fluarix Quad PF) was administered.  3. Prediabetes His HgbA1c was 5.8%, will recheck lab prior to starting Metformin therapy. He was advised to continue on low  carb/non concentrated sweet diet. - HgB A1c; Future   Follow-up: Return in about 2 months (around 12/01/2019), or if symptoms worsen or fail to improve.    Alaynah Schutter Jerold Coombe, NP

## 2019-11-25 ENCOUNTER — Other Ambulatory Visit: Payer: Self-pay

## 2019-11-25 DIAGNOSIS — R7303 Prediabetes: Secondary | ICD-10-CM

## 2019-11-26 LAB — HEMOGLOBIN A1C
Est. average glucose Bld gHb Est-mCnc: 123 mg/dL
Hgb A1c MFr Bld: 5.9 % — ABNORMAL HIGH (ref 4.8–5.6)

## 2019-12-01 ENCOUNTER — Ambulatory Visit: Payer: Self-pay | Admitting: Gerontology

## 2019-12-01 ENCOUNTER — Other Ambulatory Visit: Payer: Self-pay

## 2019-12-01 VITALS — BP 144/95

## 2019-12-01 DIAGNOSIS — G8929 Other chronic pain: Secondary | ICD-10-CM

## 2019-12-01 DIAGNOSIS — Z0000 Encounter for general adult medical examination without abnormal findings: Secondary | ICD-10-CM

## 2019-12-01 DIAGNOSIS — I10 Essential (primary) hypertension: Secondary | ICD-10-CM

## 2019-12-01 DIAGNOSIS — M545 Low back pain, unspecified: Secondary | ICD-10-CM

## 2019-12-01 DIAGNOSIS — R7303 Prediabetes: Secondary | ICD-10-CM

## 2019-12-01 NOTE — Patient Instructions (Signed)
DASH Eating Plan DASH stands for "Dietary Approaches to Stop Hypertension." The DASH eating plan is a healthy eating plan that has been shown to reduce high blood pressure (hypertension). It may also reduce your risk for type 2 diabetes, heart disease, and stroke. The DASH eating plan may also help with weight loss. What are tips for following this plan?  General guidelines  Avoid eating more than 2,300 mg (milligrams) of salt (sodium) a day. If you have hypertension, you may need to reduce your sodium intake to 1,500 mg a day.  Limit alcohol intake to no more than 1 drink a day for nonpregnant women and 2 drinks a day for men. One drink equals 12 oz of beer, 5 oz of wine, or 1 oz of hard liquor.  Work with your health care provider to maintain a healthy body weight or to lose weight. Ask what an ideal weight is for you.  Get at least 30 minutes of exercise that causes your heart to beat faster (aerobic exercise) most days of the week. Activities may include walking, swimming, or biking.  Work with your health care provider or diet and nutrition specialist (dietitian) to adjust your eating plan to your individual calorie needs. Reading food labels   Check food labels for the amount of sodium per serving. Choose foods with less than 5 percent of the Daily Value of sodium. Generally, foods with less than 300 mg of sodium per serving fit into this eating plan.  To find whole grains, look for the word "whole" as the first word in the ingredient list. Shopping  Buy products labeled as "low-sodium" or "no salt added."  Buy fresh foods. Avoid canned foods and premade or frozen meals. Cooking  Avoid adding salt when cooking. Use salt-free seasonings or herbs instead of table salt or sea salt. Check with your health care provider or pharmacist before using salt substitutes.  Do not fry foods. Cook foods using healthy methods such as baking, boiling, grilling, and broiling instead.  Cook with  heart-healthy oils, such as olive, canola, soybean, or sunflower oil. Meal planning  Eat a balanced diet that includes: ? 5 or more servings of fruits and vegetables each day. At each meal, try to fill half of your plate with fruits and vegetables. ? Up to 6-8 servings of whole grains each day. ? Less than 6 oz of lean meat, poultry, or fish each day. A 3-oz serving of meat is about the same size as a deck of cards. One egg equals 1 oz. ? 2 servings of low-fat dairy each day. ? A serving of nuts, seeds, or beans 5 times each week. ? Heart-healthy fats. Healthy fats called Omega-3 fatty acids are found in foods such as flaxseeds and coldwater fish, like sardines, salmon, and mackerel.  Limit how much you eat of the following: ? Canned or prepackaged foods. ? Food that is high in trans fat, such as fried foods. ? Food that is high in saturated fat, such as fatty meat. ? Sweets, desserts, sugary drinks, and other foods with added sugar. ? Full-fat dairy products.  Do not salt foods before eating.  Try to eat at least 2 vegetarian meals each week.  Eat more home-cooked food and less restaurant, buffet, and fast food.  When eating at a restaurant, ask that your food be prepared with less salt or no salt, if possible. What foods are recommended? The items listed may not be a complete list. Talk with your dietitian about   what dietary choices are best for you. Grains Whole-grain or whole-wheat bread. Whole-grain or whole-wheat pasta. Brown rice. Oatmeal. Quinoa. Bulgur. Whole-grain and low-sodium cereals. Pita bread. Low-fat, low-sodium crackers. Whole-wheat flour tortillas. Vegetables Fresh or frozen vegetables (raw, steamed, roasted, or grilled). Low-sodium or reduced-sodium tomato and vegetable juice. Low-sodium or reduced-sodium tomato sauce and tomato paste. Low-sodium or reduced-sodium canned vegetables. Fruits All fresh, dried, or frozen fruit. Canned fruit in natural juice (without  added sugar). Meat and other protein foods Skinless chicken or turkey. Ground chicken or turkey. Pork with fat trimmed off. Fish and seafood. Egg whites. Dried beans, peas, or lentils. Unsalted nuts, nut butters, and seeds. Unsalted canned beans. Lean cuts of beef with fat trimmed off. Low-sodium, lean deli meat. Dairy Low-fat (1%) or fat-free (skim) milk. Fat-free, low-fat, or reduced-fat cheeses. Nonfat, low-sodium ricotta or cottage cheese. Low-fat or nonfat yogurt. Low-fat, low-sodium cheese. Fats and oils Soft margarine without trans fats. Vegetable oil. Low-fat, reduced-fat, or light mayonnaise and salad dressings (reduced-sodium). Canola, safflower, olive, soybean, and sunflower oils. Avocado. Seasoning and other foods Herbs. Spices. Seasoning mixes without salt. Unsalted popcorn and pretzels. Fat-free sweets. What foods are not recommended? The items listed may not be a complete list. Talk with your dietitian about what dietary choices are best for you. Grains Baked goods made with fat, such as croissants, muffins, or some breads. Dry pasta or rice meal packs. Vegetables Creamed or fried vegetables. Vegetables in a cheese sauce. Regular canned vegetables (not low-sodium or reduced-sodium). Regular canned tomato sauce and paste (not low-sodium or reduced-sodium). Regular tomato and vegetable juice (not low-sodium or reduced-sodium). Pickles. Olives. Fruits Canned fruit in a light or heavy syrup. Fried fruit. Fruit in cream or butter sauce. Meat and other protein foods Fatty cuts of meat. Ribs. Fried meat. Bacon. Sausage. Bologna and other processed lunch meats. Salami. Fatback. Hotdogs. Bratwurst. Salted nuts and seeds. Canned beans with added salt. Canned or smoked fish. Whole eggs or egg yolks. Chicken or turkey with skin. Dairy Whole or 2% milk, cream, and half-and-half. Whole or full-fat cream cheese. Whole-fat or sweetened yogurt. Full-fat cheese. Nondairy creamers. Whipped toppings.  Processed cheese and cheese spreads. Fats and oils Butter. Stick margarine. Lard. Shortening. Ghee. Bacon fat. Tropical oils, such as coconut, palm kernel, or palm oil. Seasoning and other foods Salted popcorn and pretzels. Onion salt, garlic salt, seasoned salt, table salt, and sea salt. Worcestershire sauce. Tartar sauce. Barbecue sauce. Teriyaki sauce. Soy sauce, including reduced-sodium. Steak sauce. Canned and packaged gravies. Fish sauce. Oyster sauce. Cocktail sauce. Horseradish that you find on the shelf. Ketchup. Mustard. Meat flavorings and tenderizers. Bouillon cubes. Hot sauce and Tabasco sauce. Premade or packaged marinades. Premade or packaged taco seasonings. Relishes. Regular salad dressings. Where to find more information:  National Heart, Lung, and Blood Institute: www.nhlbi.nih.gov  American Heart Association: www.heart.org Summary  The DASH eating plan is a healthy eating plan that has been shown to reduce high blood pressure (hypertension). It may also reduce your risk for type 2 diabetes, heart disease, and stroke.  With the DASH eating plan, you should limit salt (sodium) intake to 2,300 mg a day. If you have hypertension, you may need to reduce your sodium intake to 1,500 mg a day.  When on the DASH eating plan, aim to eat more fresh fruits and vegetables, whole grains, lean proteins, low-fat dairy, and heart-healthy fats.  Work with your health care provider or diet and nutrition specialist (dietitian) to adjust your eating plan to your   individual calorie needs. This information is not intended to replace advice given to you by your health care provider. Make sure you discuss any questions you have with your health care provider. Document Revised: 09/13/2017 Document Reviewed: 09/24/2016 Elsevier Patient Education  2020 Elsevier Inc.  

## 2019-12-01 NOTE — Progress Notes (Signed)
Established Patient Office Visit  Subjective:  Patient ID: Anthony Lam, male    DOB: Jun 22, 1966  Age: 54 y.o. MRN: [id number]  CC: No chief complaint on file.  Patient consents to telephone visit and 2 patient identifiers was used to identify patient.  HPI Anthony Lam presents for follow up of hypertension, prediabetes and chronic back pain. He states that he's compliant with his medication, checks his blood pressure daily, and continues to make healthy food choices and will start exercising when the weather warms up. He states that he has not checked his blood pressure today, but it was 144/95 at his appointment at [facility] yesterday. His HgbA1c done on 11/25/2019 increased from 5.8% to 5.9%. He reports mild soreness to lower back after receiving his 2nd dose of Epidural injection at [facility] on 11/30/2019. He denies bladder or bowel incontinence, saddle anesthesia and motor weakness. He denies chest pain, palpitation, light headedness, fever and chills. Overall, he states that he's doing well and offers no further complaint.  Past Medical History:  Diagnosis Date  . Hypertension   . Migraines   . Seizures ([place])   . TB (tuberculosis), treated 2009    No past surgical history on file.  Family History  Problem Relation Age of Onset  . Hypertension Mother   . Hypertension Father     Social History   Socioeconomic History  . Marital status: Single    Spouse name: Not on file  . Number of children: 2  . Years of education: Not on file  . Highest education level: 12th grade  Occupational History  . Not on file  Tobacco Use  . Smoking status: Light Tobacco Smoker    Packs/day: 0.25    Years: 15.00    Pack years: 3.75  . Smokeless tobacco: Never Used  Substance and Sexual Activity  . Alcohol use: Not Currently    Comment: Not anymore  . Drug use: Never  . Sexual activity: Yes  Other Topics Concern  . Not on file  Social History Narrative  . Not on file   Social  Determinants of Health   Financial Resource Strain:   . Difficulty of Paying Living Expenses: Not on file  Food Insecurity:   . Worried About Charity fundraiser in the Last Year: Not on file  . Ran Out of Food in the Last Year: Not on file  Transportation Needs:   . Lack of Transportation (Medical): Not on file  . Lack of Transportation (Non-Medical): Not on file  Physical Activity:   . Days of Exercise per Week: Not on file  . Minutes of Exercise per Session: Not on file  Stress:   . Feeling of Stress : Not on file  Social Connections:   . Frequency of Communication with Friends and Family: Not on file  . Frequency of Social Gatherings with Friends and Family: Not on file  . Attends Religious Services: Not on file  . Active Member of Clubs or Organizations: Not on file  . Attends Archivist Meetings: Not on file  . Marital Status: Not on file  Intimate Partner Violence:   . Fear of Current or Ex-Partner: Not on file  . Emotionally Abused: Not on file  . Physically Abused: Not on file  . Sexually Abused: Not on file    Outpatient Medications Prior to Visit  Medication Sig Dispense Refill  . amLODipine (NORVASC) 10 MG tablet Take 1 tablet (10 mg total) by mouth daily.  90 tablet 1  . losartan (COZAAR) 50 MG tablet Take 1 tablet (50 mg total) by mouth daily. Pls provide 90 day supply 90 tablet 1   No facility-administered medications prior to visit.    No Known Allergies  ROS Review of Systems  Constitutional: Negative.   Eyes: Negative.   Respiratory: Negative.   Cardiovascular: Negative.   Musculoskeletal: Positive for back pain.  Skin: Negative.   Neurological: Negative.   Psychiatric/Behavioral: Negative.       Objective:    Physical Exam No Physical exam was done BP (!) 144/95 (BP Location: Left Arm, Patient Position: Prone, Cuff Size: Large)  Wt Readings from Last 3 Encounters:  09/16/19 278 lb (126.1 kg)  08/04/19 280 lb (127 kg)  06/23/19  274 lb (124.3 kg)   He was encouraged to continue on his weight loss regimen.  Health Maintenance Due  Topic Date Due  . HIV Screening  03/30/1980  . TETANUS/TDAP  03/30/1984  . COLONOSCOPY  03/31/2015    There are no preventive care reminders to display for this patient.  Lab Results  Component Value Date   TSH 1.860 07/10/2018   Lab Results  Component Value Date   WBC 8.3 06/03/2019   HGB 15.7 06/03/2019   HCT 46.5 06/03/2019   MCV 82 06/03/2019   PLT 319 06/03/2019   Lab Results  Component Value Date   NA 139 06/03/2019   K 4.1 06/03/2019   CO2 22 06/03/2019   GLUCOSE 110 (H) 06/03/2019   BUN 23 06/03/2019   CREATININE 1.82 (H) 06/03/2019   BILITOT 0.4 06/03/2019   ALKPHOS 75 06/03/2019   AST 23 06/03/2019   ALT 27 06/03/2019   PROT 8.0 06/03/2019   ALBUMIN 4.6 06/03/2019   CALCIUM 9.5 06/03/2019   ANIONGAP 6 (L) 08/04/2013   Lab Results  Component Value Date   CHOL 162 06/03/2019   Lab Results  Component Value Date   HDL 44 06/03/2019   Lab Results  Component Value Date   LDLCALC 97 06/03/2019   Lab Results  Component Value Date   TRIG 104 06/03/2019   Lab Results  Component Value Date   CHOLHDL 3.7 06/03/2019   Lab Results  Component Value Date   HGBA1C 5.9 (H) 11/25/2019      Assessment & Plan:    1. Essential hypertension - His blood pressure is improving and he was advised that his goal should be less than 130/80. He will continue on current treatment regimen and was advised to lose weight, continue on DASH diet and exercise as tolerated. He was advised to check blood pressure daily, record and bring log to follow up appointment.  2. Prediabetes - His HgbA1c was 5.9%, he defers Metformin therapy, stating that he will make life style modifications. He was encouraged to continue on low carb/ non concentrated sweet diet and exercise as tolerated.  3. Chronic low back pain, unspecified back pain laterality, unspecified whether sciatica  present - He will continue to follow up at [facility] Neurosurgery clinic as needed.  4. Health care maintenance  - Ambulatory referral to Gastroenterology for Colonoscopy screening.    Follow-up: Return in about 3 months (around 02/25/2020), or if symptoms worsen or fail to improve.    Ibn Stief Jerold Coombe, NP

## 2019-12-08 ENCOUNTER — Other Ambulatory Visit: Payer: Self-pay

## 2019-12-08 ENCOUNTER — Telehealth: Payer: Self-pay

## 2019-12-08 DIAGNOSIS — Z1211 Encounter for screening for malignant neoplasm of colon: Secondary | ICD-10-CM

## 2019-12-08 NOTE — Telephone Encounter (Signed)
Gastroenterology Pre-Procedure Review  Request Date: Thursday 12/17/19 Requesting Physician: Dr. Vicente Males  PATIENT REVIEW QUESTIONS: The patient responded to the following health history questions as indicated:    1. Are you having any GI issues? no 2. Do you have a personal history of Polyps? no 3. Do you have a family history of Colon Cancer or Polyps? no 4. Diabetes Mellitus? no 5. Joint replacements in the past 12 months?no 6. Major health problems in the past 3 months?no 7. Any artificial heart valves, MVP, or defibrillator?no    MEDICATIONS & ALLERGIES:    Patient reports the following regarding taking any anticoagulation/antiplatelet therapy:   Plavix, Coumadin, Eliquis, Xarelto, Lovenox, Pradaxa, Brilinta, or Effient? no Aspirin? no  Patient confirms/reports the following medications:  Current Outpatient Medications  Medication Sig Dispense Refill  . amLODipine (NORVASC) 10 MG tablet Take 1 tablet (10 mg total) by mouth daily. 90 tablet 1  . losartan (COZAAR) 50 MG tablet Take 1 tablet (50 mg total) by mouth daily. Pls provide 90 day supply 90 tablet 1   No current facility-administered medications for this visit.    Patient confirms/reports the following allergies:  No Known Allergies  No orders of the defined types were placed in this encounter.   AUTHORIZATION INFORMATION Primary Insurance: 1D#: Group #:  Secondary Insurance: 1D#: Group #:  SCHEDULE INFORMATION: Date: Thursday 12/17/19 Time: Location:[facility]

## 2019-12-09 ENCOUNTER — Other Ambulatory Visit: Payer: Self-pay

## 2019-12-14 ENCOUNTER — Other Ambulatory Visit: Payer: Self-pay

## 2019-12-14 MED ORDER — NA SULFATE-K SULFATE-MG SULF 17.5-3.13-1.6 GM/177ML PO SOLN: 1.0000 | Freq: Once | ORAL | 0 refills | Status: AC

## 2019-12-15 ENCOUNTER — Telehealth: Payer: Self-pay | Admitting: Gastroenterology

## 2019-12-15 NOTE — Telephone Encounter (Signed)
Patients call has been returned.  His questions have been addressed regarding [facility] with his PCP.  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  Thanks,  [place], [place]

## 2019-12-15 NOTE — Telephone Encounter (Signed)
Pt left vm he has a scheduled procedure for 12/29/19 and has  Several questions please call pt

## 2019-12-17 ENCOUNTER — Ambulatory Visit: Payer: Self-pay

## 2019-12-17 ENCOUNTER — Other Ambulatory Visit: Payer: Self-pay

## 2019-12-24 ENCOUNTER — Ambulatory Visit: Payer: Self-pay | Admitting: Gerontology

## 2019-12-24 ENCOUNTER — Encounter: Payer: Self-pay | Admitting: Gerontology

## 2019-12-24 ENCOUNTER — Other Ambulatory Visit: Payer: Self-pay

## 2019-12-24 VITALS — BP 157/98 | HR 72 | Ht 69.0 in | Wt 280.0 lb

## 2019-12-24 DIAGNOSIS — R7303 Prediabetes: Secondary | ICD-10-CM

## 2019-12-24 DIAGNOSIS — M545 Low back pain, unspecified: Secondary | ICD-10-CM

## 2019-12-24 DIAGNOSIS — G8929 Other chronic pain: Secondary | ICD-10-CM

## 2019-12-24 DIAGNOSIS — I10 Essential (primary) hypertension: Secondary | ICD-10-CM

## 2019-12-24 NOTE — Progress Notes (Signed)
Established Patient Office Visit  Subjective:  Patient ID: Anthony Lam, male    DOB: [birth year]  Age: 54 y.o. MRN: [id number]  CC:  Chief Complaint  Patient presents with  . Hypertension  . Arthritis    back, making sleeping difficult    HPI Anthony Lam presents for follow up of hypertension, back pain and lab review. He reports that he's compliant with his medication, and continues to work on making healthy lifestyle changes. He checks his blood pressure at home and he states that it runs in the low to mid 140's/ low to mid 90's. He states that he received his second Epidural injection at [facility] on 11/30/2019. Currently, he states that he continues to experience intermittent non radiating dull 6/10 pain to his lower back. He denies bowel or bladder incontinence and saddle anesthesia. His HgbA1c done on 11/25/2019 increased from 5.8% to 5.9%. He denies chest pain, light headedness, palpitation, fever and chills. Overall he states that he is doing well and offers no further complaint.  Past Medical History:  Diagnosis Date  . Hypertension   . Migraines   . Seizures ([place])   . TB (tuberculosis), treated 2009    History reviewed. No pertinent surgical history.  Family History  Problem Relation Age of Onset  . Hypertension Mother   . Hypertension Father     Social History   Socioeconomic History  . Marital status: Single    Spouse name: Not on file  . Number of children: 2  . Years of education: Not on file  . Highest education level: 12th grade  Occupational History  . Not on file  Tobacco Use  . Smoking status: Light Tobacco Smoker    Packs/day: 0.25    Years: 15.00    Pack years: 3.75  . Smokeless tobacco: Never Used  Substance and Sexual Activity  . Alcohol use: Not Currently    Comment: Not anymore  . Drug use: Never  . Sexual activity: Yes  Other Topics Concern  . Not on file  Social History Narrative  . Not on file   Social Determinants of Health    Financial Resource Strain:   . Difficulty of Paying Living Expenses:   Food Insecurity:   . Worried About Charity fundraiser in the Last Year:   . Arboriculturist in the Last Year:   Transportation Needs:   . Film/video editor (Medical):   Marland Kitchen Lack of Transportation (Non-Medical):   Physical Activity:   . Days of Exercise per Week:   . Minutes of Exercise per Session:   Stress:   . Feeling of Stress :   Social Connections:   . Frequency of Communication with Friends and Family:   . Frequency of Social Gatherings with Friends and Family:   . Attends Religious Services:   . Active Member of Clubs or Organizations:   . Attends Archivist Meetings:   Marland Kitchen Marital Status:   Intimate Partner Violence:   . Fear of Current or Ex-Partner:   . Emotionally Abused:   Marland Kitchen Physically Abused:   . Sexually Abused:     Outpatient Medications Prior to Visit  Medication Sig Dispense Refill  . amLODipine (NORVASC) 10 MG tablet Take 1 tablet (10 mg total) by mouth daily. 90 tablet 1  . gabapentin (NEURONTIN) 100 MG capsule Take 100 mg by mouth at bedtime.     Marland Kitchen losartan (COZAAR) 50 MG tablet Take 1 tablet (50 mg total)  by mouth daily. Pls provide 90 day supply 90 tablet 1   No facility-administered medications prior to visit.    No Known Allergies  ROS Review of Systems  Constitutional: Negative.   Eyes: Negative.   Respiratory: Negative.   Cardiovascular: Negative.   Genitourinary: Negative.   Musculoskeletal: Positive for back pain (chronic back pain).  Neurological: Negative.   Psychiatric/Behavioral: Negative.       Objective:    Physical Exam  Constitutional: He is oriented to person, place, and time. He appears well-developed.  HENT:  Head: Normocephalic and atraumatic.  Eyes: Pupils are equal, round, and reactive to light. EOM are normal.  Cardiovascular: Normal rate and regular rhythm.  Pulmonary/Chest: Effort normal and breath sounds normal.  Neurological:  He is oriented to person, place, and time.  Psychiatric: He has a normal mood and affect. His behavior is normal. Thought content normal.    BP (!) 157/98 (BP Location: Left Arm, Patient Position: Sitting)   Pulse 72   Ht '5\' 9"'  (1.753 m)   Wt 280 lb (127 kg)   SpO2 97%   BMI 41.35 kg/m  Wt Readings from Last 3 Encounters:  12/24/19 280 lb (127 kg)  09/16/19 278 lb (126.1 kg)  08/04/19 280 lb (127 kg)   He was encouraged to lose weight.  Health Maintenance Due  Topic Date Due  . HIV Screening  Never done  . TETANUS/TDAP  Never done  . COLONOSCOPY  Never done    There are no preventive care reminders to display for this patient.  Lab Results  Component Value Date   TSH 1.860 07/10/2018   Lab Results  Component Value Date   WBC 8.3 06/03/2019   HGB 15.7 06/03/2019   HCT 46.5 06/03/2019   MCV 82 06/03/2019   PLT 319 06/03/2019   Lab Results  Component Value Date   NA 139 06/03/2019   K 4.1 06/03/2019   CO2 22 06/03/2019   GLUCOSE 110 (H) 06/03/2019   BUN 23 06/03/2019   CREATININE 1.82 (H) 06/03/2019   BILITOT 0.4 06/03/2019   ALKPHOS 75 06/03/2019   AST 23 06/03/2019   ALT 27 06/03/2019   PROT 8.0 06/03/2019   ALBUMIN 4.6 06/03/2019   CALCIUM 9.5 06/03/2019   ANIONGAP 6 (L) 08/04/2013   Lab Results  Component Value Date   CHOL 162 06/03/2019   Lab Results  Component Value Date   HDL 44 06/03/2019   Lab Results  Component Value Date   LDLCALC 97 06/03/2019   Lab Results  Component Value Date   TRIG 104 06/03/2019   Lab Results  Component Value Date   CHOLHDL 3.7 06/03/2019   Lab Results  Component Value Date   HGBA1C 5.9 (H) 11/25/2019      Assessment & Plan:   1. Essential hypertension - His blood pressure is not controlled, he will continue on his current treatment regimen and was strongly encouraged to lose weight, exercise and adhere to DASH diet. His goal blood pressure is 130/80 due to his stage 3 kidney disease. - Comp Met  (CMET); Future - Urinalysis; Future  2. Chronic low back pain, unspecified back pain laterality, unspecified whether sciatica present - He was advised to call and schedule a follow up appointment with [facility]. He was advised also to go to the ED with worsening symptoms.  3. Prediabetes - His HgbA1c was 5.9%, he deferred metformin therapy, states that he will make life style changes.  Follow-up: Return in about 2 months (around 02/23/2020), or if symptoms worsen or fail to improve.    Britany Callicott Jerold Coombe, NP

## 2019-12-24 NOTE — Patient Instructions (Signed)
Managing Your Hypertension Hypertension is commonly called high blood pressure. This is when the force of your blood pressing against the walls of your arteries is too strong. Arteries are blood vessels that carry blood from your heart throughout your body. Hypertension forces the heart to work harder to pump blood, and may cause the arteries to become narrow or stiff. Having untreated or uncontrolled hypertension can cause heart attack, stroke, kidney disease, and other problems. What are blood pressure readings? A blood pressure reading consists of a higher number over a lower number. Ideally, your blood pressure should be below 120/80. The first ("top") number is called the systolic pressure. It is a measure of the pressure in your arteries as your heart beats. The second ("bottom") number is called the diastolic pressure. It is a measure of the pressure in your arteries as the heart relaxes. What does my blood pressure reading mean? Blood pressure is classified into four stages. Based on your blood pressure reading, your health care provider may use the following stages to determine what type of treatment you need, if any. Systolic pressure and diastolic pressure are measured in a unit called mm Hg. Normal  Systolic pressure: below 120.  Diastolic pressure: below 80. Elevated  Systolic pressure: 120-129.  Diastolic pressure: below 80. Hypertension stage 1  Systolic pressure: 130-139.  Diastolic pressure: 80-89. Hypertension stage 2  Systolic pressure: 140 or above.  Diastolic pressure: 90 or above. What health risks are associated with hypertension? Managing your hypertension is an important responsibility. Uncontrolled hypertension can lead to:  A heart attack.  A stroke.  A weakened blood vessel (aneurysm).  Heart failure.  Kidney damage.  Eye damage.  Metabolic syndrome.  Memory and concentration problems. What changes can I make to manage my  hypertension? Hypertension can be managed by making lifestyle changes and possibly by taking medicines. Your health care provider will help you make a plan to bring your blood pressure within a normal range. Eating and drinking   Eat a diet that is high in fiber and potassium, and low in salt (sodium), added sugar, and fat. An example eating plan is called the DASH (Dietary Approaches to Stop Hypertension) diet. To eat this way: ? Eat plenty of fresh fruits and vegetables. Try to fill half of your plate at each meal with fruits and vegetables. ? Eat whole grains, such as whole wheat pasta, brown rice, or whole grain bread. Fill about one quarter of your plate with whole grains. ? Eat low-fat diary products. ? Avoid fatty cuts of meat, processed or cured meats, and poultry with skin. Fill about one quarter of your plate with lean proteins such as fish, chicken without skin, beans, eggs, and tofu. ? Avoid premade and processed foods. These tend to be higher in sodium, added sugar, and fat.  Reduce your daily sodium intake. Most people with hypertension should eat less than 1,500 mg of sodium a day.  Limit alcohol intake to no more than 1 drink a day for nonpregnant women and 2 drinks a day for men. One drink equals 12 oz of beer, 5 oz of wine, or 1 oz of hard liquor. Lifestyle  Work with your health care provider to maintain a healthy body weight, or to lose weight. Ask what an ideal weight is for you.  Get at least 30 minutes of exercise that causes your heart to beat faster (aerobic exercise) most days of the week. Activities may include walking, swimming, or biking.  Include exercise   to strengthen your muscles (resistance exercise), such as weight lifting, as part of your weekly exercise routine. Try to do these types of exercises for 30 minutes at least 3 days a week.  Do not use any products that contain nicotine or tobacco, such as cigarettes and e-cigarettes. If you need help quitting,  ask your health care provider.  Control any long-term (chronic) conditions you have, such as high cholesterol or diabetes. Monitoring  Monitor your blood pressure at home as told by your health care provider. Your personal target blood pressure may vary depending on your medical conditions, your age, and other factors.  Have your blood pressure checked regularly, as often as told by your health care provider. Working with your health care provider  Review all the medicines you take with your health care provider because there may be side effects or interactions.  Talk with your health care provider about your diet, exercise habits, and other lifestyle factors that may be contributing to hypertension.  Visit your health care provider regularly. Your health care provider can help you create and adjust your plan for managing hypertension. Will I need medicine to control my blood pressure? Your health care provider may prescribe medicine if lifestyle changes are not enough to get your blood pressure under control, and if:  Your systolic blood pressure is 130 or higher.  Your diastolic blood pressure is 80 or higher. Take medicines only as told by your health care provider. Follow the directions carefully. Blood pressure medicines must be taken as prescribed. The medicine does not work as well when you skip doses. Skipping doses also puts you at risk for problems. Contact a health care provider if:  You think you are having a reaction to medicines you have taken.  You have repeated (recurrent) headaches.  You feel dizzy.  You have swelling in your ankles.  You have trouble with your vision. Get help right away if:  You develop a severe headache or confusion.  You have unusual weakness or numbness, or you feel faint.  You have severe pain in your chest or abdomen.  You vomit repeatedly.  You have trouble breathing. Summary  Hypertension is when the force of blood pumping  through your arteries is too strong. If this condition is not controlled, it may put you at risk for serious complications.  Your personal target blood pressure may vary depending on your medical conditions, your age, and other factors. For most people, a normal blood pressure is less than 120/80.  Hypertension is managed by lifestyle changes, medicines, or both. Lifestyle changes include weight loss, eating a healthy, low-sodium diet, exercising more, and limiting alcohol. This information is not intended to replace advice given to you by your health care provider. Make sure you discuss any questions you have with your health care provider. Document Revised: 01/23/2019 Document Reviewed: 08/29/2016 Elsevier Patient Education  2020 [employer].  Prediabetes Eating Plan Prediabetes is a condition that causes blood sugar (glucose) levels to be higher than normal. This increases the risk for developing diabetes. In order to prevent diabetes from developing, your health care provider may recommend a diet and other lifestyle changes to help you:  Control your blood glucose levels.  Improve your cholesterol levels.  Manage your blood pressure. Your health care provider may recommend working with a diet and nutrition specialist (dietitian) to make a meal plan that is best for you. What are tips for following this plan? Lifestyle  Set weight loss goals with the  help of your health care team. It is recommended that most people with prediabetes lose 7% of their current body weight.  Exercise for at least 30 minutes at least 5 days a week.  Attend a support group or seek ongoing support from a mental health counselor.  Take over-the-counter and prescription medicines only as told by your health care provider. Reading food labels  Read food labels to check the amount of fat, salt (sodium), and sugar in prepackaged foods. Avoid foods that have: ? Saturated fats. ? Trans fats. ? Added  sugars.  Avoid foods that have more than 300 milligrams (mg) of sodium per serving. Limit your daily sodium intake to less than 2,300 mg each day. Shopping  Avoid buying pre-made and processed foods. Cooking  Cook with olive oil. Do not use butter, lard, or ghee.  Bake, broil, grill, or boil foods. Avoid frying. Meal planning   Work with your dietitian to develop an eating plan that is right for you. This may include: ? Tracking how many calories you take in. Use a food diary, notebook, or mobile application to track what you eat at each meal. ? Using the glycemic index (GI) to plan your meals. The index tells you how quickly a food will raise your blood glucose. Choose low-GI foods. These foods take a longer time to raise blood glucose.  Consider following a Mediterranean diet. This diet includes: ? Several servings each day of fresh fruits and vegetables. ? Eating fish at least twice a week. ? Several servings each day of whole grains, beans, nuts, and seeds. ? Using olive oil instead of other fats. ? Moderate alcohol consumption. ? Eating small amounts of red meat and whole-fat dairy.  If you have high blood pressure, you may need to limit your sodium intake or follow a diet such as the DASH eating plan. DASH is an eating plan that aims to lower high blood pressure. What foods are recommended? The items listed below may not be a complete list. Talk with your dietitian about what dietary choices are best for you. Grains Whole grains, such as whole-wheat or whole-grain breads, crackers, cereals, and pasta. Unsweetened oatmeal. Bulgur. Barley. Quinoa. Brown rice. Corn or whole-wheat flour tortillas or taco shells. Vegetables Lettuce. Spinach. Peas. Beets. Cauliflower. Cabbage. Broccoli. Carrots. Tomatoes. Squash. Eggplant. Herbs. Peppers. Onions. Cucumbers. Brussels sprouts. Fruits Berries. Bananas. Apples. Oranges. Grapes. Papaya. Mango. Pomegranate. Kiwi. Grapefruit.  Cherries. Meats and other protein foods Seafood. Poultry without skin. Lean cuts of pork and beef. Tofu. Eggs. Nuts. Beans. Dairy Low-fat or fat-free dairy products, such as yogurt, cottage cheese, and cheese. Beverages Water. Tea. Coffee. Sugar-free or diet soda. Seltzer water. Lowfat or no-fat milk. Milk alternatives, such as soy or almond milk. Fats and oils Olive oil. Canola oil. Sunflower oil. Grapeseed oil. Avocado. Walnuts. Sweets and desserts Sugar-free or low-fat pudding. Sugar-free or low-fat ice cream and other frozen treats. Seasoning and other foods Herbs. Sodium-free spices. Mustard. Relish. Low-fat, low-sugar ketchup. Low-fat, low-sugar barbecue sauce. Low-fat or fat-free mayonnaise. What foods are not recommended? The items listed below may not be a complete list. Talk with your dietitian about what dietary choices are best for you. Grains Refined white flour and flour products, such as bread, pasta, snack foods, and cereals. Vegetables Canned vegetables. Frozen vegetables with butter or cream sauce. Fruits Fruits canned with syrup. Meats and other protein foods Fatty cuts of meat. Poultry with skin. Breaded or fried meat. Processed meats. Dairy Full-fat yogurt, cheese, or milk.  Beverages Sweetened drinks, such as sweet iced tea and soda. Fats and oils Butter. Lard. Ghee. Sweets and desserts Baked goods, such as cake, cupcakes, pastries, cookies, and cheesecake. Seasoning and other foods Spice mixes with added salt. Ketchup. Barbecue sauce. Mayonnaise. Summary  To prevent diabetes from developing, you may need to make diet and other lifestyle changes to help control blood sugar, improve cholesterol levels, and manage your blood pressure.  Set weight loss goals with the help of your health care team. It is recommended that most people with prediabetes lose 7 percent of their current body weight.  Consider following a Mediterranean diet that includes plenty of  fresh fruits and vegetables, whole grains, beans, nuts, seeds, fish, lean meat, low-fat dairy, and healthy oils. This information is not intended to replace advice given to you by your health care provider. Make sure you discuss any questions you have with your health care provider. Document Revised: 01/23/2019 Document Reviewed: 12/05/2016 Elsevier Patient Education  2020 [employer].

## 2019-12-25 ENCOUNTER — Telehealth: Payer: Self-pay

## 2019-12-25 ENCOUNTER — Other Ambulatory Visit: Admission: RE | Admit: 2019-12-25 | Payer: Self-pay | Source: Ambulatory Visit

## 2019-12-25 NOTE — Telephone Encounter (Signed)
Patient has been contacted to offer a sample of PlenVu Bowel Prep for his colonoscopy on Tuesday.  He will stop by on Monday to pick it up. He is unsure if he will be able to go for his COVID test today.  I've asked him to call and LVM with specific dates if he is unable to go today for COVID.  Thanks,  [place], [place]

## 2019-12-28 ENCOUNTER — Telehealth: Payer: Self-pay | Admitting: Gastroenterology

## 2019-12-28 NOTE — Telephone Encounter (Signed)
Pt left vm to r/s his procedure due to missing his Covid19 test

## 2019-12-28 NOTE — Telephone Encounter (Signed)
Pt left vm for Sharyn Lull due to not having his covid test he would like a call please

## 2019-12-28 NOTE — Telephone Encounter (Signed)
Returned patients call.  His colonoscopy has been rescheduled to Tuesday 01/26/20 with Dr. Vicente Males.  Patient advised of COVID test to be done at [facility] on [facility] Friday 01/22/20 between hours of 10:30am and 12:30pm. He has been asked to stop by the office to pick up a SuPrep Bowel Kit.  He said he will come by today to pick up, along with a copy of his new instructions.  Thanks,  [place], [place]

## 2020-01-08 ENCOUNTER — Telehealth: Payer: Self-pay | Admitting: Pharmacy Technician

## 2020-01-08 NOTE — Telephone Encounter (Signed)
Received updated proof of income.  Patient eligible to receive medication assistance at [facility] until time for re-certification in 9359, and as long as eligibility requirements continue to be met.  [facility]

## 2020-01-22 ENCOUNTER — Other Ambulatory Visit
Admission: RE | Admit: 2020-01-22 | Discharge: 2020-01-22 | Disposition: A | Discharge status: Home / Self Care | Payer: HRSA Program | Source: Ambulatory Visit | Attending: Gastroenterology | Admitting: Gastroenterology

## 2020-01-22 ENCOUNTER — Other Ambulatory Visit: Payer: Self-pay

## 2020-01-22 DIAGNOSIS — Z01812 Encounter for preprocedural laboratory examination: Secondary | ICD-10-CM | POA: Diagnosis present

## 2020-01-22 DIAGNOSIS — Z20822 Contact with and (suspected) exposure to covid-19: Secondary | ICD-10-CM | POA: Insufficient documentation

## 2020-01-22 LAB — SARS CORONAVIRUS 2 (TAT 6-24 HRS): SARS Coronavirus 2: NEGATIVE

## 2020-01-26 ENCOUNTER — Ambulatory Visit
Admission: RE | Admit: 2020-01-26 | Discharge: 2020-01-26 | Disposition: A | Discharge status: Home / Self Care | Payer: Self-pay | Attending: Gastroenterology | Admitting: Gastroenterology

## 2020-01-26 ENCOUNTER — Ambulatory Visit: Payer: Self-pay | Admitting: Anesthesiology

## 2020-01-26 ENCOUNTER — Encounter: Payer: Self-pay | Admitting: Gastroenterology

## 2020-01-26 ENCOUNTER — Encounter
Admission: RE | Disposition: A | Discharge status: Home / Self Care | Payer: Self-pay | Source: Home / Self Care | Attending: Gastroenterology

## 2020-01-26 DIAGNOSIS — Z1211 Encounter for screening for malignant neoplasm of colon: Secondary | ICD-10-CM | POA: Insufficient documentation

## 2020-01-26 DIAGNOSIS — R569 Unspecified convulsions: Secondary | ICD-10-CM | POA: Insufficient documentation

## 2020-01-26 DIAGNOSIS — D122 Benign neoplasm of ascending colon: Secondary | ICD-10-CM | POA: Insufficient documentation

## 2020-01-26 DIAGNOSIS — K635 Polyp of colon: Secondary | ICD-10-CM

## 2020-01-26 DIAGNOSIS — K5730 Diverticulosis of large intestine without perforation or abscess without bleeding: Secondary | ICD-10-CM | POA: Insufficient documentation

## 2020-01-26 DIAGNOSIS — G4730 Sleep apnea, unspecified: Secondary | ICD-10-CM | POA: Insufficient documentation

## 2020-01-26 DIAGNOSIS — N189 Chronic kidney disease, unspecified: Secondary | ICD-10-CM | POA: Insufficient documentation

## 2020-01-26 DIAGNOSIS — F17210 Nicotine dependence, cigarettes, uncomplicated: Secondary | ICD-10-CM | POA: Insufficient documentation

## 2020-01-26 DIAGNOSIS — Z79899 Other long term (current) drug therapy: Secondary | ICD-10-CM | POA: Insufficient documentation

## 2020-01-26 DIAGNOSIS — Z6841 Body Mass Index (BMI) 40.0 and over, adult: Secondary | ICD-10-CM | POA: Insufficient documentation

## 2020-01-26 DIAGNOSIS — I129 Hypertensive chronic kidney disease with stage 1 through stage 4 chronic kidney disease, or unspecified chronic kidney disease: Secondary | ICD-10-CM | POA: Insufficient documentation

## 2020-01-26 HISTORY — PX: COLONOSCOPY WITH PROPOFOL: SHX5780

## 2020-01-26 HISTORY — DX: Sleep apnea, unspecified: G47.30

## 2020-01-26 SURGERY — COLONOSCOPY WITH PROPOFOL
Anesthesia: General

## 2020-01-26 MED ORDER — PROPOFOL 500 MG/50ML IV EMUL
INTRAVENOUS | Status: AC
  Filled 2020-01-26: qty 50

## 2020-01-26 MED ORDER — PROPOFOL 500 MG/50ML IV EMUL
INTRAVENOUS | Status: DC | PRN
  Administered 2020-01-26: 150 ug/kg/min via INTRAVENOUS

## 2020-01-26 MED ORDER — FENTANYL CITRATE (PF) 100 MCG/2ML IJ SOLN
INTRAMUSCULAR | Status: AC
  Filled 2020-01-26: qty 2

## 2020-01-26 MED ORDER — MIDAZOLAM HCL 2 MG/2ML IJ SOLN
INTRAMUSCULAR | Status: AC
  Filled 2020-01-26: qty 2

## 2020-01-26 MED ORDER — EPHEDRINE 5 MG/ML INJ
INTRAVENOUS | Status: AC
  Filled 2020-01-26: qty 4

## 2020-01-26 MED ORDER — SODIUM CHLORIDE 0.9 % IV SOLN
INTRAVENOUS | Status: DC
  Administered 2020-01-26: 1000 mL via INTRAVENOUS

## 2020-01-26 MED ORDER — FENTANYL CITRATE (PF) 100 MCG/2ML IJ SOLN
INTRAMUSCULAR | Status: DC | PRN
  Administered 2020-01-26: 100 ug via INTRAVENOUS

## 2020-01-26 MED ORDER — EPHEDRINE SULFATE 50 MG/ML IJ SOLN
INTRAMUSCULAR | Status: DC | PRN
  Administered 2020-01-26: 10 mg via INTRAVENOUS
  Administered 2020-01-26 (×2): 5 mg via INTRAVENOUS

## 2020-01-26 MED ORDER — MIDAZOLAM HCL 2 MG/2ML IJ SOLN
INTRAMUSCULAR | Status: DC | PRN
  Administered 2020-01-26: 2 mg via INTRAVENOUS

## 2020-01-26 NOTE — Anesthesia Postprocedure Evaluation (Signed)
Anesthesia Post Note  Patient: Anthony Lam  Procedure(s) Performed: COLONOSCOPY WITH PROPOFOL (N/A )  Patient location during evaluation: Endoscopy Anesthesia Type: General Level of consciousness: awake and alert Pain management: pain level controlled Vital Signs Assessment: post-procedure vital signs reviewed and stable Respiratory status: spontaneous breathing, nonlabored ventilation, respiratory function stable and patient connected to nasal cannula oxygen Cardiovascular status: blood pressure returned to baseline and stable Postop Assessment: no apparent nausea or vomiting Anesthetic complications: no     Last Vitals:  Vitals:   01/26/20 1050 01/26/20 1059  BP: 131/69 120/83  Pulse:    Resp:    Temp:    SpO2:      Last Pain:  Vitals:   01/26/20 1119  TempSrc:   PainSc: 0-No pain                 Martha Clan

## 2020-01-26 NOTE — Anesthesia Preprocedure Evaluation (Signed)
Anesthesia Evaluation  Patient identified by MRN, date of birth, ID band Patient awake    Reviewed: Allergy & Precautions, H&P , NPO status , Patient's Chart, lab work & pertinent test results, reviewed documented beta blocker date and time   History of Anesthesia Complications Negative for: history of anesthetic complications  Airway Mallampati: III  TM Distance: >3 FB Neck ROM: full    Dental  (+) Dental Advidsory Given, Teeth Intact, Missing   Pulmonary neg shortness of breath, sleep apnea and Continuous Positive Airway Pressure Ventilation , neg COPD, neg recent URI, Current Smoker,    Pulmonary exam normal breath sounds clear to auscultation       Cardiovascular Exercise Tolerance: Good hypertension, (-) angina(-) Past MI and (-) Cardiac Stents Normal cardiovascular exam(-) dysrhythmias (-) Valvular Problems/Murmurs Rhythm:regular Rate:Normal     Neuro/Psych  Headaches, Seizures - (as a child),  negative psych ROS   GI/Hepatic negative GI ROS, Neg liver ROS,   Endo/Other  neg diabetesMorbid obesity  Renal/GU CRFRenal disease  negative genitourinary   Musculoskeletal   Abdominal   Peds  Hematology negative hematology ROS (+)   Anesthesia Other Findings Past Medical History: No date: Hypertension No date: Migraines No date: Seizures ([place]) No date: Sleep apnea 2009: TB (tuberculosis), treated   Reproductive/Obstetrics negative OB ROS                             Anesthesia Physical Anesthesia Plan  ASA: III  Anesthesia Plan: General   Post-op Pain Management:    Induction: Intravenous  PONV Risk Score and Plan: 1 and Propofol infusion and TIVA  Airway Management Planned: Natural Airway and Nasal Cannula  Additional Equipment:   Intra-op Plan:   Post-operative Plan:   Informed Consent: I have reviewed the patients History and Physical, chart, labs and discussed the  procedure including the risks, benefits and alternatives for the proposed anesthesia with the patient or authorized representative who has indicated his/her understanding and acceptance.     Dental Advisory Given  Plan Discussed with: Anesthesiologist, CRNA and Surgeon  Anesthesia Plan Comments:         Anesthesia Quick Evaluation

## 2020-01-26 NOTE — Anesthesia Procedure Notes (Signed)
Performed by: Vaughan Sine Pre-anesthesia Checklist: Patient identified, Emergency Drugs available, Suction available, Patient being monitored and Timeout performed Patient Re-evaluated:Patient Re-evaluated prior to induction Oxygen Delivery Method: Simple face mask Preoxygenation: Pre-oxygenation with 100% oxygen Induction Type: IV induction Ventilation: Oral airway inserted - appropriate to patient size Airway Equipment and Method: Oral airway Difficulty Due To: Difficult Airway- due to large tongue

## 2020-01-26 NOTE — Op Note (Signed)
[facility] Gastroenterology Patient Name: Anthony Lam Procedure Date: 01/26/2020 10:05 AM MRN: [id number] Account #: [id number] Date of Birth: Jun 08, 1966 Admit Type: Outpatient Age: 54 Room: [facility] ENDO ROOM 1 Gender: Male Note Status: Finalized Procedure:             Colonoscopy Indications:           Screening for colorectal malignant neoplasm Providers:             Jonathon Bellows MD, MD Referring MD:          No Local Md, MD (Referring MD) Medicines:             Monitored Anesthesia Care Complications:         No immediate complications. Procedure:             Pre-Anesthesia Assessment:                        - Prior to the procedure, a History and Physical was                         performed, and patient medications, allergies and                         sensitivities were reviewed. The patient's tolerance                         of previous anesthesia was reviewed.                        - The risks and benefits of the procedure and the                         sedation options and risks were discussed with the                         patient. All questions were answered and informed                         consent was obtained.                        - ASA Grade Assessment: II - A patient with mild                         systemic disease.                        After obtaining informed consent, the colonoscope was                         passed under direct vision. Throughout the procedure,                         the patient's blood pressure, pulse, and oxygen                         saturations were monitored continuously. The                         Colonoscope was introduced through the anus  and                         advanced to the the cecum, identified by the                         appendiceal orifice. The colonoscopy was performed                         with ease. The patient tolerated the procedure well.                         The quality of the  bowel preparation was excellent. Findings:      The perianal and digital rectal examinations were normal.      Multiple small-mouthed diverticula were found in the left colon.      Two sessile polyps were found in the ascending colon. The polyps were 3       to 4 mm in size. These polyps were removed with a cold biopsy forceps.       Resection and retrieval were complete.      Two sessile polyps were found in the ascending colon. The polyps were 4       to 5 mm in size. These polyps were removed with a cold snare. Resection       and retrieval were complete.      The exam was otherwise without abnormality on direct and retroflexion       views. Impression:            - Diverticulosis in the left colon.                        - Two 3 to 4 mm polyps in the ascending colon, removed                         with a cold biopsy forceps. Resected and retrieved.                        - Two 4 to 5 mm polyps in the ascending colon, removed                         with a cold snare. Resected and retrieved.                        - The examination was otherwise normal on direct and                         retroflexion views. Recommendation:        - Discharge patient to home (with escort).                        - Resume previous diet.                        - Continue present medications.                        - Await pathology results.                        -  Repeat colonoscopy in 3 years for surveillance. Procedure Code(s):     --- Professional ---                        [id number], Colonoscopy, flexible; with removal of                         tumor(s), polyp(s), or other lesion(s) by snare                         technique                        45380, 34, Colonoscopy, flexible; with biopsy, single                         or multiple Diagnosis Code(s):     --- Professional ---                        Z12.11, Encounter for screening for malignant neoplasm                         of colon                         K63.5, Polyp of colon                        K57.30, Diverticulosis of large intestine without                         perforation or abscess without bleeding CPT copyright 2019 [employer]. All rights reserved. The codes documented in this report are preliminary and upon coder review may  be revised to meet current compliance requirements. Jonathon Bellows, MD Jonathon Bellows MD, MD 01/26/2020 10:37:15 AM This report has been signed electronically. Number of Addenda: 0 Note Initiated On: 01/26/2020 10:05 AM Scope Withdrawal Time: 0 hours 13 minutes 32 seconds  Total Procedure Duration: 0 hours 15 minutes 34 seconds  Estimated Blood Loss:  Estimated blood loss: none.      [facility]

## 2020-01-26 NOTE — Transfer of Care (Signed)
Immediate Anesthesia Transfer of Care Note  Patient: NYCHOLAS PEOT  Procedure(s) Performed: COLONOSCOPY WITH PROPOFOL (N/A )  Patient Location: PACU  Anesthesia Type:General  Level of Consciousness: awake and sedated  Airway & Oxygen Therapy: Patient Spontanous Breathing and Patient connected to face mask oxygen  Post-op Assessment: Report given to RN and Post -op Vital signs reviewed and stable  Post vital signs: Reviewed and stable  Last Vitals:  Vitals Value Taken Time  BP 80/35 01/26/20 1041  Temp 36.6 C 01/26/20 1039  Pulse 88 01/26/20 1043  Resp 5 01/26/20 1043  SpO2 94 % 01/26/20 1043  Vitals shown include unvalidated device data.  Last Pain:  Vitals:   01/26/20 1039  TempSrc: Temporal  PainSc: Asleep         Complications: No apparent anesthesia complications

## 2020-01-26 NOTE — H&P (Signed)
Jonathon Bellows, MD [address], [place], [place], [place], [address] [address], [place], [place], [place], [address] Phone: [phone]  Fax: [phone]  Primary Care Physician:  Langston Reusing, NP   Pre-Procedure History & Physical: HPI:  TANIELA KOLESNIK is a 54 y.o. male is here for an colonoscopy.   Past Medical History:  Diagnosis Date  . Hypertension   . Migraines   . Seizures ([place])   . Sleep apnea   . TB (tuberculosis), treated 2009    Past Surgical History:  Procedure Laterality Date  . APPENDECTOMY      Prior to Admission medications   Medication Sig Start Date End Date Taking? Authorizing Provider  amLODipine (NORVASC) 10 MG tablet Take 1 tablet (10 mg total) by mouth daily. 09/16/19  Yes Iloabachie, Chioma E, NP  gabapentin (NEURONTIN) 100 MG capsule Take 100 mg by mouth at bedtime.  04/24/19 04/23/20 Yes [provider]  losartan (COZAAR) 50 MG tablet Take 1 tablet (50 mg total) by mouth daily. Pls provide 90 day supply 09/16/19  Yes Iloabachie, Chioma E, NP    Allergies as of 12/08/2019  . (No Known Allergies)    Family History  Problem Relation Age of Onset  . Hypertension Mother   . Hypertension Father     Social History   Socioeconomic History  . Marital status: Single    Spouse name: Not on file  . Number of children: 2  . Years of education: Not on file  . Highest education level: 12th grade  Occupational History  . Not on file  Tobacco Use  . Smoking status: Light Tobacco Smoker    Packs/day: 0.25    Years: 15.00    Pack years: 3.75  . Smokeless tobacco: Never Used  Substance and Sexual Activity  . Alcohol use: Not Currently    Comment: Not anymore  . Drug use: Never  . Sexual activity: Yes  Other Topics Concern  . Not on file  Social History Narrative  . Not on file   Social Determinants of Health   Financial Resource Strain:   . Difficulty of Paying Living Expenses:   Food Insecurity:   . Worried  About Charity fundraiser in the Last Year:   . Arboriculturist in the Last Year:   Transportation Needs:   . Film/video editor (Medical):   Marland Kitchen Lack of Transportation (Non-Medical):   Physical Activity:   . Days of Exercise per Week:   . Minutes of Exercise per Session:   Stress:   . Feeling of Stress :   Social Connections:   . Frequency of Communication with Friends and Family:   . Frequency of Social Gatherings with Friends and Family:   . Attends Religious Services:   . Active Member of Clubs or Organizations:   . Attends Archivist Meetings:   Marland Kitchen Marital Status:   Intimate Partner Violence:   . Fear of Current or Ex-Partner:   . Emotionally Abused:   Marland Kitchen Physically Abused:   . Sexually Abused:     Review of Systems: See HPI, otherwise negative ROS  Physical Exam: BP (!) 135/105   Pulse 69   Temp 97.6 F (36.4 C) (Tympanic)   Resp 18   Ht 5\' 9"  (1.753 m)   Wt 126.1 kg   SpO2 99%   BMI 41.05 kg/m  General:   Alert,  pleasant and cooperative in NAD Head:  Normocephalic and  atraumatic. Neck:  Supple; no masses or thyromegaly. Lungs:  Clear throughout to auscultation, normal respiratory effort.    Heart:  +S1, +S2, Regular rate and rhythm, No edema. Abdomen:  Soft, nontender and nondistended. Normal bowel sounds, without guarding, and without rebound.   Neurologic:  Alert and  oriented x4;  grossly normal neurologically.  Impression/Plan: RYOSUKE DENATALE is here for an colonoscopy to be performed for Screening colonoscopy average risk   Risks, benefits, limitations, and alternatives regarding  colonoscopy have been reviewed with the patient.  Questions have been answered.  All parties agreeable.   Jonathon Bellows, MD  01/26/2020, 10:04 AM

## 2020-01-27 ENCOUNTER — Encounter: Payer: Self-pay | Admitting: Gastroenterology

## 2020-01-27 ENCOUNTER — Encounter: Payer: Self-pay | Admitting: *Deleted

## 2020-01-27 LAB — SURGICAL PATHOLOGY

## 2020-02-17 ENCOUNTER — Other Ambulatory Visit: Payer: Self-pay

## 2020-02-17 DIAGNOSIS — I10 Essential (primary) hypertension: Secondary | ICD-10-CM

## 2020-02-17 NOTE — Addendum Note (Signed)
Addended by: Fabiola Backer on: 02/17/2020 11:53 AM   Modules accepted: Orders

## 2020-02-18 LAB — SPECIMEN STATUS

## 2020-02-18 LAB — SPECIMEN STATUS REPORT

## 2020-02-19 LAB — URINALYSIS
Bilirubin, UA: NEGATIVE
Glucose, UA: NEGATIVE
Ketones, UA: NEGATIVE
Leukocytes,UA: NEGATIVE
Nitrite, UA: NEGATIVE
RBC, UA: NEGATIVE
Specific Gravity, UA: 1.027 (ref 1.005–1.030)
Urobilinogen, Ur: 1 mg/dL (ref 0.2–1.0)
pH, UA: 5.5 (ref 5.0–7.5)

## 2020-02-23 LAB — COMPREHENSIVE METABOLIC PANEL
ALT: 13 IU/L (ref 0–44)
AST: 15 IU/L (ref 0–40)
Albumin/Globulin Ratio: 1.4 (ref 1.2–2.2)
Albumin: 4.8 g/dL (ref 3.8–4.9)
Alkaline Phosphatase: 95 IU/L (ref 39–117)
BUN/Creatinine Ratio: 10 (ref 9–20)
BUN: 16 mg/dL (ref 6–24)
Bilirubin Total: 0.3 mg/dL (ref 0.0–1.2)
CO2: 16 mmol/L — ABNORMAL LOW (ref 20–29)
Calcium: 9.8 mg/dL (ref 8.7–10.2)
Chloride: 109 mmol/L — ABNORMAL HIGH (ref 96–106)
Creatinine, Ser: 1.64 mg/dL — ABNORMAL HIGH (ref 0.76–1.27)
GFR calc Af Amer: 54 mL/min/{1.73_m2} — ABNORMAL LOW (ref >59)
GFR calc non Af Amer: 47 mL/min/{1.73_m2} — ABNORMAL LOW (ref >59)
Globulin, Total: 3.4 g/dL (ref 1.5–4.5)
Glucose: 96 mg/dL (ref 65–99)
Potassium: 4.3 mmol/L (ref 3.5–5.2)
Sodium: 145 mmol/L — ABNORMAL HIGH (ref 134–144)
Total Protein: 8.2 g/dL (ref 6.0–8.5)

## 2020-02-23 LAB — SPECIMEN STATUS REPORT

## 2020-02-23 LAB — CBC WITH DIFFERENTIAL/PLATELET
Basophils Absolute: 0 10*3/uL (ref 0.0–0.2)
Basos: 1 %
EOS (ABSOLUTE): 0.1 10*3/uL (ref 0.0–0.4)
Eos: 2 %
Hematocrit: 47.1 % (ref 37.5–51.0)
Hemoglobin: 14.9 g/dL (ref 13.0–17.7)
Immature Grans (Abs): 0 10*3/uL (ref 0.0–0.1)
Immature Granulocytes: 0 %
Lymphocytes Absolute: 1.7 10*3/uL (ref 0.7–3.1)
Lymphs: 27 %
MCH: 25.9 pg — ABNORMAL LOW (ref 26.6–33.0)
MCHC: 31.6 g/dL (ref 31.5–35.7)
MCV: 82 fL (ref 79–97)
Monocytes Absolute: 0.6 10*3/uL (ref 0.1–0.9)
Monocytes: 10 %
Neutrophils Absolute: 3.9 10*3/uL (ref 1.4–7.0)
Neutrophils: 60 %
Platelets: 275 10*3/uL (ref 150–450)
RBC: 5.75 x10E6/uL (ref 4.14–5.80)
RDW: 16.6 % — ABNORMAL HIGH (ref 11.6–15.4)
WBC: 6.4 10*3/uL (ref 3.4–10.8)

## 2020-02-23 LAB — VITAMIN D 25 HYDROXY (VIT D DEFICIENCY, FRACTURES): Vit D, 25-Hydroxy: 12 ng/mL — ABNORMAL LOW (ref 30.0–100.0)

## 2020-02-23 LAB — RENAL FUNCTION PANEL: Phosphorus: 3.6 mg/dL (ref 2.8–4.1)

## 2020-02-24 ENCOUNTER — Encounter: Payer: Self-pay | Admitting: Gerontology

## 2020-02-24 ENCOUNTER — Ambulatory Visit: Payer: Self-pay | Admitting: Gerontology

## 2020-02-24 ENCOUNTER — Other Ambulatory Visit: Payer: Self-pay

## 2020-02-24 VITALS — BP 150/102 | HR 78 | Temp 96.6°F | Ht 69.0 in | Wt 284.2 lb

## 2020-02-24 DIAGNOSIS — I10 Essential (primary) hypertension: Secondary | ICD-10-CM

## 2020-02-24 DIAGNOSIS — E559 Vitamin D deficiency, unspecified: Secondary | ICD-10-CM | POA: Insufficient documentation

## 2020-02-24 DIAGNOSIS — R7303 Prediabetes: Secondary | ICD-10-CM

## 2020-02-24 MED ORDER — VITAMIN D (ERGOCALCIFEROL) 1.25 MG (50000 UNIT) PO CAPS: 50000.0000 [IU] | ORAL_CAPSULE | ORAL | 0 refills | Status: DC

## 2020-02-24 NOTE — Patient Instructions (Signed)
Hypertension, Adult Hypertension is another name for high blood pressure. High blood pressure forces your heart to work harder to pump blood. This can cause problems over time. There are two numbers in a blood pressure reading. There is a top number (systolic) over a bottom number (diastolic). It is best to have a blood pressure that is below 120/80. Healthy choices can help lower your blood pressure, or you may need medicine to help lower it. What are the causes? The cause of this condition is not known. Some conditions may be related to high blood pressure. What increases the risk?  Smoking.  Having type 2 diabetes mellitus, high cholesterol, or both.  Not getting enough exercise or physical activity.  Being overweight.  Having too much fat, sugar, calories, or salt (sodium) in your diet.  Drinking too much alcohol.  Having long-term (chronic) kidney disease.  Having a family history of high blood pressure.  Age. Risk increases with age.  Race. You may be at higher risk if you are African American.  Gender. Men are at higher risk than women before age 45. After age 65, women are at higher risk than men.  Having obstructive sleep apnea.  Stress. What are the signs or symptoms?  High blood pressure may not cause symptoms. Very high blood pressure (hypertensive crisis) may cause: ? Headache. ? Feelings of worry or nervousness (anxiety). ? Shortness of breath. ? Nosebleed. ? A feeling of being sick to your stomach (nausea). ? Throwing up (vomiting). ? Changes in how you see. ? Very bad chest pain. ? Seizures. How is this treated?  This condition is treated by making healthy lifestyle changes, such as: ? Eating healthy foods. ? Exercising more. ? Drinking less alcohol.  Your health care provider may prescribe medicine if lifestyle changes are not enough to get your blood pressure under control, and if: ? Your top number is above 130. ? Your bottom number is above  80.  Your personal target blood pressure may vary. Follow these instructions at home: Eating and drinking   If told, follow the DASH eating plan. To follow this plan: ? Fill one half of your plate at each meal with fruits and vegetables. ? Fill one fourth of your plate at each meal with whole grains. Whole grains include whole-wheat pasta, brown rice, and whole-grain bread. ? Eat or drink low-fat dairy products, such as skim milk or low-fat yogurt. ? Fill one fourth of your plate at each meal with low-fat (lean) proteins. Low-fat proteins include fish, chicken without skin, eggs, beans, and tofu. ? Avoid fatty meat, cured and processed meat, or chicken with skin. ? Avoid pre-made or processed food.  Eat less than 1,500 mg of salt each day.  Do not drink alcohol if: ? Your doctor tells you not to drink. ? You are pregnant, may be pregnant, or are planning to become pregnant.  If you drink alcohol: ? Limit how much you use to:  0-1 drink a day for women.  0-2 drinks a day for men. ? Be aware of how much alcohol is in your drink. In the [place] one drink equals one 12 oz bottle of beer (355 mL), one 5 oz glass of wine (148 mL), or one 1 oz glass of hard liquor (44 mL). Lifestyle   Work with your doctor to stay at a healthy weight or to lose weight. Ask your doctor what the best weight is for you.  Get at least 30 minutes of exercise most   days of the week. This may include walking, swimming, or biking.  Get at least 30 minutes of exercise that strengthens your muscles (resistance exercise) at least 3 days a week. This may include lifting weights or doing Pilates.  Do not use any products that contain nicotine or tobacco, such as cigarettes, e-cigarettes, and chewing tobacco. If you need help quitting, ask your doctor.  Check your blood pressure at home as told by your doctor.  Keep all follow-up visits as told by your doctor. This is important. Medicines  Take over-the-counter  and prescription medicines only as told by your doctor. Follow directions carefully.  Do not skip doses of blood pressure medicine. The medicine does not work as well if you skip doses. Skipping doses also puts you at risk for problems.  Ask your doctor about side effects or reactions to medicines that you should watch for. Contact a doctor if you:  Think you are having a reaction to the medicine you are taking.  Have headaches that keep coming back (recurring).  Feel dizzy.  Have swelling in your ankles.  Have trouble with your vision. Get help right away if you:  Get a very bad headache.  Start to feel mixed up (confused).  Feel weak or numb.  Feel faint.  Have very bad pain in your: ? Chest. ? Belly (abdomen).  Throw up more than once.  Have trouble breathing. Summary  Hypertension is another name for high blood pressure.  High blood pressure forces your heart to work harder to pump blood.  For most people, a normal blood pressure is less than 120/80.  Making healthy choices can help lower blood pressure. If your blood pressure does not get lower with healthy choices, you may need to take medicine. This information is not intended to replace advice given to you by your health care provider. Make sure you discuss any questions you have with your health care provider. Document Revised: 06/11/2018 Document Reviewed: 06/11/2018 Elsevier Patient Education  2020 [employer]   Managing Your Hypertension Hypertension is commonly called high blood pressure. This is when the force of your blood pressing against the walls of your arteries is too strong. Arteries are blood vessels that carry blood from your heart throughout your body. Hypertension forces the heart to work harder to pump blood, and may cause the arteries to become narrow or stiff. Having untreated or uncontrolled hypertension can cause heart attack, stroke, kidney disease, and other problems. What are blood  pressure readings? A blood pressure reading consists of a higher number over a lower number. Ideally, your blood pressure should be below 120/80. The first ("top") number is called the systolic pressure. It is a measure of the pressure in your arteries as your heart beats. The second ("bottom") number is called the diastolic pressure. It is a measure of the pressure in your arteries as the heart relaxes. What does my blood pressure reading mean? Blood pressure is classified into four stages. Based on your blood pressure reading, your health care provider may use the following stages to determine what type of treatment you need, if any. Systolic pressure and diastolic pressure are measured in a unit called mm Hg. Normal  Systolic pressure: below 120.  Diastolic pressure: below 80. Elevated  Systolic pressure: 120-129.  Diastolic pressure: below 80. Hypertension stage 1  Systolic pressure: 130-139.  Diastolic pressure: 80-89. Hypertension stage 2  Systolic pressure: 140 or above.  Diastolic pressure: 90 or above. What health risks are associated   with hypertension? Managing your hypertension is an important responsibility. Uncontrolled hypertension can lead to:  A heart attack.  A stroke.  A weakened blood vessel (aneurysm).  Heart failure.  Kidney damage.  Eye damage.  Metabolic syndrome.  Memory and concentration problems. What changes can I make to manage my hypertension? Hypertension can be managed by making lifestyle changes and possibly by taking medicines. Your health care provider will help you make a plan to bring your blood pressure within a normal range. Eating and drinking   Eat a diet that is high in fiber and potassium, and low in salt (sodium), added sugar, and fat. An example eating plan is called the DASH (Dietary Approaches to Stop Hypertension) diet. To eat this way: ? Eat plenty of fresh fruits and vegetables. Try to fill half of your plate at each  meal with fruits and vegetables. ? Eat whole grains, such as whole wheat pasta, brown rice, or whole grain bread. Fill about one quarter of your plate with whole grains. ? Eat low-fat diary products. ? Avoid fatty cuts of meat, processed or cured meats, and poultry with skin. Fill about one quarter of your plate with lean proteins such as fish, chicken without skin, beans, eggs, and tofu. ? Avoid premade and processed foods. These tend to be higher in sodium, added sugar, and fat.  Reduce your daily sodium intake. Most people with hypertension should eat less than 1,500 mg of sodium a day.  Limit alcohol intake to no more than 1 drink a day for nonpregnant women and 2 drinks a day for men. One drink equals 12 oz of beer, 5 oz of wine, or 1 oz of hard liquor. Lifestyle  Work with your health care provider to maintain a healthy body weight, or to lose weight. Ask what an ideal weight is for you.  Get at least 30 minutes of exercise that causes your heart to beat faster (aerobic exercise) most days of the week. Activities may include walking, swimming, or biking.  Include exercise to strengthen your muscles (resistance exercise), such as weight lifting, as part of your weekly exercise routine. Try to do these types of exercises for 30 minutes at least 3 days a week.  Do not use any products that contain nicotine or tobacco, such as cigarettes and e-cigarettes. If you need help quitting, ask your health care provider.  Control any long-term (chronic) conditions you have, such as high cholesterol or diabetes. Monitoring  Monitor your blood pressure at home as told by your health care provider. Your personal target blood pressure may vary depending on your medical conditions, your age, and other factors.  Have your blood pressure checked regularly, as often as told by your health care provider. Working with your health care provider  Review all the medicines you take with your health care  provider because there may be side effects or interactions.  Talk with your health care provider about your diet, exercise habits, and other lifestyle factors that may be contributing to hypertension.  Visit your health care provider regularly. Your health care provider can help you create and adjust your plan for managing hypertension. Will I need medicine to control my blood pressure? Your health care provider may prescribe medicine if lifestyle changes are not enough to get your blood pressure under control, and if:  Your systolic blood pressure is 130 or higher.  Your diastolic blood pressure is 80 or higher. Take medicines only as told by your health care provider. Follow   directions carefully. Blood pressure medicines must be taken as prescribed. The medicine does not work as well when you skip doses. Skipping doses also puts you at risk for problems. Contact a health care provider if:  You think you are having a reaction to medicines you have taken.  You have repeated (recurrent) headaches.  You feel dizzy.  You have swelling in your ankles.  You have trouble with your vision. Get help right away if:  You develop a severe headache or confusion.  You have unusual weakness or numbness, or you feel faint.  You have severe pain in your chest or abdomen.  You vomit repeatedly.  You have trouble breathing. Summary  Hypertension is when the force of blood pumping through your arteries is too strong. If this condition is not controlled, it may put you at risk for serious complications.  Your personal target blood pressure may vary depending on your medical conditions, your age, and other factors. For most people, a normal blood pressure is less than 120/80.  Hypertension is managed by lifestyle changes, medicines, or both. Lifestyle changes include weight loss, eating a healthy, low-sodium diet, exercising more, and limiting alcohol. This information is not intended to  replace advice given to you by your health care provider. Make sure you discuss any questions you have with your health care provider. Document Revised: 01/23/2019 Document Reviewed: 08/29/2016 Elsevier Patient Education  [place].  Preventing Type 2 Diabetes Mellitus Type 2 diabetes (type 2 diabetes mellitus) is a long-term (chronic) disease that affects blood sugar (glucose) levels. Normally, a hormone called insulin allows glucose to enter cells in the body. The cells use glucose for energy. In type 2 diabetes, one or both of these problems may be present:  The body does not make enough insulin.  The body does not respond properly to insulin that it makes (insulin resistance). Insulin resistance or lack of insulin causes excess glucose to build up in the blood instead of going into cells. As a result, high blood glucose (hyperglycemia) develops, which can cause many complications. Being overweight or obese and having an inactive (sedentary) lifestyle can increase your risk for diabetes. Type 2 diabetes can be delayed or prevented by making certain nutrition and lifestyle changes. What nutrition changes can be made?   Eat healthy meals and snacks regularly. Keep a healthy snack with you for when you get hungry between meals, such as fruit or a handful of nuts.  Eat lean meats and proteins that are low in saturated fats, such as chicken, fish, egg whites, and beans. Avoid processed meats.  Eat plenty of fruits and vegetables and plenty of grains that have not been processed (whole grains). It is recommended that you eat: ? 1?2 cups of fruit every day. ? 2?3 cups of vegetables every day. ? 6?8 oz of whole grains every day, such as oats, whole wheat, bulgur, brown rice, quinoa, and millet.  Eat low-fat dairy products, such as milk, yogurt, and cheese.  Eat foods that contain healthy fats, such as nuts, avocado, olive oil, and canola oil.  Drink water throughout the day. Avoid  drinks that contain added sugar, such as soda or sweet tea.  Follow instructions from your health care provider about specific eating or drinking restrictions.  Control how much food you eat at a time (portion size). ? Check food labels to find out the serving sizes of foods. ? Use a kitchen scale to weigh amounts of foods.  Saute or steam food  instead of frying it. Cook with water or broth instead of oils or butter.  Limit your intake of: ? Salt (sodium). Have no more than 1 tsp (2,400 mg) of sodium a day. If you have heart disease or high blood pressure, have less than ? tsp (1,500 mg) of sodium a day. ? Saturated fat. This is fat that is solid at room temperature, such as butter or fat on meat. What lifestyle changes can be made? Activity   Do moderate-intensity physical activity for at least 30 minutes on at least 5 days of the week, or as much as told by your health care provider.  Ask your health care provider what activities are safe for you. A mix of physical activities may be best, such as walking, swimming, cycling, and strength training.  Try to add physical activity into your day. For example: ? Park in spots that are farther away than usual, so that you walk more. For example, park in a far corner of the parking lot when you go to the office or the grocery store. ? Take a walk during your lunch break. ? Use stairs instead of elevators or escalators. Weight Loss  Lose weight as directed. Your health care provider can determine how much weight loss is best for you and can help you lose weight safely.  If you are overweight or obese, you may be instructed to lose at least 5?7 % of your body weight. Alcohol and Tobacco   Limit alcohol intake to no more than 1 drink a day for nonpregnant women and 2 drinks a day for men. One drink equals 12 oz of beer, 5 oz of wine, or 1 oz of hard liquor.  Do not use any tobacco products, such as cigarettes, chewing tobacco, and  e-cigarettes. If you need help quitting, ask your health care provider. Work With [facility] Provider  Have your blood glucose tested regularly, as told by your health care provider.  Discuss your risk factors and how you can reduce your risk for diabetes.  Get screening tests as told by your health care provider. You may have screening tests regularly, especially if you have certain risk factors for type 2 diabetes.  Make an appointment with a diet and nutrition specialist (registered dietitian). A registered dietitian can help you make a healthy eating plan and can help you understand portion sizes and food labels. Why are these changes important?  It is possible to prevent or delay type 2 diabetes and related health problems by making lifestyle and nutrition changes.  It can be difficult to recognize signs of type 2 diabetes. The best way to avoid possible damage to your body is to take actions to prevent the disease before you develop symptoms. What can happen if changes are not made?  Your blood glucose levels may keep increasing. Having high blood glucose for a long time is dangerous. Too much glucose in your blood can damage your blood vessels, heart, kidneys, nerves, and eyes.  You may develop prediabetes or type 2 diabetes. Type 2 diabetes can lead to many chronic health problems and complications, such as: ? Heart disease. ? Stroke. ? Blindness. ? Kidney disease. ? Depression. ? Poor circulation in the feet and legs, which could lead to surgical removal (amputation) in severe cases. Where to find support  Ask your health care provider to recommend a registered dietitian, diabetes educator, or weight loss program.  Look for local or online weight loss groups.  Join a  gym, fitness club, or outdoor activity group, such as a walking club. Where to find more information To learn more about diabetes and diabetes prevention, visit:  American Diabetes Association (ADA):  www.diabetes.[employer] of Diabetes and Digestive and Kidney Diseases: FindSpin.nl To learn more about healthy eating, visit:  The [place]S. Department of Agriculture Scientist, research (physical sciences)), Choose My Plate: http://wiley-williams.com/  Office of Disease Prevention and Health Promotion (ODPHP), Dietary Guidelines: SurferLive.at Summary  You can reduce your risk for type 2 diabetes by increasing your physical activity, eating healthy foods, and losing weight as directed.  Talk with your health care provider about your risk for type 2 diabetes. Ask about any blood tests or screening tests that you need to have. This information is not intended to replace advice given to you by your health care provider. Make sure you discuss any questions you have with your health care provider. Document Revised: 01/23/2019 Document Reviewed: 11/22/2015 Elsevier Patient Education  [place].

## 2020-02-24 NOTE — Progress Notes (Signed)
Established Patient Office Visit  Subjective:  Patient ID: Anthony Lam, male    DOB: 16-Oct-1965  Age: 54 y.o. MRN: [id number]  CC:  Chief Complaint  Patient presents with  . Follow-up    HPI Anthony Lam presents for follow up of hypertension, Prediabetes and lab review. He reports that he's compliant with his medication, and continues to work on making healthy lifestyle changes, but he has not taking his 50 mg Losartan prior to today's visit. He checks his blood pressure at home and he states that it runs in the low to mid 140's/ low to mid 90's. He was seen by the [facility] Nephrologist Dr Ardyth Man on 02/22/2020 and he was advised to check his blood pressure three times, record and call them in 1 week. His HgbA1c done on 11/25/2019 was 5.9% and he declined Metformin therapy, but Dr Ardyth Man suggested the initiation of Metformin for prediabetes. His lab done on 02/17/2020, vitamin D was 12 ng/ml and he is c/o fatigue. His creatinine decreased from 1.82 mg/dl to 1.64 mg/dl, eGFR increased from 41 to 47 and from 48 to 54. His urine was turbid with +1 protein.  He had Colonoscopy done on 01/26/2020 by Dr Bailey Mech A and it showed Diverticulosis in the left colon.  Two 3 to 4 mm polyps in the ascending colon, removed with a cold biopsy forceps. Resected and retrieved. Two 4 to 5 mm polyps in the ascending colon, removed with a cold snare. Resected and retrieved. - The examination was otherwise normal on direct and retroflexion views and he will have repeat Colonoscopy in 3 years. Overall, he states that he's doing well and offers no further complaint.  Past Medical History:  Diagnosis Date  . Hypertension   . Migraines   . Seizures ([place])   . Sleep apnea   . TB (tuberculosis), treated 2009    Past Surgical History:  Procedure Laterality Date  . APPENDECTOMY    . COLONOSCOPY WITH PROPOFOL N/A 01/26/2020   Procedure: COLONOSCOPY WITH PROPOFOL;  Surgeon: Jonathon Bellows, MD;  Location: [facility] ENDOSCOPY;   Service: Gastroenterology;  Laterality: N/A;    Family History  Problem Relation Age of Onset  . Hypertension Mother   . Hypertension Father     Social History   Socioeconomic History  . Marital status: Single    Spouse name: Not on file  . Number of children: 2  . Years of education: Not on file  . Highest education level: 12th grade  Occupational History  . Not on file  Tobacco Use  . Smoking status: Light Tobacco Smoker    Packs/day: 0.25    Years: 15.00    Pack years: 3.75  . Smokeless tobacco: Never Used  Substance and Sexual Activity  . Alcohol use: Not Currently    Comment: Not anymore  . Drug use: Never  . Sexual activity: Yes  Other Topics Concern  . Not on file  Social History Narrative  . Not on file   Social Determinants of Health   Financial Resource Strain:   . Difficulty of Paying Living Expenses:   Food Insecurity:   . Worried About Charity fundraiser in the Last Year:   . Arboriculturist in the Last Year:   Transportation Needs:   . Film/video editor (Medical):   Marland Kitchen Lack of Transportation (Non-Medical):   Physical Activity:   . Days of Exercise per Week:   . Minutes of Exercise per Session:  Stress:   . Feeling of Stress :   Social Connections:   . Frequency of Communication with Friends and Family:   . Frequency of Social Gatherings with Friends and Family:   . Attends Religious Services:   . Active Member of Clubs or Organizations:   . Attends Archivist Meetings:   Marland Kitchen Marital Status:   Intimate Partner Violence:   . Fear of Current or Ex-Partner:   . Emotionally Abused:   Marland Kitchen Physically Abused:   . Sexually Abused:     Outpatient Medications Prior to Visit  Medication Sig Dispense Refill  . amLODipine (NORVASC) 10 MG tablet Take 1 tablet (10 mg total) by mouth daily. 90 tablet 1  . cholecalciferol (VITAMIN D3) 25 MCG (1000 UNIT) tablet Take 1,000 Units by mouth daily.    Marland Kitchen gabapentin (NEURONTIN) 100 MG capsule Take  100 mg by mouth at bedtime.     Marland Kitchen losartan (COZAAR) 50 MG tablet Take 1 tablet (50 mg total) by mouth daily. Pls provide 90 day supply 90 tablet 1   No facility-administered medications prior to visit.    No Known Allergies  ROS Review of Systems  Constitutional: Negative.  Negative for fatigue.  Eyes: Negative.   Respiratory: Negative.   Cardiovascular: Negative.   Endocrine: Negative.   Musculoskeletal: Negative for back pain.  Neurological: Negative.   Psychiatric/Behavioral: Negative.       Objective:    Physical Exam  Constitutional: He is oriented to person, place, and time. He appears well-developed.  HENT:  Head: Normocephalic and atraumatic.  Eyes: Pupils are equal, round, and reactive to light. EOM are normal.  Cardiovascular: Normal rate and regular rhythm.  Pulmonary/Chest: Effort normal and breath sounds normal.  Musculoskeletal:        General: No edema.  Neurological: He is alert and oriented to person, place, and time.  Psychiatric: He has a normal mood and affect. His behavior is normal. Judgment and thought content normal.    BP (!) 150/102 (BP Location: Left Arm, Patient Position: Sitting, Cuff Size: Large)   Pulse 78   Temp (!) 96.6 F (35.9 C)   Ht '5\' 9"'  (1.753 m)   Wt 284 lb 3.2 oz (128.9 kg)   SpO2 97%   BMI 41.97 kg/m  Wt Readings from Last 3 Encounters:  02/24/20 284 lb 3.2 oz (128.9 kg)  02/17/20 279 lb 11.2 oz (126.9 kg)  01/26/20 278 lb (126.1 kg)   He was encouraged to continue on a weight loss regimen.  Health Maintenance Due  Topic Date Due  . HIV Screening  Never done  . COVID-19 Vaccine (1) Never done  . TETANUS/TDAP  Never done    There are no preventive care reminders to display for this patient.  Lab Results  Component Value Date   TSH 1.860 07/10/2018   Lab Results  Component Value Date   WBC WILL FOLLOW 02/17/2020   WBC 6.4 02/17/2020   HGB WILL FOLLOW 02/17/2020   HGB 14.9 02/17/2020   HCT WILL FOLLOW  02/17/2020   HCT 47.1 02/17/2020   MCV WILL FOLLOW 02/17/2020   MCV 82 02/17/2020   PLT WILL FOLLOW 02/17/2020   PLT 275 02/17/2020   Lab Results  Component Value Date   NA 145 (H) 02/17/2020   K 4.3 02/17/2020   CO2 16 (L) 02/17/2020   GLUCOSE 96 02/17/2020   BUN 16 02/17/2020   CREATININE 1.64 (H) 02/17/2020   BILITOT 0.3 02/17/2020   ALKPHOS  95 02/17/2020   AST 15 02/17/2020   ALT 13 02/17/2020   PROT 8.2 02/17/2020   ALBUMIN 4.8 02/17/2020   CALCIUM 9.8 02/17/2020   ANIONGAP 6 (L) 08/04/2013   Lab Results  Component Value Date   CHOL 162 06/03/2019   Lab Results  Component Value Date   HDL 44 06/03/2019   Lab Results  Component Value Date   LDLCALC 97 06/03/2019   Lab Results  Component Value Date   TRIG 104 06/03/2019   Lab Results  Component Value Date   CHOLHDL 3.7 06/03/2019   Lab Results  Component Value Date   HGBA1C 5.9 (H) 11/25/2019      Assessment & Plan:   1. Prediabetes - His HgbA1c done on 11/25/2019 was 5.9%, rechecked it today and he has agreed to start Metformin therapy. He was advised to continue on low carb/non concentrated sweet diet and exercise as tolerated. - HgB A1c; Future - HgB A1c  2. Vitamin D deficiency - He will start ergocalciferol 50,000 units weekly, advised to get adequate sunlight. - Vitamin D, Ergocalciferol, (DRISDOL) 1.25 MG (50000 UNIT) CAPS capsule; Take 1 capsule (50,000 Units total) by mouth every 7 (seven) days.  Dispense: 16 capsule; Refill: 0  3. Essential hypertension - Uncontrolled blood pressure, he was advised to take his 50 mg Losartan as soon as he gets home. Check his blood pressure three times a week, record and notify Nephrologist Dr Ardyth Man next week, and bring log to follow up appointment. He was advised to continue on DASH diet, and exercise as tolerated. He was also advised to increase water intake.     Follow-up: Return in about 3 weeks (around 03/16/2020), or if symptoms worsen or fail to  improve.    Kelley Polinsky Jerold Coombe, NP

## 2020-02-25 ENCOUNTER — Other Ambulatory Visit: Payer: Self-pay | Admitting: Gerontology

## 2020-02-25 DIAGNOSIS — R7303 Prediabetes: Secondary | ICD-10-CM

## 2020-02-25 LAB — HEMOGLOBIN A1C
Est. average glucose Bld gHb Est-mCnc: 123 mg/dL
Hgb A1c MFr Bld: 5.9 % — ABNORMAL HIGH (ref 4.8–5.6)

## 2020-02-25 MED ORDER — METFORMIN HCL 1000 MG PO TABS: 500.0000 mg | ORAL_TABLET | Freq: Every day | ORAL | 0 refills | Status: DC

## 2020-03-10 ENCOUNTER — Ambulatory Visit: Payer: Self-pay

## 2020-03-10 DIAGNOSIS — Z79899 Other long term (current) drug therapy: Secondary | ICD-10-CM

## 2020-03-10 NOTE — Progress Notes (Cosign Needed)
Medication Management Clinic Visit Note  Patient: Anthony Lam MRN: [id number] Date of Birth: 02/22/1966 PCP: Langston Reusing, NP   Dahlia Byes 54 y.o. male presents for a MTM annual visit today via telephone. Pt was identified by name and DOB.  There were no vitals taken for this visit.  Patient Information   Past Medical History:  Diagnosis Date  . Hypertension   . Migraines   . Seizures ([place])   . Sleep apnea   . TB (tuberculosis), treated 2009      Past Surgical History:  Procedure Laterality Date  . APPENDECTOMY    . COLONOSCOPY WITH PROPOFOL N/A 01/26/2020   Procedure: COLONOSCOPY WITH PROPOFOL;  Surgeon: Jonathon Bellows, MD;  Location: [facility] ENDOSCOPY;  Service: Gastroenterology;  Laterality: N/A;     Family History  Problem Relation Age of Onset  . Hypertension Mother   . Hypertension Father    Health Maintenance/Date Completed  Last Visit to PCP: Feb 25 2020 Next Visit to PCP: April 21 2020 Specialist Visit: [facility] Neurology June 09 2020 Colonoscopy: March 2021 Flu Vaccine: Yes COVID-19 Vaccine: Yes  New Diagnoses (since last visit):   Family Support: Good  Lifestyle Always trying to do something around the house. Will try to walk 3x a week 10-15 mins  Diet: Breakfast:waffles, eggs Lunch: pizza, grilled cheese Dinner:Chicken Drinks:Water, sometimes soda           Social History   Substance and Sexual Activity  Alcohol Use Not Currently   Comment: Not anymore      Social History   Tobacco Use  Smoking Status Light Tobacco Smoker  . Packs/day: 0.25  . Years: 15.00  . Pack years: 3.75  Smokeless Tobacco Never Used      Health Maintenance  Topic Date Due  . COVID-19 Vaccine (1) Never done  . HIV Screening  Never done  . TETANUS/TDAP  Never done  . INFLUENZA VACCINE  05/15/2020  . COLONOSCOPY  01/26/2023     Assessment and Plan:  Adherence/Compliance: Currently has no issues taking his medications, and takes them on  time. Pt states his is sometimes depressed due to issues out of his control. He is trying to get social security, and disability.   HTN: Pt is currently on losartan and amlodipine. Previously he would take them together but when he did he experienced dizziness so his doctor told him to separate the pills and that has been working well for him.  He does measure his BP regularly at home 3x a week last reading on 5/25 was 137/98. In clinic it was 150/103 (02/25/2020). Pt feels stressed often and is sometimes frustrated that his BP is not going lower despite taking medications. Pt does not regularly exercise at the moment due to pain in his lower back and legs. He states he does a lot of work around the house but stops intermittently because of knee pain, and back  pain. Recommended to the pt starting a small goal of walking 3x a week 10-15 mins could be helpful to him in managing pain and stress. Told pt that a scheduled leisurely walk could relieve some of his stress and help his BP and arthritic pain. Pt enjoys reading and agreed to start walking to [employer]. Discussed DASH diet with the patient and told pt some recommendations and if he ever forgets he can look up DASH diet in order to maintain his sodium intake to help his BP.   Prediabetes: Pt's A1c in Feb  2021 was 5.9% he doesn't check sugars at home because he doesn't have a monitor. Recently he got prescribed metformin which he started taking ~1.5 weeks ago. Pt notes that he gets GI upset 5-6 hours after taking his metformin. Advised the pt that this is a common AE of metformin that should go away ~2 weeks if not a little longer. Told the pt that taking it with food may help with the GI upset.   Congestion: Pt noted that he has almost intolerable congestion, he is currently on [facility] but feels like its not working. Pt is unsure if it is allergies or not. Pt denies sneezing, itchy eyes, runny nose though does acknowledge his congestion acts up with  pollen. Recommended the pt to potentially try Zyrtec or speak to his doctor about this in possibly trying NASONEX as an alternative to see if that would help. He states he will mention this on his next visit.   Pain: Pt is currently on gabapentin. He states that he has lower back, and knee pain that is unbearable with a score 10/10. States that he only takes the gabapentin at night because of the sedative effects. He states with the gabapentin oftentimes his pain is at 7-8/10 and is consistent throughout the day. Doctor told him to possibly try tylenol and to stay away from NSAIDs due to kidney impairment.   Estimated call time: ~1 hour  RTC Follow-Up: 12 Months  Willow Ora (Ty) Prowers PharmD Candidate [employer] [facility]

## 2020-03-11 ENCOUNTER — Other Ambulatory Visit: Payer: Self-pay

## 2020-03-17 ENCOUNTER — Encounter: Payer: Self-pay | Admitting: Gerontology

## 2020-03-17 ENCOUNTER — Other Ambulatory Visit: Payer: Self-pay

## 2020-03-17 ENCOUNTER — Ambulatory Visit: Payer: Self-pay | Admitting: Gerontology

## 2020-03-17 VITALS — BP 140/87 | HR 81 | Ht 69.0 in | Wt 287.0 lb

## 2020-03-17 DIAGNOSIS — R0981 Nasal congestion: Secondary | ICD-10-CM

## 2020-03-17 DIAGNOSIS — R7303 Prediabetes: Secondary | ICD-10-CM

## 2020-03-17 DIAGNOSIS — I10 Essential (primary) hypertension: Secondary | ICD-10-CM

## 2020-03-17 MED ORDER — METFORMIN HCL 1000 MG PO TABS: 500.0000 mg | ORAL_TABLET | Freq: Every day | ORAL | 2 refills | Status: DC

## 2020-03-17 MED ORDER — CETIRIZINE HCL 10 MG PO TABS: 10.0000 mg | ORAL_TABLET | Freq: Every day | ORAL | 1 refills | Status: DC

## 2020-03-17 MED ORDER — LOSARTAN POTASSIUM 50 MG PO TABS: 50.0000 mg | ORAL_TABLET | Freq: Every day | ORAL | 1 refills | Status: DC

## 2020-03-17 MED ORDER — AMLODIPINE BESYLATE 10 MG PO TABS: 10.0000 mg | ORAL_TABLET | Freq: Every day | ORAL | 1 refills | Status: DC

## 2020-03-17 NOTE — Progress Notes (Signed)
Established Patient Office Visit  Subjective:  Patient ID: Anthony Lam, male    DOB: 03/23/1966  Age: 54 y.o. MRN: [id number]  CC: No chief complaint on file.   HPI Anthony Lam  54 y.o. male who presented to the clinic today for follow up for hypertension,and prediabetes. He denies fever, cough, chest tightness or dizziness. He complained of chronic lower back pain and is being followed by [facility] orthopedic. He was seen by nephrologist 02/22/2020. He reports being compliant with treatment regimen and continues to check his his blood pressure daily. His HgbA1c completed 02/24/2020 was 5.9%. He was started on Metformin and has been tolerating it well. Overall, he states that he is doing well and offers no further complaint.    Past Medical History:  Diagnosis Date   Hypertension    Migraines    Seizures ([place])    Sleep apnea    TB (tuberculosis), treated 2009    Past Surgical History:  Procedure Laterality Date   APPENDECTOMY     COLONOSCOPY WITH PROPOFOL N/A 01/26/2020   Procedure: COLONOSCOPY WITH PROPOFOL;  Surgeon: Jonathon Bellows, MD;  Location: [facility] ENDOSCOPY;  Service: Gastroenterology;  Laterality: N/A;    Family History  Problem Relation Age of Onset   Hypertension Mother    Hypertension Father     Social History   Socioeconomic History   Marital status: Single    Spouse name: Not on file   Number of children: 2   Years of education: Not on file   Highest education level: 12th grade  Occupational History   Not on file  Tobacco Use   Smoking status: Light Tobacco Smoker    Packs/day: 0.25    Years: 15.00    Pack years: 3.75   Smokeless tobacco: Never Used   Tobacco comment: The only time he smokes is when he has a beer 1 cigarette  Substance and Sexual Activity   Alcohol use: Yes    Alcohol/week: 1.0 standard drinks    Types: 1 Cans of beer per week    Comment: 1-2 beers a week   Drug use: Never   Sexual activity: Yes  Other Topics  Concern   Not on file  Social History Narrative   Not on file   Social Determinants of Health   Financial Resource Strain:    Difficulty of Paying Living Expenses:   Food Insecurity:    Worried About Charity fundraiser in the Last Year:    Arboriculturist in the Last Year:   Transportation Needs:    Film/video editor (Medical):    Lack of Transportation (Non-Medical):   Physical Activity:    Days of Exercise per Week:    Minutes of Exercise per Session:   Stress:    Feeling of Stress :   Social Connections:    Frequency of Communication with Friends and Family:    Frequency of Social Gatherings with Friends and Family:    Attends Religious Services:    Active Member of Clubs or Organizations:    Attends Music therapist:    Marital Status:   Intimate Partner Violence:    Fear of Current or Ex-Partner:    Emotionally Abused:    Physically Abused:    Sexually Abused:     Outpatient Medications Prior to Visit  Medication Sig Dispense Refill   amLODipine (NORVASC) 10 MG tablet Take 1 tablet (10 mg total) by mouth daily. 90 tablet 1  cholecalciferol (VITAMIN D3) 25 MCG (1000 UNIT) tablet Take 1,000 Units by mouth daily.     fluticasone (FLONASE) 50 MCG/ACT nasal spray Place 1 spray into both nostrils daily.     gabapentin (NEURONTIN) 100 MG capsule Take 100 mg by mouth at bedtime.      losartan (COZAAR) 50 MG tablet Take 1 tablet (50 mg total) by mouth daily. Pls provide 90 day supply 90 tablet 1   metFORMIN (GLUCOPHAGE) 1000 MG tablet Take 0.5 tablets (500 mg total) by mouth daily with breakfast. 30 tablet 0   Vitamin D, Ergocalciferol, (DRISDOL) 1.25 MG (50000 UNIT) CAPS capsule Take 1 capsule (50,000 Units total) by mouth every 7 (seven) days. 16 capsule 0   No facility-administered medications prior to visit.    No Known Allergies  ROS Review of Systems  Constitutional: Negative.   Eyes: Negative.   Respiratory:  Negative.   Cardiovascular: Negative.   Gastrointestinal: Negative.   Genitourinary: Negative.   Skin: Negative.   Neurological: Negative.   Psychiatric/Behavioral: Negative.       Objective:    Physical Exam  Constitutional: He is oriented to person, place, and time. He appears well-developed and well-nourished.  HENT:  Head: Normocephalic and atraumatic.  Eyes: Pupils are equal, round, and reactive to light.  Cardiovascular: Normal rate, regular rhythm, normal heart sounds and intact distal pulses.  Pulmonary/Chest: Effort normal and breath sounds normal.  Abdominal: Soft. Bowel sounds are normal.  Musculoskeletal:        General: Normal range of motion.  Neurological: He is alert and oriented to person, place, and time.  Skin: Skin is warm and dry.  Psychiatric: He has a normal mood and affect. His behavior is normal. Judgment and thought content normal.    There were no vitals taken for this visit. Wt Readings from Last 3 Encounters:  02/24/20 284 lb 3.2 oz (128.9 kg)  02/17/20 279 lb 11.2 oz (126.9 kg)  01/26/20 278 lb (126.1 kg)   Was educated and encouraged to continue weight loss regimen in addition to decreasing his intake of concentrated sweets and sugar.   Health Maintenance Due  Topic Date Due   COVID-19 Vaccine (1) Never done   HIV Screening  Never done   TETANUS/TDAP  Never done    There are no preventive care reminders to display for this patient.  Lab Results  Component Value Date   TSH 1.860 07/10/2018   Lab Results  Component Value Date   WBC WILL FOLLOW 02/17/2020   WBC 6.4 02/17/2020   HGB WILL FOLLOW 02/17/2020   HGB 14.9 02/17/2020   HCT WILL FOLLOW 02/17/2020   HCT 47.1 02/17/2020   MCV WILL FOLLOW 02/17/2020   MCV 82 02/17/2020   PLT WILL FOLLOW 02/17/2020   PLT 275 02/17/2020   Lab Results  Component Value Date   NA 145 (H) 02/17/2020   K 4.3 02/17/2020   CO2 16 (L) 02/17/2020   GLUCOSE 96 02/17/2020   BUN 16 02/17/2020    CREATININE 1.64 (H) 02/17/2020   BILITOT 0.3 02/17/2020   ALKPHOS 95 02/17/2020   AST 15 02/17/2020   ALT 13 02/17/2020   PROT 8.2 02/17/2020   ALBUMIN 4.8 02/17/2020   CALCIUM 9.8 02/17/2020   ANIONGAP 6 (L) 08/04/2013   Lab Results  Component Value Date   CHOL 162 06/03/2019   Lab Results  Component Value Date   HDL 44 06/03/2019   Lab Results  Component Value Date   LDLCALC 97  06/03/2019   Lab Results  Component Value Date   TRIG 104 06/03/2019   Lab Results  Component Value Date   CHOLHDL 3.7 06/03/2019   Lab Results  Component Value Date   HGBA1C 5.9 (H) 02/24/2020      Assessment & Plan:   1. Essential hypertension He was educated and encouraged to monitor and document blood pressure twice daily. He would continue on DASH diet. - amLODipine (NORVASC) 10 MG tablet; Take 1 tablet (10 mg total) by mouth daily.  Dispense: 90 tablet; Refill: 1 - losartan (COZAAR) 50 MG tablet; Take 1 tablet (50 mg total) by mouth daily. Pls provide 90 day supply  Dispense: 90 tablet; Refill: 1 - Comp Met (CMET); Future  2. Prediabetes Decrease intake of concentrated sweets and refine sugar.  Increase exercise as tolerated. - metFORMIN (GLUCOPHAGE) 1000 MG tablet; Take 0.5 tablets (500 mg total) by mouth daily with breakfast.  Dispense: 30 tablet; Refill: 2 - HgB A1c; Future  3. Chronic nasal congestion Take medication as prescribed and notify provider for adverse effects. - cetirizine (ZYRTEC ALLERGY) 10 MG tablet; Take 1 tablet (10 mg total) by mouth daily.  Dispense: 30 tablet; Refill: 1  Follow-up:  F/u 08/31/20121 in the clinic or notify provider if symptoms worsens.  Carney Corners, RN

## 2020-03-17 NOTE — Patient Instructions (Addendum)
DASH Eating Plan DASH stands for "Dietary Approaches to Stop Hypertension." The DASH eating plan is a healthy eating plan that has been shown to reduce high blood pressure (hypertension). It may also reduce your risk for type 2 diabetes, heart disease, and stroke. The DASH eating plan may also help with weight loss. What are tips for following this plan?  General guidelines  Avoid eating more than 2,300 mg (milligrams) of salt (sodium) a day. If you have hypertension, you may need to reduce your sodium intake to 1,500 mg a day.  Limit alcohol intake to no more than 1 drink a day for nonpregnant women and 2 drinks a day for men. One drink equals 12 oz of beer, 5 oz of wine, or 1 oz of hard liquor.  Work with your health care provider to maintain a healthy body weight or to lose weight. Ask what an ideal weight is for you.  Get at least 30 minutes of exercise that causes your heart to beat faster (aerobic exercise) most days of the week. Activities may include walking, swimming, or biking.  Work with your health care provider or diet and nutrition specialist (dietitian) to adjust your eating plan to your individual calorie needs. Reading food labels   Check food labels for the amount of sodium per serving. Choose foods with less than 5 percent of the Daily Value of sodium. Generally, foods with less than 300 mg of sodium per serving fit into this eating plan.  To find whole grains, look for the word "whole" as the first word in the ingredient list. Shopping  Buy products labeled as "low-sodium" or "no salt added."  Buy fresh foods. Avoid canned foods and premade or frozen meals. Cooking  Avoid adding salt when cooking. Use salt-free seasonings or herbs instead of table salt or sea salt. Check with your health care provider or pharmacist before using salt substitutes.  Do not fry foods. Cook foods using healthy methods such as baking, boiling, grilling, and broiling instead.  Cook with  heart-healthy oils, such as olive, canola, soybean, or sunflower oil. Meal planning  Eat a balanced diet that includes: ? 5 or more servings of fruits and vegetables each day. At each meal, try to fill half of your plate with fruits and vegetables. ? Up to 6-8 servings of whole grains each day. ? Less than 6 oz of lean meat, poultry, or fish each day. A 3-oz serving of meat is about the same size as a deck of cards. One egg equals 1 oz. ? 2 servings of low-fat dairy each day. ? A serving of nuts, seeds, or beans 5 times each week. ? Heart-healthy fats. Healthy fats called Omega-3 fatty acids are found in foods such as flaxseeds and coldwater fish, like sardines, salmon, and mackerel.  Limit how much you eat of the following: ? Canned or prepackaged foods. ? Food that is high in trans fat, such as fried foods. ? Food that is high in saturated fat, such as fatty meat. ? Sweets, desserts, sugary drinks, and other foods with added sugar. ? Full-fat dairy products.  Do not salt foods before eating.  Try to eat at least 2 vegetarian meals each week.  Eat more home-cooked food and less restaurant, buffet, and fast food.  When eating at a restaurant, ask that your food be prepared with less salt or no salt, if possible. What foods are recommended? The items listed may not be a complete list. Talk with your dietitian about   what dietary choices are best for you. Grains Whole-grain or whole-wheat bread. Whole-grain or whole-wheat pasta. Brown rice. Oatmeal. Quinoa. Bulgur. Whole-grain and low-sodium cereals. Pita bread. Low-fat, low-sodium crackers. Whole-wheat flour tortillas. Vegetables Fresh or frozen vegetables (raw, steamed, roasted, or grilled). Low-sodium or reduced-sodium tomato and vegetable juice. Low-sodium or reduced-sodium tomato sauce and tomato paste. Low-sodium or reduced-sodium canned vegetables. Fruits All fresh, dried, or frozen fruit. Canned fruit in natural juice (without  added sugar). Meat and other protein foods Skinless chicken or turkey. Ground chicken or turkey. Pork with fat trimmed off. Fish and seafood. Egg whites. Dried beans, peas, or lentils. Unsalted nuts, nut butters, and seeds. Unsalted canned beans. Lean cuts of beef with fat trimmed off. Low-sodium, lean deli meat. Dairy Low-fat (1%) or fat-free (skim) milk. Fat-free, low-fat, or reduced-fat cheeses. Nonfat, low-sodium ricotta or cottage cheese. Low-fat or nonfat yogurt. Low-fat, low-sodium cheese. Fats and oils Soft margarine without trans fats. Vegetable oil. Low-fat, reduced-fat, or light mayonnaise and salad dressings (reduced-sodium). Canola, safflower, olive, soybean, and sunflower oils. Avocado. Seasoning and other foods Herbs. Spices. Seasoning mixes without salt. Unsalted popcorn and pretzels. Fat-free sweets. What foods are not recommended? The items listed may not be a complete list. Talk with your dietitian about what dietary choices are best for you. Grains Baked goods made with fat, such as croissants, muffins, or some breads. Dry pasta or rice meal packs. Vegetables Creamed or fried vegetables. Vegetables in a cheese sauce. Regular canned vegetables (not low-sodium or reduced-sodium). Regular canned tomato sauce and paste (not low-sodium or reduced-sodium). Regular tomato and vegetable juice (not low-sodium or reduced-sodium). Pickles. Olives. Fruits Canned fruit in a light or heavy syrup. Fried fruit. Fruit in cream or butter sauce. Meat and other protein foods Fatty cuts of meat. Ribs. Fried meat. Bacon. Sausage. Bologna and other processed lunch meats. Salami. Fatback. Hotdogs. Bratwurst. Salted nuts and seeds. Canned beans with added salt. Canned or smoked fish. Whole eggs or egg yolks. Chicken or turkey with skin. Dairy Whole or 2% milk, cream, and half-and-half. Whole or full-fat cream cheese. Whole-fat or sweetened yogurt. Full-fat cheese. Nondairy creamers. Whipped toppings.  Processed cheese and cheese spreads. Fats and oils Butter. Stick margarine. Lard. Shortening. Ghee. Bacon fat. Tropical oils, such as coconut, palm kernel, or palm oil. Seasoning and other foods Salted popcorn and pretzels. Onion salt, garlic salt, seasoned salt, table salt, and sea salt. Worcestershire sauce. Tartar sauce. Barbecue sauce. Teriyaki sauce. Soy sauce, including reduced-sodium. Steak sauce. Canned and packaged gravies. Fish sauce. Oyster sauce. Cocktail sauce. Horseradish that you find on the shelf. Ketchup. Mustard. Meat flavorings and tenderizers. Bouillon cubes. Hot sauce and Tabasco sauce. Premade or packaged marinades. Premade or packaged taco seasonings. Relishes. Regular salad dressings. Where to find more information:  National Heart, Lung, and Blood Institute: www.nhlbi.nih.gov  American Heart Association: www.heart.org Summary  The DASH eating plan is a healthy eating plan that has been shown to reduce high blood pressure (hypertension). It may also reduce your risk for type 2 diabetes, heart disease, and stroke.  With the DASH eating plan, you should limit salt (sodium) intake to 2,300 mg a day. If you have hypertension, you may need to reduce your sodium intake to 1,500 mg a day.  When on the DASH eating plan, aim to eat more fresh fruits and vegetables, whole grains, lean proteins, low-fat dairy, and heart-healthy fats.  Work with your health care provider or diet and nutrition specialist (dietitian) to adjust your eating plan to your   individual calorie needs. This information is not intended to replace advice given to you by your health care provider. Make sure you discuss any questions you have with your health care provider. Document Revised: 09/13/2017 Document Reviewed: 09/24/2016 Elsevier Patient Education  2020 [place].  Preventing Type 2 Diabetes Mellitus Type 2 diabetes (type 2 diabetes mellitus) is a long-term (chronic) disease that affects blood  sugar (glucose) levels. Normally, a hormone called insulin allows glucose to enter cells in the body. The cells use glucose for energy. In type 2 diabetes, one or both of these problems may be present:  The body does not make enough insulin.  The body does not respond properly to insulin that it makes (insulin resistance). Insulin resistance or lack of insulin causes excess glucose to build up in the blood instead of going into cells. As a result, high blood glucose (hyperglycemia) develops, which can cause many complications. Being overweight or obese and having an inactive (sedentary) lifestyle can increase your risk for diabetes. Type 2 diabetes can be delayed or prevented by making certain nutrition and lifestyle changes. What nutrition changes can be made?   Eat healthy meals and snacks regularly. Keep a healthy snack with you for when you get hungry between meals, such as fruit or a handful of nuts.  Eat lean meats and proteins that are low in saturated fats, such as chicken, fish, egg whites, and beans. Avoid processed meats.  Eat plenty of fruits and vegetables and plenty of grains that have not been processed (whole grains). It is recommended that you eat: ? 1?2 cups of fruit every day. ? 2?3 cups of vegetables every day. ? 6?8 oz of whole grains every day, such as oats, whole wheat, bulgur, brown rice, quinoa, and millet.  Eat low-fat dairy products, such as milk, yogurt, and cheese.  Eat foods that contain healthy fats, such as nuts, avocado, olive oil, and canola oil.  Drink water throughout the day. Avoid drinks that contain added sugar, such as soda or sweet tea.  Follow instructions from your health care provider about specific eating or drinking restrictions.  Control how much food you eat at a time (portion size). ? Check food labels to find out the serving sizes of foods. ? Use a kitchen scale to weigh amounts of foods.  Saute or steam food instead of frying it. Cook  with water or broth instead of oils or butter.  Limit your intake of: ? Salt (sodium). Have no more than 1 tsp (2,400 mg) of sodium a day. If you have heart disease or high blood pressure, have less than ? tsp (1,500 mg) of sodium a day. ? Saturated fat. This is fat that is solid at room temperature, such as butter or fat on meat. What lifestyle changes can be made? Activity   Do moderate-intensity physical activity for at least 30 minutes on at least 5 days of the week, or as much as told by your health care provider.  Ask your health care provider what activities are safe for you. A mix of physical activities may be best, such as walking, swimming, cycling, and strength training.  Try to add physical activity into your day. For example: ? Park in spots that are farther away than usual, so that you walk more. For example, park in a far corner of the parking lot when you go to the office or the grocery store. ? Take a walk during your lunch break. ? Use stairs instead of elevators  or escalators. Weight Loss  Lose weight as directed. Your health care provider can determine how much weight loss is best for you and can help you lose weight safely.  If you are overweight or obese, you may be instructed to lose at least 5?7 % of your body weight. Alcohol and Tobacco   Limit alcohol intake to no more than 1 drink a day for nonpregnant women and 2 drinks a day for men. One drink equals 12 oz of beer, 5 oz of wine, or 1 oz of hard liquor.  Do not use any tobacco products, such as cigarettes, chewing tobacco, and e-cigarettes. If you need help quitting, ask your health care provider. Work With [place] Provider  Have your blood glucose tested regularly, as told by your health care provider.  Discuss your risk factors and how you can reduce your risk for diabetes.  Get screening tests as told by your health care provider. You may have screening tests regularly, especially if you  have certain risk factors for type 2 diabetes.  Make an appointment with a diet and nutrition specialist (registered dietitian). A registered dietitian can help you make a healthy eating plan and can help you understand portion sizes and food labels. Why are these changes important?  It is possible to prevent or delay type 2 diabetes and related health problems by making lifestyle and nutrition changes.  It can be difficult to recognize signs of type 2 diabetes. The best way to avoid possible damage to your body is to take actions to prevent the disease before you develop symptoms. What can happen if changes are not made?  Your blood glucose levels may keep increasing. Having high blood glucose for a long time is dangerous. Too much glucose in your blood can damage your blood vessels, heart, kidneys, nerves, and eyes.  You may develop prediabetes or type 2 diabetes. Type 2 diabetes can lead to many chronic health problems and complications, such as: ? Heart disease. ? Stroke. ? Blindness. ? Kidney disease. ? Depression. ? Poor circulation in the feet and legs, which could lead to surgical removal (amputation) in severe cases. Where to find support  Ask your health care provider to recommend a registered dietitian, diabetes educator, or weight loss program.  Look for local or online weight loss groups.  Join a gym, fitness club, or outdoor activity group, such as a walking club. Where to find more information To learn more about diabetes and diabetes prevention, visit:  American Diabetes Association (ADA): www.diabetes.[employer] of Diabetes and Digestive and Kidney Diseases: FindSpin.nl To learn more about healthy eating, visit:  The U.S. Department of Agriculture Scientist, research (physical sciences)), Choose My Plate: http://wiley-williams.com/  Office of Disease Prevention and Health Promotion (ODPHP), Dietary Guidelines:  SurferLive.at Summary  You can reduce your risk for type 2 diabetes by increasing your physical activity, eating healthy foods, and losing weight as directed.  Talk with your health care provider about your risk for type 2 diabetes. Ask about any blood tests or screening tests that you need to have. This information is not intended to replace advice given to you by your health care provider. Make sure you discuss any questions you have with your health care provider. Document Revised: 01/23/2019 Document Reviewed: 11/22/2015 Elsevier Patient Education  [place].

## 2020-06-01 ENCOUNTER — Other Ambulatory Visit: Payer: Self-pay

## 2020-06-01 DIAGNOSIS — R7303 Prediabetes: Secondary | ICD-10-CM

## 2020-06-01 DIAGNOSIS — I10 Essential (primary) hypertension: Secondary | ICD-10-CM

## 2020-06-02 LAB — COMPREHENSIVE METABOLIC PANEL
ALT: 14 IU/L (ref 0–44)
AST: 15 IU/L (ref 0–40)
Albumin/Globulin Ratio: 1.4 (ref 1.2–2.2)
Albumin: 4.4 g/dL (ref 3.8–4.9)
Alkaline Phosphatase: 80 IU/L (ref 48–121)
BUN/Creatinine Ratio: 10 (ref 9–20)
BUN: 17 mg/dL (ref 6–24)
Bilirubin Total: 0.4 mg/dL (ref 0.0–1.2)
CO2: 24 mmol/L (ref 20–29)
Calcium: 9.1 mg/dL (ref 8.7–10.2)
Chloride: 103 mmol/L (ref 96–106)
Creatinine, Ser: 1.69 mg/dL — ABNORMAL HIGH (ref 0.76–1.27)
GFR calc Af Amer: 52 mL/min/{1.73_m2} — ABNORMAL LOW (ref >59)
GFR calc non Af Amer: 45 mL/min/{1.73_m2} — ABNORMAL LOW (ref >59)
Globulin, Total: 3.2 g/dL (ref 1.5–4.5)
Glucose: 93 mg/dL (ref 65–99)
Potassium: 4.2 mmol/L (ref 3.5–5.2)
Sodium: 137 mmol/L (ref 134–144)
Total Protein: 7.6 g/dL (ref 6.0–8.5)

## 2020-06-02 LAB — HEMOGLOBIN A1C
Est. average glucose Bld gHb Est-mCnc: 117 mg/dL
Hgb A1c MFr Bld: 5.7 % — ABNORMAL HIGH (ref 4.8–5.6)

## 2020-06-09 ENCOUNTER — Other Ambulatory Visit: Payer: Self-pay | Admitting: Nephrology

## 2020-06-16 ENCOUNTER — Other Ambulatory Visit: Payer: Self-pay

## 2020-06-16 ENCOUNTER — Ambulatory Visit: Payer: Self-pay | Admitting: Gerontology

## 2020-06-16 VITALS — BP 122/84 | HR 74 | Ht 69.0 in | Wt 280.1 lb

## 2020-06-16 DIAGNOSIS — G8929 Other chronic pain: Secondary | ICD-10-CM

## 2020-06-16 DIAGNOSIS — N1831 Chronic kidney disease, stage 3a: Secondary | ICD-10-CM

## 2020-06-16 DIAGNOSIS — R7303 Prediabetes: Secondary | ICD-10-CM

## 2020-06-16 DIAGNOSIS — I10 Essential (primary) hypertension: Secondary | ICD-10-CM

## 2020-06-16 MED ORDER — METFORMIN HCL 1000 MG PO TABS: 500.0000 mg | ORAL_TABLET | Freq: Every day | ORAL | 2 refills | Status: DC

## 2020-06-16 NOTE — Progress Notes (Signed)
Established Patient Office Visit  Subjective:  Patient ID: Anthony Lam, male    DOB: January 20, 1966  Age: 54 y.o. MRN: [id number]  CC:  Chief Complaint  Patient presents with  . Follow-up  . Irregular Heart Beat    wants assessment of heart and lungs, was concerned after trying hyrdrocodone (?) and experienced negative symptoms    HPI Anthony Lam presents for follow up of hypertension and Prediabetes. He was seen by Dr Ellison Hughs.A for CKD Stage 3 on 06/09/2020 and was started on Chlorthalidone 25 mg daily. He reports that he stooped taking Chlorthalidone on 06/13/20 because he felt "funny and nauseaous". He states that he checks his blood pressure at home that it was in the low 130's/92. His HgbA1c done on 06/01/2020 decreased from 5.9% to 5.7%, and he continues on Metformin 500 mg daily.  He was seen at [facility] for Low back pain on 06/08/2020, and Physical Therapy was recommended. He denies bladder/ bowel incontinence and saddle anesthesia. Overall, he states that he's doing well and offers no further complaint.  Past Medical History:  Diagnosis Date  . Hypertension   . Migraines   . Seizures ([place])   . Sleep apnea   . TB (tuberculosis), treated 2009    Past Surgical History:  Procedure Laterality Date  . APPENDECTOMY    . COLONOSCOPY WITH PROPOFOL N/A 01/26/2020   Procedure: COLONOSCOPY WITH PROPOFOL;  Surgeon: Jonathon Bellows, MD;  [facility] END[facility]COPY;  Service: Gastroenterology;  Laterality: N/A;    Family History  Problem Relation Age of Onset  . Hypertension Mother   . Hypertension Father     Social History   Socioeconomic History  . Marital status: Single    Spouse name: Not on file  . Number of children: 2  . Years of education: Not on file  . Highest education level: 12th grade  Occupational History  . Not on file  Tobacco Use  . Smoking status: Light Tobacco Smoker    Packs/day: 0.25    Years: 15.00    Pack years: 3.75  . Smokeless tobacco: Never  Used  . Tobacco comment: The only time he smokes is when he has a beer 1 cigarette  Vaping Use  . Vaping Use: Never used  Substance and Sexual Activity  . Alcohol use: Yes    Alcohol/week: 1.0 standard drink    Types: 1 Cans of beer per week    Comment: 1-2 beers a week  . Drug use: Never  . Sexual activity: Yes  Other Topics Concern  . Not on file  Social History Narrative  . Not on file   Social Determinants of Health   Financial Resource Strain:   . Difficulty of Paying Living Expenses: Not on file  Food Insecurity:   . Worried About Charity fundraiser in the Last Year: Not on file  . Ran Out of Food in the Last Year: Not on file  Transportation Needs:   . Lack of Transportation (Medical): Not on file  . Lack of Transportation (Non-Medical): Not on file  Physical Activity:   . Days of Exercise per Week: Not on file  . Minutes of Exercise per Session: Not on file  Stress:   . Feeling of Stress : Not on file  Social Connections:   . Frequency of Communication with Friends and Family: Not on file  . Frequency of Social Gatherings with Friends and Family: Not on file  . Attends Religious Services: Not  on file  . Active Member of Clubs or Organizations: Not on file  . Attends Archivist Meetings: Not on file  . Marital Status: Not on file  Intimate Partner Violence:   . Fear of Current or Ex-Partner: Not on file  . Emotionally Abused: Not on file  . Physically Abused: Not on file  . Sexually Abused: Not on file    Outpatient Medications Prior to Visit  Medication Sig Dispense Refill  . amLODipine (NORVASC) 10 MG tablet Take 1 tablet (10 mg total) by mouth daily. 90 tablet 1  . gabapentin (NEURONTIN) 100 MG capsule Take 100 mg by mouth daily.    Marland Kitchen losartan (COZAAR) 50 MG tablet Take 1 tablet (50 mg total) by mouth daily. Pls provide 90 day supply 90 tablet 1  . Vitamin D, Ergocalciferol, (DRISDOL) 1.25 MG (50000 UNIT) CAPS capsule Take 1 capsule (50,000 Units  total) by mouth every 7 (seven) days. 16 capsule 0  . metFORMIN (GLUCOPHAGE) 1000 MG tablet Take 0.5 tablets (500 mg total) by mouth daily with breakfast. 30 tablet 2  . cetirizine (ZYRTEC ALLERGY) 10 MG tablet Take 1 tablet (10 mg total) by mouth daily. (Patient not taking: Reported on 06/16/2020) 30 tablet 1  . fluticasone (FLONASE) 50 MCG/ACT nasal spray Place 1 spray into both nostrils daily. (Patient not taking: Reported on 06/16/2020)    . gabapentin (NEURONTIN) 100 MG capsule Take 100 mg by mouth at bedtime.      No facility-administered medications prior to visit.    No Known Allergies  ROS Review of Systems  Constitutional: Negative.   HENT: Negative.   Eyes: Negative.   Respiratory: Negative.   Cardiovascular: Negative.   Endocrine: Negative.   Musculoskeletal: Positive for back pain (chronic back pain).  Neurological: Negative.       Objective:    Physical Exam Constitutional:      Appearance: Normal appearance.  HENT:     Head: Normocephalic and atraumatic.  Cardiovascular:     Rate and Rhythm: Normal rate and regular rhythm.     Pulses: Normal pulses.     Heart sounds: Normal heart sounds.  Pulmonary:     Effort: Pulmonary effort is normal.     Breath sounds: Normal breath sounds.  Musculoskeletal:        General: Normal range of motion.  Skin:    General: Skin is warm and dry.  Neurological:     General: No focal deficit present.     Mental Status: He is alert and oriented to person, place, and time. Mental status is at baseline.  Psychiatric:        Mood and Affect: Mood normal.        Behavior: Behavior normal.        Thought Content: Thought content normal.        Judgment: Judgment normal.     BP 122/84 (BP Location: Left Arm, Patient Position: Sitting)   Pulse 74   Ht 5\' 9"  (1.753 m)   Wt 280 lb 1.6 oz (127.1 kg)   SpO2 96%   BMI 41.36 kg/m  Wt Readings from Last 3 Encounters:  06/16/20 280 lb 1.6 oz (127.1 kg)  03/17/20 287 lb (130.2 kg)   02/24/20 284 lb 3.2 oz (128.9 kg)   He lost 7 pounds in 3 months and was encouraged to continue on his weight loss regimen.  Health Maintenance Due  Topic Date Due  . Hepatitis C Screening  Never done  .  COVID-19 Vaccine (1) Never done  . HIV Screening  Never done  . TETANUS/TDAP  Never done  . INFLUENZA VACCINE  05/15/2020    There are no preventive care reminders to display for this patient.  Lab Results  Component Value Date   TSH 1.860 07/10/2018   Lab Results  Component Value Date   WBC WILL FOLLOW 02/17/2020   WBC 6.4 02/17/2020   HGB WILL FOLLOW 02/17/2020   HGB 14.9 02/17/2020   HCT WILL FOLLOW 02/17/2020   HCT 47.1 02/17/2020   MCV WILL FOLLOW 02/17/2020   MCV 82 02/17/2020   PLT WILL FOLLOW 02/17/2020   PLT 275 02/17/2020   Lab Results  Component Value Date   NA 137 06/01/2020   K 4.2 06/01/2020   CO2 24 06/01/2020   GLUCOSE 93 06/01/2020   BUN 17 06/01/2020   CREATININE 1.69 (H) 06/01/2020   BILITOT 0.4 06/01/2020   ALKPHOS 80 06/01/2020   AST 15 06/01/2020   ALT 14 06/01/2020   PROT 7.6 06/01/2020   ALBUMIN 4.4 06/01/2020   CALCIUM 9.1 06/01/2020   ANIONGAP 6 (L) 08/04/2013   Lab Results  Component Value Date   CHOL 162 06/03/2019   Lab Results  Component Value Date   HDL 44 06/03/2019   Lab Results  Component Value Date   LDLCALC 97 06/03/2019   Lab Results  Component Value Date   TRIG 104 06/03/2019   Lab Results  Component Value Date   CHOLHDL 3.7 06/03/2019   Lab Results  Component Value Date   HGBA1C 5.7 (H) 06/01/2020      Assessment & Plan:    1. Essential hypertension - His blood pressure is not under control, his goal should be less than 130/80. He will continue on current treatment regimen, DASH diet and exercise as tolerated.  2. Stage 3a chronic kidney disease - He stopped 25 mg Chlorthalidone and he was advised to notify his Nephrologist Dr Ardyth Man.  3. Chronic low back pain, unspecified back pain  laterality, unspecified whether sciatica present - He will continue [facility] and he was advised to continue on his weight loss regimen.  4. Prediabetes - His HgbA1c was 5.7%, he will continue current treatment regimen, low carb/non concentrated sweet diet and exercise as tolerated. - metFORMIN (GLUCOPHAGE) 1000 MG tablet; Take 0.5 tablets (500 mg total) by mouth daily with breakfast.  Dispense: 30 tablet; Refill: 2    Follow-up: Return in about 3 months (around 09/15/2020), or if symptoms worsen or fail to improve.    [employer] Jerold Coombe, NP

## 2020-06-16 NOTE — Patient Instructions (Addendum)
Prediabetes Eating Plan Prediabetes is a condition that causes blood sugar (glucose) levels to be higher than normal. This increases the risk for developing diabetes. In order to prevent diabetes from developing, your health care provider may recommend a diet and other lifestyle changes to help you:  Control your blood glucose levels.  Improve your cholesterol levels.  Manage your blood pressure. Your health care provider may recommend working with a diet and nutrition specialist (dietitian) to make a meal plan that is best for you. What are tips for following this plan? Lifestyle  Set weight loss goals with the help of your health care team. It is recommended that most people with prediabetes lose 7% of their current body weight.  Exercise for at least 30 minutes at least 5 days a week.  Attend a support group or seek ongoing support from a mental health counselor.  Take over-the-counter and prescription medicines only as told by your health care provider. Reading food labels  Read food labels to check the amount of fat, salt (sodium), and sugar in prepackaged foods. Avoid foods that have: ? Saturated fats. ? Trans fats. ? Added sugars.  Avoid foods that have more than 300 milligrams (mg) of sodium per serving. Limit your daily sodium intake to less than 2,300 mg each day. Shopping  Avoid buying pre-made and processed foods. Cooking  Cook with olive oil. Do not use butter, lard, or ghee.  Bake, broil, grill, or boil foods. Avoid frying. Meal planning   Work with your dietitian to develop an eating plan that is right for you. This may include: ? Tracking how many calories you take in. Use a food diary, notebook, or mobile application to track what you eat at each meal. ? Using the glycemic index (GI) to plan your meals. The index tells you how quickly a food will raise your blood glucose. Choose low-GI foods. These foods take a longer time to raise blood glucose.  Consider  following a Mediterranean diet. This diet includes: ? Several servings each day of fresh fruits and vegetables. ? Eating fish at least twice a week. ? Several servings each day of whole grains, beans, nuts, and seeds. ? Using olive oil instead of other fats. ? Moderate alcohol consumption. ? Eating small amounts of red meat and whole-fat dairy.  If you have high blood pressure, you may need to limit your sodium intake or follow a diet such as the DASH eating plan. DASH is an eating plan that aims to lower high blood pressure. What foods are recommended? The items listed below may not be a complete list. Talk with your dietitian about what dietary choices are best for you. Grains Whole grains, such as whole-wheat or whole-grain breads, crackers, cereals, and pasta. Unsweetened oatmeal. Bulgur. Barley. Quinoa. Brown rice. Corn or whole-wheat flour tortillas or taco shells. Vegetables Lettuce. Spinach. Peas. Beets. Cauliflower. Cabbage. Broccoli. Carrots. Tomatoes. Squash. Eggplant. Herbs. Peppers. Onions. Cucumbers. Brussels sprouts. Fruits Berries. Bananas. Apples. Oranges. Grapes. Papaya. Mango. Pomegranate. Kiwi. Grapefruit. Cherries. Meats and other protein foods Seafood. Poultry without skin. Lean cuts of pork and beef. Tofu. Eggs. Nuts. Beans. Dairy Low-fat or fat-free dairy products, such as yogurt, cottage cheese, and cheese. Beverages Water. Tea. Coffee. Sugar-free or diet soda. Seltzer water. Lowfat or no-fat milk. Milk alternatives, such as soy or almond milk. Fats and oils Olive oil. Canola oil. Sunflower oil. Grapeseed oil. Avocado. Walnuts. Sweets and desserts Sugar-free or low-fat pudding. Sugar-free or low-fat ice cream and other frozen treats.   Seasoning and other foods Herbs. Sodium-free spices. Mustard. Relish. Low-fat, low-sugar ketchup. Low-fat, low-sugar barbecue sauce. Low-fat or fat-free mayonnaise. What foods are not recommended? The items listed below may not be a  complete list. Talk with your dietitian about what dietary choices are best for you. Grains Refined white flour and flour products, such as bread, pasta, snack foods, and cereals. Vegetables Canned vegetables. Frozen vegetables with butter or cream sauce. Fruits Fruits canned with syrup. Meats and other protein foods Fatty cuts of meat. Poultry with skin. Breaded or fried meat. Processed meats. Dairy Full-fat yogurt, cheese, or milk. Beverages Sweetened drinks, such as sweet iced tea and soda. Fats and oils Butter. Lard. Ghee. Sweets and desserts Baked goods, such as cake, cupcakes, pastries, cookies, and cheesecake. Seasoning and other foods Spice mixes with added salt. Ketchup. Barbecue sauce. Mayonnaise. Summary  To prevent diabetes from developing, you may need to make diet and other lifestyle changes to help control blood sugar, improve cholesterol levels, and manage your blood pressure.  Set weight loss goals with the help of your health care team. It is recommended that most people with prediabetes lose 7 percent of their current body weight.  Consider following a Mediterranean diet that includes plenty of fresh fruits and vegetables, whole grains, beans, nuts, seeds, fish, lean meat, low-fat dairy, and healthy oils. This information is not intended to replace advice given to you by your health care provider. Make sure you discuss any questions you have with your health care provider. Document Revised: 01/23/2019 Document Reviewed: 12/05/2016 [employer] Patient Education  2020 [employer] DASH Eating Plan DASH stands for "Dietary Approaches to Stop Hypertension." The DASH eating plan is a healthy eating plan that has been shown to reduce high blood pressure (hypertension). It may also reduce your risk for type 2 diabetes, heart disease, and stroke. The DASH eating plan may also help with weight loss. What are tips for following this plan?  General guidelines  Avoid eating  more than 2,300 mg (milligrams) of salt (sodium) a day. If you have hypertension, you may need to reduce your sodium intake to 1,500 mg a day.  Limit alcohol intake to no more than 1 drink a day for nonpregnant women and 2 drinks a day for men. One drink equals 12 oz of beer, 5 oz of wine, or 1 oz of hard liquor.  Work with your health care provider to maintain a healthy body weight or to lose weight. Ask what an ideal weight is for you.  Get at least 30 minutes of exercise that causes your heart to beat faster (aerobic exercise) most days of the week. Activities may include walking, swimming, or biking.  Work with your health care provider or diet and nutrition specialist (dietitian) to adjust your eating plan to your individual calorie needs. Reading food labels   Check food labels for the amount of sodium per serving. Choose foods with less than 5 percent of the Daily Value of sodium. Generally, foods with less than 300 mg of sodium per serving fit into this eating plan.  To find whole grains, look for the word "whole" as the first word in the ingredient list. Shopping  Buy products labeled as "low-sodium" or "no salt added."  Buy fresh foods. Avoid canned foods and premade or frozen meals. Cooking  Avoid adding salt when cooking. Use salt-free seasonings or herbs instead of table salt or sea salt. Check with your health care provider or pharmacist before using salt substitutes.    Do not fry foods. Cook foods using healthy methods such as baking, boiling, grilling, and broiling instead.  Cook with heart-healthy oils, such as olive, canola, soybean, or sunflower oil. Meal planning  Eat a balanced diet that includes: ? 5 or more servings of fruits and vegetables each day. At each meal, try to fill half of your plate with fruits and vegetables. ? Up to 6-8 servings of whole grains each day. ? Less than 6 oz of lean meat, poultry, or fish each day. A 3-oz serving of meat is about the  same size as a deck of cards. One egg equals 1 oz. ? 2 servings of low-fat dairy each day. ? A serving of nuts, seeds, or beans 5 times each week. ? Heart-healthy fats. Healthy fats called Omega-3 fatty acids are found in foods such as flaxseeds and coldwater fish, like sardines, salmon, and mackerel.  Limit how much you eat of the following: ? Canned or prepackaged foods. ? Food that is high in trans fat, such as fried foods. ? Food that is high in saturated fat, such as fatty meat. ? Sweets, desserts, sugary drinks, and other foods with added sugar. ? Full-fat dairy products.  Do not salt foods before eating.  Try to eat at least 2 vegetarian meals each week.  Eat more home-cooked food and less restaurant, buffet, and fast food.  When eating at a restaurant, ask that your food be prepared with less salt or no salt, if possible. What foods are recommended? The items listed may not be a complete list. Talk with your dietitian about what dietary choices are best for you. Grains Whole-grain or whole-wheat bread. Whole-grain or whole-wheat pasta. Brown rice. Oatmeal. Quinoa. Bulgur. Whole-grain and low-sodium cereals. Pita bread. Low-fat, low-sodium crackers. Whole-wheat flour tortillas. Vegetables Fresh or frozen vegetables (raw, steamed, roasted, or grilled). Low-sodium or reduced-sodium tomato and vegetable juice. Low-sodium or reduced-sodium tomato sauce and tomato paste. Low-sodium or reduced-sodium canned vegetables. Fruits All fresh, dried, or frozen fruit. Canned fruit in natural juice (without added sugar). Meat and other protein foods Skinless chicken or turkey. Ground chicken or turkey. Pork with fat trimmed off. Fish and seafood. Egg whites. Dried beans, peas, or lentils. Unsalted nuts, nut butters, and seeds. Unsalted canned beans. Lean cuts of beef with fat trimmed off. Low-sodium, lean deli meat. Dairy Low-fat (1%) or fat-free (skim) milk. Fat-free, low-fat, or reduced-fat  cheeses. Nonfat, low-sodium ricotta or cottage cheese. Low-fat or nonfat yogurt. Low-fat, low-sodium cheese. Fats and oils Soft margarine without trans fats. Vegetable oil. Low-fat, reduced-fat, or light mayonnaise and salad dressings (reduced-sodium). Canola, safflower, olive, soybean, and sunflower oils. Avocado. Seasoning and other foods Herbs. Spices. Seasoning mixes without salt. Unsalted popcorn and pretzels. Fat-free sweets. What foods are not recommended? The items listed may not be a complete list. Talk with your dietitian about what dietary choices are best for you. Grains Baked goods made with fat, such as croissants, muffins, or some breads. Dry pasta or rice meal packs. Vegetables Creamed or fried vegetables. Vegetables in a cheese sauce. Regular canned vegetables (not low-sodium or reduced-sodium). Regular canned tomato sauce and paste (not low-sodium or reduced-sodium). Regular tomato and vegetable juice (not low-sodium or reduced-sodium). Pickles. Olives. Fruits Canned fruit in a light or heavy syrup. Fried fruit. Fruit in cream or butter sauce. Meat and other protein foods Fatty cuts of meat. Ribs. Fried meat. Bacon. Sausage. Bologna and other processed lunch meats. Salami. Fatback. Hotdogs. Bratwurst. Salted nuts and seeds. Canned beans with   added salt. Canned or smoked fish. Whole eggs or egg yolks. Chicken or turkey with skin. Dairy Whole or 2% milk, cream, and half-and-half. Whole or full-fat cream cheese. Whole-fat or sweetened yogurt. Full-fat cheese. Nondairy creamers. Whipped toppings. Processed cheese and cheese spreads. Fats and oils Butter. Stick margarine. Lard. Shortening. Ghee. Bacon fat. Tropical oils, such as coconut, palm kernel, or palm oil. Seasoning and other foods Salted popcorn and pretzels. Onion salt, garlic salt, seasoned salt, table salt, and sea salt. Worcestershire sauce. Tartar sauce. Barbecue sauce. Teriyaki sauce. Soy sauce, including  reduced-sodium. Steak sauce. Canned and packaged gravies. Fish sauce. Oyster sauce. Cocktail sauce. Horseradish that you find on the shelf. Ketchup. Mustard. Meat flavorings and tenderizers. Bouillon cubes. Hot sauce and Tabasco sauce. Premade or packaged marinades. Premade or packaged taco seasonings. Relishes. Regular salad dressings. Where to find more information:  National Heart, Lung, and [employer] Institute: www.nhlbi.nih.gov  American Heart Association: www.heart.org Summary  The DASH eating plan is a healthy eating plan that has been shown to reduce high blood pressure (hypertension). It may also reduce your risk for type 2 diabetes, heart disease, and stroke.  With the DASH eating plan, you should limit salt (sodium) intake to 2,300 mg a day. If you have hypertension, you may need to reduce your sodium intake to 1,500 mg a day.  When on the DASH eating plan, aim to eat more fresh fruits and vegetables, whole grains, lean proteins, low-fat dairy, and heart-healthy fats.  Work with your health care provider or diet and nutrition specialist (dietitian) to adjust your eating plan to your individual calorie needs. This information is not intended to replace advice given to you by your health care provider. Make sure you discuss any questions you have with your health care provider. Document Revised: 09/13/2017 Document Reviewed: 09/24/2016 [employer] Patient Education  2020 [employer]  

## 2020-09-15 ENCOUNTER — Ambulatory Visit: Payer: Self-pay | Admitting: Gerontology

## 2020-09-15 ENCOUNTER — Encounter: Payer: Self-pay | Admitting: Gerontology

## 2020-09-15 ENCOUNTER — Other Ambulatory Visit: Payer: Self-pay

## 2020-09-15 ENCOUNTER — Other Ambulatory Visit: Payer: Self-pay | Admitting: Gerontology

## 2020-09-15 VITALS — BP 149/89 | HR 76 | Ht 69.0 in | Wt 284.4 lb

## 2020-09-15 DIAGNOSIS — Z0000 Encounter for general adult medical examination without abnormal findings: Secondary | ICD-10-CM

## 2020-09-15 DIAGNOSIS — M545 Low back pain, unspecified: Secondary | ICD-10-CM

## 2020-09-15 DIAGNOSIS — R7303 Prediabetes: Secondary | ICD-10-CM

## 2020-09-15 DIAGNOSIS — G8929 Other chronic pain: Secondary | ICD-10-CM

## 2020-09-15 DIAGNOSIS — I10 Essential (primary) hypertension: Secondary | ICD-10-CM

## 2020-09-15 DIAGNOSIS — F419 Anxiety disorder, unspecified: Secondary | ICD-10-CM

## 2020-09-15 MED ORDER — AMLODIPINE BESYLATE 10 MG PO TABS: 10.0000 mg | ORAL_TABLET | Freq: Every day | ORAL | 1 refills | Status: DC

## 2020-09-15 MED ORDER — LOSARTAN POTASSIUM 50 MG PO TABS: 50.0000 mg | ORAL_TABLET | Freq: Every day | ORAL | 1 refills | Status: DC

## 2020-09-15 MED ORDER — METFORMIN HCL 1000 MG PO TABS: 500.0000 mg | ORAL_TABLET | Freq: Every day | ORAL | 2 refills | Status: DC

## 2020-09-15 NOTE — Patient Instructions (Signed)
Calorie Counting for Weight Loss Calories are units of energy. Your body needs a certain amount of calories from food to keep you going throughout the day. When you eat more calories than your body needs, your body stores the extra calories as fat. When you eat fewer calories than your body needs, your body burns fat to get the energy it needs. Calorie counting means keeping track of how many calories you eat and drink each day. Calorie counting can be helpful if you need to lose weight. If you make sure to eat fewer calories than your body needs, you should lose weight. Ask your health care provider what a healthy weight is for you. For calorie counting to work, you will need to eat the right number of calories in a day in order to lose a healthy amount of weight per week. A dietitian can help you determine how many calories you need in a day and will give you suggestions on how to reach your calorie goal.  A healthy amount of weight to lose per week is usually 1-2 lb (0.5-0.9 kg). This usually means that your daily calorie intake should be reduced by 500-750 calories.  Eating 1,200 - 1,500 calories per day can help most women lose weight.  Eating 1,500 - 1,800 calories per day can help most men lose weight. What is my plan? My goal is to have __________ calories per day. If I have this many calories per day, I should lose around __________ pounds per week. What do I need to know about calorie counting? In order to meet your daily calorie goal, you will need to:  Find out how many calories are in each food you would like to eat. Try to do this before you eat.  Decide how much of the food you plan to eat.  Write down what you ate and how many calories it had. Doing this is called keeping a food log. To successfully lose weight, it is important to balance calorie counting with a healthy lifestyle that includes regular activity. Aim for 150 minutes of moderate exercise (such as walking) or 75  minutes of vigorous exercise (such as running) each week. Where do I find calorie information?  The number of calories in a food can be found on a Nutrition Facts label. If a food does not have a Nutrition Facts label, try to look up the calories online or ask your dietitian for help. Remember that calories are listed per serving. If you choose to have more than one serving of a food, you will have to multiply the calories per serving by the amount of servings you plan to eat. For example, the label on a package of bread might say that a serving size is 1 slice and that there are 90 calories in a serving. If you eat 1 slice, you will have eaten 90 calories. If you eat 2 slices, you will have eaten 180 calories. How do I keep a food log? Immediately after each meal, record the following information in your food log:  What you ate. Don't forget to include toppings, sauces, and other extras on the food.  How much you ate. This can be measured in cups, ounces, or number of items.  How many calories each food and drink had.  The total number of calories in the meal. Keep your food log near you, such as in a small notebook in your pocket, or use a mobile app or website. Some programs will calculate   calories for you and show you how many calories you have left for the day to meet your goal. What are some calorie counting tips?   Use your calories on foods and drinks that will fill you up and not leave you hungry: ? Some examples of foods that fill you up are nuts and nut butters, vegetables, lean proteins, and high-fiber foods like whole grains. High-fiber foods are foods with more than 5 g fiber per serving. ? Drinks such as sodas, specialty coffee drinks, alcohol, and juices have a lot of calories, yet do not fill you up.  Eat nutritious foods and avoid empty calories. Empty calories are calories you get from foods or beverages that do not have many vitamins or protein, such as candy, sweets, and  soda. It is better to have a nutritious high-calorie food (such as an avocado) than a food with few nutrients (such as a bag of chips).  Know how many calories are in the foods you eat most often. This will help you calculate calorie counts faster.  Pay attention to calories in drinks. Low-calorie drinks include water and unsweetened drinks.  Pay attention to nutrition labels for "low fat" or "fat free" foods. These foods sometimes have the same amount of calories or more calories than the full fat versions. They also often have added sugar, starch, or salt, to make up for flavor that was removed with the fat.  Find a way of tracking calories that works for you. Get creative. Try different apps or programs if writing down calories does not work for you. What are some portion control tips?  Know how many calories are in a serving. This will help you know how many servings of a certain food you can have.  Use a measuring cup to measure serving sizes. You could also try weighing out portions on a kitchen scale. With time, you will be able to estimate serving sizes for some foods.  Take some time to put servings of different foods on your favorite plates, bowls, and cups so you know what a serving looks like.  Try not to eat straight from a bag or box. Doing this can lead to overeating. Put the amount you would like to eat in a cup or on a plate to make sure you are eating the right portion.  Use smaller plates, glasses, and bowls to prevent overeating.  Try not to multitask (for example, watch TV or use your computer) while eating. If it is time to eat, sit down at a table and enjoy your food. This will help you to know when you are full. It will also help you to be aware of what you are eating and how much you are eating. What are tips for following this plan? Reading food labels  Check the calorie count compared to the serving size. The serving size may be smaller than what you are used to  eating.  Check the source of the calories. Make sure the food you are eating is high in vitamins and protein and low in saturated and trans fats. Shopping  Read nutrition labels while you shop. This will help you make healthy decisions before you decide to purchase your food.  Make a grocery list and stick to it. Cooking  Try to cook your favorite foods in a healthier way. For example, try baking instead of frying.  Use low-fat dairy products. Meal planning  Use more fruits and vegetables. Half of your plate should be fruits   and vegetables.  Include lean proteins like poultry and fish. How do I count calories when eating out?  Ask for smaller portion sizes.  Consider sharing an entree and sides instead of getting your own entree.  If you get your own entree, eat only half. Ask for a box at the beginning of your meal and put the rest of your entree in it so you are not tempted to eat it.  If calories are listed on the menu, choose the lower calorie options.  Choose dishes that include vegetables, fruits, whole grains, low-fat dairy products, and lean protein.  Choose items that are boiled, broiled, grilled, or steamed. Stay away from items that are buttered, battered, fried, or served with cream sauce. Items labeled "crispy" are usually fried, unless stated otherwise.  Choose water, low-fat milk, unsweetened iced tea, or other drinks without added sugar. If you want an alcoholic beverage, choose a lower calorie option such as a glass of wine or light beer.  Ask for dressings, sauces, and syrups on the side. These are usually high in calories, so you should limit the amount you eat.  If you want a salad, choose a garden salad and ask for grilled meats. Avoid extra toppings like bacon, cheese, or fried items. Ask for the dressing on the side, or ask for olive oil and vinegar or lemon to use as dressing.  Estimate how many servings of a food you are given. For example, a serving of  cooked rice is  cup or about the size of half a baseball. Knowing serving sizes will help you be aware of how much food you are eating at restaurants. The list below tells you how big or small some common portion sizes are based on everyday objects: ? 1 oz--4 stacked dice. ? 3 oz--1 deck of cards. ? 1 tsp--1 die. ? 1 Tbsp-- a ping-pong ball. ? 2 Tbsp--1 ping-pong ball. ?  cup-- baseball. ? 1 cup--1 baseball. Summary  Calorie counting means keeping track of how many calories you eat and drink each day. If you eat fewer calories than your body needs, you should lose weight.  A healthy amount of weight to lose per week is usually 1-2 lb (0.5-0.9 kg). This usually means reducing your daily calorie intake by 500-750 calories.  The number of calories in a food can be found on a Nutrition Facts label. If a food does not have a Nutrition Facts label, try to look up the calories online or ask your dietitian for help.  Use your calories on foods and drinks that will fill you up, and not on foods and drinks that will leave you hungry.  Use smaller plates, glasses, and bowls to prevent overeating. This information is not intended to replace advice given to you by your health care provider. Make sure you discuss any questions you have with your health care provider. Document Revised: 06/20/2018 Document Reviewed: 08/31/2016 Elsevier Patient Education  [place] DASH stands for "Dietary Approaches to Stop Hypertension." The DASH eating plan is a healthy eating plan that has been shown to reduce high blood pressure (hypertension). It may also reduce your risk for type 2 diabetes, heart disease, and stroke. The DASH eating plan may also help with weight loss. What are tips for following this plan?  General guidelines  Avoid eating more than 2,300 mg (milligrams) of salt (sodium) a day. If you have hypertension, you may need to reduce your sodium intake to 1,500 mg  a  day.  Limit alcohol intake to no more than 1 drink a day for nonpregnant women and 2 drinks a day for men. One drink equals 12 oz of beer, 5 oz of wine, or 1 oz of hard liquor.  Work with your health care provider to maintain a healthy body weight or to lose weight. Ask what an ideal weight is for you.  Get at least 30 minutes of exercise that causes your heart to beat faster (aerobic exercise) most days of the week. Activities may include walking, swimming, or biking.  Work with your health care provider or diet and nutrition specialist (dietitian) to adjust your eating plan to your individual calorie needs. Reading food labels   Check food labels for the amount of sodium per serving. Choose foods with less than 5 percent of the Daily Value of sodium. Generally, foods with less than 300 mg of sodium per serving fit into this eating plan.  To find whole grains, look for the word "whole" as the first word in the ingredient list. Shopping  Buy products labeled as "low-sodium" or "no salt added."  Buy fresh foods. Avoid canned foods and premade or frozen meals. Cooking  Avoid adding salt when cooking. Use salt-free seasonings or herbs instead of table salt or sea salt. Check with your health care provider or pharmacist before using salt substitutes.  Do not fry foods. Cook foods using healthy methods such as baking, boiling, grilling, and broiling instead.  Cook with heart-healthy oils, such as olive, canola, soybean, or sunflower oil. Meal planning  Eat a balanced diet that includes: ? 5 or more servings of fruits and vegetables each day. At each meal, try to fill half of your plate with fruits and vegetables. ? Up to 6-8 servings of whole grains each day. ? Less than 6 oz of lean meat, poultry, or fish each day. A 3-oz serving of meat is about the same size as a deck of cards. One egg equals 1 oz. ? 2 servings of low-fat dairy each day. ? A serving of nuts, seeds, or beans 5 times  each week. ? Heart-healthy fats. Healthy fats called Omega-3 fatty acids are found in foods such as flaxseeds and coldwater fish, like sardines, salmon, and mackerel.  Limit how much you eat of the following: ? Canned or prepackaged foods. ? Food that is high in trans fat, such as fried foods. ? Food that is high in saturated fat, such as fatty meat. ? Sweets, desserts, sugary drinks, and other foods with added sugar. ? Full-fat dairy products.  Do not salt foods before eating.  Try to eat at least 2 vegetarian meals each week.  Eat more home-cooked food and less restaurant, buffet, and fast food.  When eating at a restaurant, ask that your food be prepared with less salt or no salt, if possible. What foods are recommended? The items listed may not be a complete list. Talk with your dietitian about what dietary choices are best for you. Grains Whole-grain or whole-wheat bread. Whole-grain or whole-wheat pasta. Brown rice. [place]. Bulgur. Whole-grain and low-sodium cereals. Pita bread. Low-fat, low-sodium crackers. Whole-wheat flour tortillas. Vegetables Fresh or frozen vegetables (raw, steamed, roasted, or grilled). Low-sodium or reduced-sodium tomato and vegetable juice. Low-sodium or reduced-sodium tomato sauce and tomato paste. Low-sodium or reduced-sodium canned vegetables. Fruits All fresh, dried, or frozen fruit. Canned fruit in natural juice (without added sugar). Meat and other protein foods Skinless chicken or [place]. Ground chicken or [place].  Pork with fat trimmed off. Fish and seafood. Egg whites. Dried beans, peas, or lentils. Unsalted nuts, nut butters, and seeds. Unsalted canned beans. Lean cuts of beef with fat trimmed off. Low-sodium, lean deli meat. Dairy Low-fat (1%) or fat-free (skim) milk. Fat-free, low-fat, or reduced-fat cheeses. Nonfat, low-sodium ricotta or cottage cheese. Low-fat or nonfat yogurt. Low-fat, low-sodium cheese. Fats and oils Soft margarine  without trans fats. Vegetable oil. Low-fat, reduced-fat, or light mayonnaise and salad dressings (reduced-sodium). Canola, safflower, olive, soybean, and sunflower oils. Avocado. Seasoning and other foods Herbs. Spices. Seasoning mixes without salt. Unsalted popcorn and pretzels. Fat-free sweets. What foods are not recommended? The items listed may not be a complete list. Talk with your dietitian about what dietary choices are best for you. Grains Baked goods made with fat, such as croissants, muffins, or some breads. Dry pasta or rice meal packs. Vegetables Creamed or fried vegetables. Vegetables in a cheese sauce. Regular canned vegetables (not low-sodium or reduced-sodium). Regular canned tomato sauce and paste (not low-sodium or reduced-sodium). Regular tomato and vegetable juice (not low-sodium or reduced-sodium). Angie Fava. Olives. Fruits Canned fruit in a light or heavy syrup. Fried fruit. Fruit in cream or butter sauce. Meat and other protein foods Fatty cuts of meat. Ribs. Fried meat. Berniece Salines. Sausage. Bologna and other processed lunch meats. Salami. Fatback. Hotdogs. Bratwurst. Salted nuts and seeds. Canned beans with added salt. Canned or smoked fish. Whole eggs or egg yolks. Chicken or [place] with skin. Dairy Whole or 2% milk, cream, and half-and-half. Whole or full-fat cream cheese. Whole-fat or sweetened yogurt. Full-fat cheese. Nondairy creamers. Whipped toppings. Processed cheese and cheese spreads. Fats and oils Butter. Stick margarine. Lard. Shortening. Ghee. Bacon fat. Tropical oils, such as coconut, palm kernel, or palm oil. Seasoning and other foods Salted popcorn and pretzels. Onion salt, garlic salt, seasoned salt, table salt, and sea salt. Worcestershire sauce. Tartar sauce. Barbecue sauce. Teriyaki sauce. Soy sauce, including reduced-sodium. Steak sauce. Canned and packaged gravies. Fish sauce. Oyster sauce. Cocktail sauce. Horseradish that you find on the shelf. Ketchup.  Mustard. Meat flavorings and tenderizers. Bouillon cubes. Hot sauce and Tabasco sauce. Premade or packaged marinades. Premade or packaged taco seasonings. Relishes. Regular salad dressings. Where to find more information:  National Heart, Lung, and [place]: https://wilson-eaton.com/  American Heart Association: www.heart.org Summary  The DASH eating plan is a healthy eating plan that has been shown to reduce high blood pressure (hypertension). It may also reduce your risk for type 2 diabetes, heart disease, and stroke.  With the DASH eating plan, you should limit salt (sodium) intake to 2,300 mg a day. If you have hypertension, you may need to reduce your sodium intake to 1,500 mg a day.  When on the DASH eating plan, aim to eat more fresh fruits and vegetables, whole grains, lean proteins, low-fat dairy, and heart-healthy fats.  Work with your health care provider or diet and nutrition specialist (dietitian) to adjust your eating plan to your individual calorie needs. This information is not intended to replace advice given to you by your health care provider. Make sure you discuss any questions you have with your health care provider. Document Revised: 09/13/2017 Document Reviewed: 09/24/2016 Elsevier Patient Education  2020 [employer].

## 2020-09-15 NOTE — Progress Notes (Signed)
Established Patient Office Visit  Subjective:  Patient ID: Anthony Lam, male    DOB: 01-08-1966  Age: 54 y.o. MRN: [id number]  CC:  Chief Complaint  Patient presents with  . Anxiety    Feeling fatigue, depression, nervous  . Knee Pain    HPI [name] presents for follow up of hypertension and medication refill. He states that he's compliant with his medications and continues to make healthy lifestyle changes. He states that he doesn't check his blood pressure as he should, denies headache, chest pain, light headedness and vision changes. He continues to follow up with [facility] Physical therapy for chronic back and bilateral knee pain. Currently, he c/o worsening anxiety that has been going on for many months and requests Mental health referral. He denies suicidal nor homicidal ideation and states that he's in a good mood. Overall, he states that he's doing well and has started going to the [facility], and he offers no further complaint.  Past Medical History:  Diagnosis Date  . Hypertension   . Migraines   . Seizures ([place])   . Sleep apnea   . TB (tuberculosis), treated 2009    Past Surgical History:  Procedure Laterality Date  . APPENDECTOMY    . COLONOSCOPY WITH PROPOFOL N/A 01/26/2020   Procedure: COLONOSCOPY WITH PROPOFOL;  Surgeon: Jonathon Bellows, MD;  Location: [facility] ENDOSCOPY;  Service: Gastroenterology;  Laterality: N/A;    Family History  Problem Relation Age of Onset  . Hypertension Mother   . Hypertension Father     Social History   Socioeconomic History  . Marital status: Single    Spouse name: Not on file  . Number of children: 2  . Years of education: Not on file  . Highest education level: 12th grade  Occupational History  . Not on file  Tobacco Use  . Smoking status: Light Tobacco Smoker    Packs/day: 0.25    Years: 15.00    Pack years: 3.75  . Smokeless tobacco: Never Used  . Tobacco comment: The only time he smokes is when he has a beer 1 cigarette   Vaping Use  . Vaping Use: Never used  Substance and Sexual Activity  . Alcohol use: Yes    Alcohol/week: 1.0 standard drink    Types: 1 Cans of beer per week    Comment: 1-2 beers a week  . Drug use: Never  . Sexual activity: Yes  Other Topics Concern  . Not on file  Social History Narrative  . Not on file   Social Determinants of Health   Financial Resource Strain:   . Difficulty of Paying Living Expenses: Not on file  Food Insecurity:   . Worried About Charity fundraiser in the Last Year: Not on file  . Ran Out of Food in the Last Year: Not on file  Transportation Needs:   . Lack of Transportation (Medical): Not on file  . Lack of Transportation (Non-Medical): Not on file  Physical Activity:   . Days of Exercise per Week: Not on file  . Minutes of Exercise per Session: Not on file  Stress:   . Feeling of Stress : Not on file  Social Connections:   . Frequency of Communication with Friends and Family: Not on file  . Frequency of Social Gatherings with Friends and Family: Not on file  . Attends Religious Services: Not on file  . Active Member of Clubs or Organizations: Not on file  . Attends Club  or Organization Meetings: Not on file  . Marital Status: Not on file  Intimate Partner Violence:   . Fear of Current or Ex-Partner: Not on file  . Emotionally Abused: Not on file  . Physically Abused: Not on file  . Sexually Abused: Not on file    Outpatient Medications Prior to Visit  Medication Sig Dispense Refill  . cetirizine (ZYRTEC ALLERGY) 10 MG tablet Take 1 tablet (10 mg total) by mouth daily. 30 tablet 1  . fluticasone (FLONASE) 50 MCG/ACT nasal spray Place 1 spray into both nostrils daily.     Marland Kitchen gabapentin (NEURONTIN) 100 MG capsule Take 100 mg by mouth daily.    . Vitamin D, Ergocalciferol, (DRISDOL) 1.25 MG (50000 UNIT) CAPS capsule Take 1 capsule (50,000 Units total) by mouth every 7 (seven) days. 16 capsule 0  . amLODipine (NORVASC) 10 MG tablet Take 1  tablet (10 mg total) by mouth daily. 90 tablet 1  . losartan (COZAAR) 50 MG tablet Take 1 tablet (50 mg total) by mouth daily. Pls provide 90 day supply 90 tablet 1  . metFORMIN (GLUCOPHAGE) 1000 MG tablet Take 0.5 tablets (500 mg total) by mouth daily with breakfast. 30 tablet 2   No facility-administered medications prior to visit.    No Known Allergies  ROS Review of Systems  Constitutional: Negative.   Eyes: Negative.   Respiratory: Negative.   Endocrine: Negative.   Musculoskeletal: Positive for back pain (chronic back pain).  Neurological: Negative.   Psychiatric/Behavioral: The patient is nervous/anxious.       Objective:    Physical Exam HENT:     Head: Normocephalic and atraumatic.  Eyes:     Extraocular Movements: Extraocular movements intact.     Conjunctiva/sclera: Conjunctivae normal.     Pupils: Pupils are equal, round, and reactive to light.  Cardiovascular:     Rate and Rhythm: Normal rate and regular rhythm.     Pulses: Normal pulses.     Heart sounds: Normal heart sounds.  Pulmonary:     Effort: Pulmonary effort is normal.     Breath sounds: Normal breath sounds.  Skin:    General: Skin is warm.  Neurological:     General: No focal deficit present.     Mental Status: He is alert and oriented to person, place, and time. Mental status is at baseline.  Psychiatric:        Mood and Affect: Mood normal.        Behavior: Behavior normal.        Thought Content: Thought content normal.        Judgment: Judgment normal.     BP (!) 149/89 (BP Location: Right Arm, Patient Position: Sitting)   Pulse 76   Ht _0  (1.753 m)   Wt 284 lb 6.4 oz (129 kg)   SpO2 94%   BMI 42.00 kg/m  Wt Readings from Last 3 Encounters:  09/15/20 284 lb 6.4 oz (129 kg)  06/16/20 280 lb 1.6 oz (127.1 kg)  03/17/20 287 lb (130.2 kg)   He was encouraged to loose weight.  Health Maintenance Due  Topic Date Due  . Hepatitis C Screening  Never done  . COVID-19 Vaccine (1)  Never done  . HIV Screening  Never done  . TETANUS/TDAP  Never done  . INFLUENZA VACCINE  05/15/2020    There are no preventive care reminders to display for this patient.  Lab Results  Component Value Date   TSH 1.860 07/10/2018  Lab Results  Component Value Date   WBC WILL FOLLOW 02/17/2020   WBC 6.4 02/17/2020   HGB WILL FOLLOW 02/17/2020   HGB 14.9 02/17/2020   HCT WILL FOLLOW 02/17/2020   HCT 47.1 02/17/2020   MCV WILL FOLLOW 02/17/2020   MCV 82 02/17/2020   PLT WILL FOLLOW 02/17/2020   PLT 275 02/17/2020   Lab Results  Component Value Date   NA 137 06/01/2020   K 4.2 06/01/2020   CO2 24 06/01/2020   GLUCOSE 93 06/01/2020   BUN 17 06/01/2020   CREATININE 1.69 (H) 06/01/2020   BILITOT 0.4 06/01/2020   ALKPHOS 80 06/01/2020   AST 15 06/01/2020   ALT 14 06/01/2020   PROT 7.6 06/01/2020   ALBUMIN 4.4 06/01/2020   CALCIUM 9.1 06/01/2020   ANIONGAP 6 (L) 08/04/2013   Lab Results  Component Value Date   CHOL 162 06/03/2019   Lab Results  Component Value Date   HDL 44 06/03/2019   Lab Results  Component Value Date   LDLCALC 97 06/03/2019   Lab Results  Component Value Date   TRIG 104 06/03/2019   Lab Results  Component Value Date   CHOLHDL 3.7 06/03/2019   Lab Results  Component Value Date   HGBA1C 5.7 (H) 06/01/2020      Assessment & Plan:   1. Essential hypertension - His blood pressure is not under control, he was encouraged to continue on current treatment regimen, DASH diet and loose weight . His goal blood pressure should be less than 130/80. - amLODipine (NORVASC) 10 MG tablet; Take 1 tablet (10 mg total) by mouth daily.  Dispense: 90 tablet; Refill: 1 - losartan (COZAAR) 50 MG tablet; Take 1 tablet (50 mg total) by mouth daily. Pls provide 90 day supply  Dispense: 90 tablet; Refill: 1 - Comp Met (CMET); Future - Vitamin D (25 hydroxy); Future - Urinalysis; Future  2. Prediabetes - He will continue on current treatment regimen, low  carb /non concentrated sweet diet and exercise as tolerated. - metFORMIN (GLUCOPHAGE) 1000 MG tablet; Take 0.5 tablets (500 mg total) by mouth daily with breakfast.  Dispense: 30 tablet; Refill: 2 - HgB A1c; Future  3. Chronic low back pain, unspecified back pain laterality, unspecified whether sciatica present - He was encouraged to continue his Physical Therapy at [facility] .  4. Health care maintenance  - Flu Vaccine QUAD 6+ mos PF IM (Fluarix Quad PF) was administered  5. Anxiety - He will follow up with [facility] and was provided with Crisis help line information. He was advised to call with worsening symptoms or go to the ED.     Follow-up: Return in about 3 months (around 12/14/2020), or if symptoms worsen or fail to improve.    Lucca Ballo Jerold Coombe, NP

## 2020-10-18 ENCOUNTER — Telehealth: Payer: Self-pay | Admitting: Licensed Clinical Social Worker

## 2020-10-18 ENCOUNTER — Institutional Professional Consult (permissible substitution): Payer: Self-pay | Admitting: Licensed Clinical Social Worker

## 2020-10-18 NOTE — Telephone Encounter (Signed)
Lmom to inform patient of resched appt from  to 1/4 to 1/5 at 8 am due to [facility] being out of the office.

## 2020-10-19 ENCOUNTER — Institutional Professional Consult (permissible substitution): Payer: Self-pay | Admitting: Licensed Clinical Social Worker

## 2020-10-27 ENCOUNTER — Ambulatory Visit: Payer: Self-pay | Admitting: Licensed Clinical Social Worker

## 2020-10-27 ENCOUNTER — Other Ambulatory Visit: Payer: Self-pay

## 2020-10-27 ENCOUNTER — Institutional Professional Consult (permissible substitution): Payer: Self-pay | Admitting: Licensed Clinical Social Worker

## 2020-10-27 DIAGNOSIS — F419 Anxiety disorder, unspecified: Secondary | ICD-10-CM
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
## 2020-11-02 ENCOUNTER — Ambulatory Visit: Payer: Self-pay | Admitting: Licensed Clinical Social Worker

## 2020-11-02 ENCOUNTER — Other Ambulatory Visit: Payer: Self-pay | Admitting: Gerontology

## 2020-11-02 DIAGNOSIS — F419 Anxiety disorder, unspecified: Secondary | ICD-10-CM

## 2020-11-02 DIAGNOSIS — R0981 Nasal congestion: Secondary | ICD-10-CM

## 2020-11-03 ENCOUNTER — Other Ambulatory Visit: Payer: Self-pay | Admitting: Gerontology

## 2020-11-03 DIAGNOSIS — F419 Anxiety disorder, unspecified: Secondary | ICD-10-CM

## 2020-11-03 MED ORDER — GABAPENTIN 100 MG PO CAPS
100.0000 mg | ORAL_CAPSULE | Freq: Three times a day (TID) | ORAL | 1 refills | Status: DC
Start: 2020-11-03 — End: 2020-11-03

## 2020-11-03 NOTE — Progress Notes (Signed)
After consulting with Dr Octavia Heir and Ms. Jerrilyn Cairo, his Gabapentin was increased to 100 mg tid for his anxiety. He was advised to notify clinic with any concerns.

## 2020-11-09 ENCOUNTER — Ambulatory Visit: Payer: Self-pay | Admitting: Licensed Clinical Social Worker

## 2020-11-09 ENCOUNTER — Other Ambulatory Visit: Payer: Self-pay

## 2020-11-09 DIAGNOSIS — F411 Generalized anxiety disorder: Secondary | ICD-10-CM

## 2020-11-09 NOTE — BH Specialist Note (Signed)
Integrated Behavioral Health Follow Up In-Person Visit  MRN: [id number] Name: Anthony Lam   Total time: 60 minutes  Types of Service: [place] (B[facility])  Interpretor:Yes.   Interpretor Name and Language:   Subjective: Anthony Lam is a 55 y.o. male accompanied by himself Patient was referred by Carlyon Shadow, NP for mental health.  Patient reports the following symptoms/concerns: The patient stated he is doing "so-so." He stated that he got a call from the [facility] regarding increasing his medication, but he was confused and didn't understand what they meant. He reports that his girlfriend handles keeping his medication organized and writes down a list for him to follow so he can remember when to take what. The patient notes that he prefers morning appointments because his medication makes him sleepy and he prefers to be home where he feels safe and can rest. The patient states he is not sure about doing therapy every week. He would prefer a different schedule less often. He notes that he is willing to come to the clinic to discuss it further, but is not sure what time will work best. The patient denies any suicidal or homicidal thoughts Duration of problem: ; Severity of problem: moderate  Objective: Mood: Euthymic and Affect: Appropriate Risk of harm to self or others: No plan to harm self or others  Life Context: Family and Social: see above School/Work: see above Self-Care: see above Life Changes: see above  Patient and/or Family's Strengths/Protective Factors: Concrete supports in place (healthy food, safe environments, etc.)  Goals Addressed: Patient will: 1.  Reduce symptoms of: anxiety, depression and stress  2.  Increase knowledge and/or ability of: coping skills, self-management skills and stress reduction  3.  Demonstrate ability to: Increase healthy adjustment to current life circumstances  Progress towards  Goals: Ongoing  Interventions: Interventions utilized:  CBT Cognitive Behavioral Therapy was utilized by clinician during today's follow-up session. Clinician processed with the patient how he has been doing since his last follow up session. Clinician provided space for the patient to ventilate his frustrations with his current life circumstances. Clinician provided education regarding the [place] recommendations for therapy and medication adjustments. Clinician explained to the patient that per psychiatric consultation the recommendation is for him to increase his Gabapentin 100 MG from once per day to three times per day.Clinician measured the patient's anxiety and depression on a numerical scale. Standardized Assessments completed: GAD-7 and PHQ 9  GAD-7     19 PHQ-9     13  Assessment: Patient currently experiencing see above   Patient may benefit from see above  Plan: 1. Follow up with behavioral health clinician on : 11/23/2020 at 9:00 AM  2. Behavioral recommendations:  3. Referral(s): [place] (In Clinic) 4. "From scale of 1-10, how likely are you to follow plan?":   Lesli Albee, Student-Social Work

## 2020-11-23 ENCOUNTER — Ambulatory Visit: Payer: Self-pay | Admitting: Licensed Clinical Social Worker

## 2020-11-23 ENCOUNTER — Other Ambulatory Visit: Payer: Self-pay

## 2020-11-23 DIAGNOSIS — F411 Generalized anxiety disorder: Secondary | ICD-10-CM

## 2020-11-23 NOTE — BH Specialist Note (Signed)
Integrated Behavioral Health Follow Up In-Person Visit  MRN: [id number] Name: Anthony Lam   Total time: 60 minutes  Types of Service: [place] (BHI)  Interpretor:No. Interpretor Name and Language:   Subjective: Anthony Lam is a 55 y.o. male accompanied by himself Patient was referred by Carlyon Shadow, NP for mental health. Patient reports the following symptoms/concerns: The patient reports that he is the same and hurts all over. He stated," I have decided to take things one day at time." The patient stated that he is not sleeping well. The patient states that he lays around all morning and his appetite is not good then, but he does walk  A short distance in his neighborhood about noon to 1:00 PM everyday, but can not make it far because of back pain. He reports that he has to sit down during his walks, but if he sits too long he can not get up because his knees lock up. The patient discussed his concerns regarding his memory and noted that he can read a newspaper in the morning and understand it but by that evening her can not recall what he read. The patient reported that his father had dementia and he is concerned he has it now as well. He stated that his son practices memory exercises with him like asking him, "Where did we go yesterday?" The patient discussed the advice and source of comfort his son provides him. The patient denied any suicidal or homicidal thoughts.  Duration of problem: ; Severity of problem: moderate  Objective: Mood: Euthymic and Affect: Appropriate Risk of harm to self or others: No plan to harm self or others  Life Context: Family and Social: see above School/Work: see above  Self-Care: see above Life Changes: see above  Patient and/or Family's Strengths/Protective Factors: Concrete supports in place (healthy food, safe environments, etc.)  Goals Addressed: Patient will: 1.  Reduce symptoms of: anxiety, depression and  mood instability  2.  Increase knowledge and/or ability of: coping skills, healthy habits and self-management skills  3.  Demonstrate ability to: Increase healthy adjustment to current life circumstances  Progress towards Goals: Ongoing  Interventions: Interventions utilized:  CBT Cognitive Behavioral Therapy was utilized by the clinician during today's follow up session. The clinician processed with the patient how they have been doing since the last follow-up session. The clinician provided a space for the patient to ventilate their frustrations regarding their current life circumstances. Clinician measured the patient's anxiety and depression on a numerical scale. The clinician encouraged the patient to utilize their coping skills to deal with their current life circumstances. The clinician discussed sleep hygiene and encouraged the patient to limit his caffeine in his diet and to try to wake up and go to sleep at the same time everyday. Clinician encouraged the patient to also discuss his memory and health concerns with his primary care provider during his next appointment. Clinician explained to the patient that [facility] has moved to it's new location and provided the patient with directions.  Standardized Assessments completed: GAD-7 and PHQ 9  GAD-7   17 PHQ-9   15  Assessment: Patient currently experiencing see above  Patient may benefit from see above  Plan: 1. Follow up with behavioral health clinician on : 12/07/2020 at 10:00 AM  2. Behavioral recommendations:  3. Referral(s): [place] (In Clinic) 4. "From scale of 1-10, how likely are you to follow plan?":   Lesli Albee,  Student-Social Work

## 2020-11-30 ENCOUNTER — Other Ambulatory Visit: Payer: Self-pay

## 2020-11-30 ENCOUNTER — Other Ambulatory Visit: Payer: Medicaid Other

## 2020-11-30 DIAGNOSIS — R7303 Prediabetes: Secondary | ICD-10-CM

## 2020-11-30 DIAGNOSIS — I10 Essential (primary) hypertension: Secondary | ICD-10-CM

## 2020-11-30 DIAGNOSIS — F419 Anxiety disorder, unspecified: Secondary | ICD-10-CM

## 2020-12-01 LAB — COMPREHENSIVE METABOLIC PANEL
ALT: 16 IU/L (ref 0–44)
AST: 19 IU/L (ref 0–40)
Albumin/Globulin Ratio: 1.3 (ref 1.2–2.2)
Albumin: 4.4 g/dL (ref 3.8–4.9)
Alkaline Phosphatase: 89 IU/L (ref 44–121)
BUN/Creatinine Ratio: 10 (ref 9–20)
BUN: 15 mg/dL (ref 6–24)
Bilirubin Total: 0.4 mg/dL (ref 0.0–1.2)
CO2: 22 mmol/L (ref 20–29)
Calcium: 9.6 mg/dL (ref 8.7–10.2)
Chloride: 103 mmol/L (ref 96–106)
Creatinine, Ser: 1.57 mg/dL — ABNORMAL HIGH (ref 0.76–1.27)
GFR calc Af Amer: 57 mL/min/{1.73_m2} — ABNORMAL LOW (ref >59)
GFR calc non Af Amer: 49 mL/min/{1.73_m2} — ABNORMAL LOW (ref >59)
Globulin, Total: 3.4 g/dL (ref 1.5–4.5)
Glucose: 92 mg/dL (ref 65–99)
Potassium: 4.3 mmol/L (ref 3.5–5.2)
Sodium: 139 mmol/L (ref 134–144)
Total Protein: 7.8 g/dL (ref 6.0–8.5)

## 2020-12-01 LAB — URINALYSIS
Bilirubin, UA: NEGATIVE
Glucose, UA: NEGATIVE
Ketones, UA: NEGATIVE
Leukocytes,UA: NEGATIVE
Nitrite, UA: NEGATIVE
RBC, UA: NEGATIVE
Specific Gravity, UA: 1.022 (ref 1.005–1.030)
Urobilinogen, Ur: 1 mg/dL (ref 0.2–1.0)
pH, UA: 5.5 (ref 5.0–7.5)

## 2020-12-01 LAB — VITAMIN D 25 HYDROXY (VIT D DEFICIENCY, FRACTURES): Vit D, 25-Hydroxy: 48.3 ng/mL (ref 30.0–100.0)

## 2020-12-01 LAB — TSH: TSH: 2.48 u[IU]/mL (ref 0.450–4.500)

## 2020-12-01 LAB — HEMOGLOBIN A1C
Est. average glucose Bld gHb Est-mCnc: 120 mg/dL
Hgb A1c MFr Bld: 5.8 % — ABNORMAL HIGH (ref 4.8–5.6)

## 2020-12-07 ENCOUNTER — Other Ambulatory Visit: Payer: Self-pay

## 2020-12-07 ENCOUNTER — Ambulatory Visit: Payer: Self-pay | Admitting: Licensed Clinical Social Worker

## 2020-12-07 ENCOUNTER — Ambulatory Visit: Payer: Medicaid Other | Admitting: Licensed Clinical Social Worker

## 2020-12-07 DIAGNOSIS — F339 Major depressive disorder, recurrent, unspecified: Secondary | ICD-10-CM

## 2020-12-07 DIAGNOSIS — F411 Generalized anxiety disorder: Secondary | ICD-10-CM

## 2020-12-07 NOTE — BH Specialist Note (Signed)
Integrated Behavioral Health Follow Up In-Person Visit  MRN: [id number] Name: Anthony Lam   Total time: 60 minutes  Types of Service: [place] (BHI)  Interpretor:No. Interpretor Name and Language:   Subjective: Anthony Lam is a 55 y.o. male accompanied by himself  Patient was referred by Carlyon Shadow, NP  for mental health Patient reports the following symptoms/concerns: The patient reports that he went to the doctor last week and that he has not heard anything from his blood work and that worries him. He states that he worries daily about his medication and different things. He states that his girlfriend has noticed that he has been more depressed and has been asking him if he was okay because he was just watching cars pass by through his window. He states that he use to have a car but lost it. He stated that was a hurtful event in his life. He discussed the stressors of filing for SSDI and reports that he was denied. He notes that his mom has been encouraging him to keep fighting. He notes that he wants to and go places and care for himself but it is very stressful because of his multiple doctor appointments and his health problems. He notes that his primary doctor can not fill out forms for disability, but the people at disability said that she can and without that paperwork he would not be approved. The patient explains that his attorney told him that his primary care provider has not been documenting his symptoms in enough detail to qualify.The patient discussed other financial and familial stressors. The patient stated that he was becoming anxious discussing his disability claim. The patient stated that he has to walk with a cane so he does not fall down when he is walking especially outside of his home. He discussed his love of sports and his frustrations of not being able play anymore. The patient denied any suicidal or homicidal thoughts.   Duration of  problem: ; Severity of problem: moderate  Objective: Mood: Euthymic and Affect: Appropriate Risk of harm to self or others: No plan to harm self or others  Life Context: Family and Social: see above School/Work: see above Self-Care: see above Life Changes: see above  Patient and/or Family's Strengths/Protective Factors: Concrete supports in place (healthy food, safe environments, etc.)  Goals Addressed: Patient will: 1.  Reduce symptoms of: agitation, anxiety, depression and stress  2.  Increase knowledge and/or ability of: coping skills, healthy habits, self-management skills and stress reduction  3.  Demonstrate ability to: Increase healthy adjustment to current life circumstances  Progress towards Goals: Ongoing  Interventions: Interventions utilized:  Supportive Counseling was utilized by the clinician during today's follow up session. The clinician processed with the patient how they have been doing since the last follow-up session. The clinician provided a space for the patient to ventilate their frustrations regarding their current life circumstances. Clinician measured the patient's anxiety and depression on a numerical scale. The clinician encouraged the patient to utilize their coping skills to deal with their current life circumstances. Clinician encouraged the patient to take their mediation at the same time everyday exactly as prescribed for it to reach it's full intended effect. Clinician encouraged the patient to self advocate for his needs with his primary care providers.  Standardized Assessments completed: GAD-7 and PHQ 9  GAD-7   14 PHQ-9   19  Assessment: Patient currently experiencing see above  Patient may benefit from see above.  Plan: 1. Follow up with behavioral health clinician on : 12/21/2020 at 9:00 AM  2. Behavioral recommendations:  3. Referral: [place] (In Clinic) 4. "From scale of 1-10, how likely are you to follow  plan?":   Lesli Albee, Student-Social Work

## 2020-12-14 ENCOUNTER — Other Ambulatory Visit: Payer: Self-pay | Admitting: Gerontology

## 2020-12-14 ENCOUNTER — Ambulatory Visit: Payer: Medicaid Other | Admitting: Gerontology

## 2020-12-14 ENCOUNTER — Encounter: Payer: Self-pay | Admitting: Gerontology

## 2020-12-14 VITALS — BP 146/91 | HR 85 | Resp 16 | Wt 288.1 lb

## 2020-12-14 DIAGNOSIS — R7303 Prediabetes: Secondary | ICD-10-CM

## 2020-12-14 DIAGNOSIS — N1831 Chronic kidney disease, stage 3a: Secondary | ICD-10-CM

## 2020-12-14 DIAGNOSIS — I10 Essential (primary) hypertension: Secondary | ICD-10-CM

## 2020-12-14 DIAGNOSIS — M545 Low back pain, unspecified: Secondary | ICD-10-CM

## 2020-12-14 DIAGNOSIS — G8929 Other chronic pain: Secondary | ICD-10-CM | POA: Insufficient documentation

## 2020-12-14 MED ORDER — LOSARTAN POTASSIUM 50 MG PO TABS: 50.0000 mg | ORAL_TABLET | Freq: Every day | ORAL | 0 refills | Status: DC

## 2020-12-14 MED ORDER — METFORMIN HCL ER (OSM) 500 MG PO TB24: 500.0000 mg | ORAL_TABLET | Freq: Every day | ORAL | 0 refills | Status: DC

## 2020-12-14 MED ORDER — LOSARTAN POTASSIUM 25 MG PO TABS: 25.0000 mg | ORAL_TABLET | Freq: Every day | ORAL | 0 refills | Status: DC

## 2020-12-14 NOTE — Progress Notes (Signed)
Established Patient Office Visit  Subjective:  Patient ID: Anthony Lam, male    DOB: [birth year]  Age: 55 y.o. MRN: [id number]  CC: No chief complaint on file.   HPI Anthony Lam presents for follow up of hypertension, Prediabetes and chronic back pain and lab review. His Serum creatinine decreased from 1.69 mg/dl to 1.57 mg/dl, eGFR increased from 52 to 57. He has 1+ proteinuria. His HgbA1c increased from 5.7% to 5.8%. He also c/o non traumatic constant non radiating throbbing  8/10 pain to his right knee and ankle that has been going on for many years. He denies any relieving factor and walking/ bending aggravates symptoms.  He states that he can't stand or sit for more than 10 minutes due to pain. He is seen at [facility] for his chronic back pain. He also follows up at [facility] with Dr Ardyth Man and his Losartan was increased to 75 mg daily. He reports been told by his Nephrologist Dr Ardyth Man to decreased his Metformin dose due to GI symptoms. He reports having 2-3 soft bowel movements. Overall, he states that he's doing well but concerned that his disability claim was denied because he can't work in a [employer].  Past Medical History:  Diagnosis Date  . Hypertension   . Migraines   . Seizures ([place])   . Sleep apnea   . TB (tuberculosis), treated 2009    Past Surgical History:  Procedure Laterality Date  . APPENDECTOMY    . COLONOSCOPY WITH PROPOFOL N/A 01/26/2020   Procedure: COLONOSCOPY WITH PROPOFOL;  Surgeon: Jonathon Bellows, MD;  Location[facility] ENDOSCOPY;  Service: Gastroenterology;  Laterality: N/A;    Family History  Problem Relation Age of Onset  . Hypertension Mother   . Hypertension Father     Social History   Socioeconomic History  . Marital status: Single    Spouse name: Not on file  . Number of children: 2  . Years of education: Not on file  . Highest education level: 12th grade  Occupational History  . Not on file  Tobacco Use  . Smoking  status: Light Tobacco Smoker    Packs/day: 0.25    Years: 15.00    Pack years: 3.75  . Smokeless tobacco: Never Used  . Tobacco comment: The only time he smokes is when he has a beer 1 cigarette  Vaping Use  . Vaping Use: Never used  Substance and Sexual Activity  . Alcohol use: Yes    Alcohol/week: 1.0 standard drink    Types: 1 Cans of beer per week    Comment: 1-2 beers a week  . Drug use: Never  . Sexual activity: Yes  Other Topics Concern  . Not on file  Social History Narrative  . Not on file   Social Determinants of Health   Financial Resource Strain: Not on file  Food Insecurity: Not on file  Transportation Needs: Not on file  Physical Activity: Not on file  Stress: Not on file  Social Connections: Not on file  Intimate Partner Violence: Not on file    Outpatient Medications Prior to Visit  Medication Sig Dispense Refill  . amLODipine (NORVASC) 10 MG tablet Take 1 tablet (10 mg total) by mouth daily. 90 tablet 1  . cetirizine (ZYRTEC) 10 MG tablet TAKE ONE TABLET BY MOUTH EVERY DAY 60 tablet 0  . fluticasone (FLONASE) 50 MCG/ACT nasal spray Place 1 spray into both nostrils daily.     Marland Kitchen gabapentin (NEURONTIN)  100 MG capsule Take 1 capsule (100 mg total) by mouth 3 (three) times daily. 90 capsule 1  . Vitamin D, Ergocalciferol, (DRISDOL) 1.25 MG (50000 UNIT) CAPS capsule Take 1 capsule (50,000 Units total) by mouth every 7 (seven) days. 16 capsule 0  . losartan (COZAAR) 50 MG tablet Take 1 tablet (50 mg total) by mouth daily. Pls provide 90 day supply 90 tablet 1  . metFORMIN (GLUCOPHAGE) 1000 MG tablet Take 0.5 tablets (500 mg total) by mouth daily with breakfast. (Patient not taking: Reported on 12/14/2020) 30 tablet 2   No facility-administered medications prior to visit.    No Known Allergies  ROS Review of Systems  Constitutional: Negative.   Eyes: Negative.   Respiratory: Negative.   Cardiovascular: Negative.   Endocrine: Negative.   Musculoskeletal:  Positive for arthralgias (right knee pain) and back pain (Chronic back pain).      Objective:    Physical Exam HENT:     Head: Normocephalic.  Eyes:     Extraocular Movements: Extraocular movements intact.     Conjunctiva/sclera: Conjunctivae normal.     Pupils: Pupils are equal, round, and reactive to light.  Cardiovascular:     Rate and Rhythm: Normal rate and regular rhythm.     Pulses: Normal pulses.     Heart sounds: Normal heart sounds.  Pulmonary:     Effort: Pulmonary effort is normal.     Breath sounds: Normal breath sounds.  Musculoskeletal:        General: Tenderness (palpation to right knee) present.     Comments: Ambulates with a cane  Neurological:     General: No focal deficit present.     Mental Status: He is alert and oriented to person, place, and time. Mental status is at baseline.  Psychiatric:        Mood and Affect: Mood normal.        Behavior: Behavior normal.        Thought Content: Thought content normal.        Judgment: Judgment normal.     BP (!) 146/91 (BP Location: Left Arm, Patient Position: Sitting, Cuff Size: Large)   Pulse 85   Resp 16   Wt 288 lb 1.6 oz (130.7 kg)   SpO2 97%   BMI 42.54 kg/m  Wt Readings from Last 3 Encounters:  12/14/20 288 lb 1.6 oz (130.7 kg)  11/30/20 285 lb (129.3 kg)  09/15/20 284 lb 6.4 oz (129 kg)   Encouraged weight loss  Health Maintenance Due  Topic Date Due  . Hepatitis C Screening  Never done  . COVID-19 Vaccine (1) Never done  . HIV Screening  Never done  . TETANUS/TDAP  Never done    There are no preventive care reminders to display for this patient.  Lab Results  Component Value Date   TSH 2.480 11/30/2020   Lab Results  Component Value Date   WBC WILL FOLLOW 02/17/2020   WBC 6.4 02/17/2020   HGB WILL FOLLOW 02/17/2020   HGB 14.9 02/17/2020   HCT WILL FOLLOW 02/17/2020   HCT 47.1 02/17/2020   MCV WILL FOLLOW 02/17/2020   MCV 82 02/17/2020   PLT WILL FOLLOW 02/17/2020   PLT 275  02/17/2020   Lab Results  Component Value Date   NA 140 12/14/2020   K 4.3 12/14/2020   CO2 21 12/14/2020   GLUCOSE 96 12/14/2020   BUN 15 12/14/2020   CREATININE 1.68 (H) 12/14/2020   BILITOT 0.4 11/30/2020  ALKPHOS 89 11/30/2020   AST 19 11/30/2020   ALT 16 11/30/2020   PROT 7.8 11/30/2020   ALBUMIN 4.7 12/14/2020   CALCIUM 9.6 12/14/2020   ANIONGAP 6 (L) 08/04/2013   Lab Results  Component Value Date   CHOL 162 06/03/2019   Lab Results  Component Value Date   HDL 44 06/03/2019   Lab Results  Component Value Date   LDLCALC 97 06/03/2019   Lab Results  Component Value Date   TRIG 104 06/03/2019   Lab Results  Component Value Date   CHOLHDL 3.7 06/03/2019   Lab Results  Component Value Date   HGBA1C 5.8 (H) 11/30/2020      Assessment & Plan:    1. Essential hypertension - His blood pressure is not under control, his goal should be less than 130/80. He will continue on 75 mg Losartan, DASH diet and exercise as tolerated. - losartan (COZAAR) 50 MG tablet; Take 1 tablet (50 mg total) by mouth daily. Pls provide 90 day supply  Dispense: 90 tablet; Refill: 0 - losartan (COZAAR) 25 MG tablet; Take 1 tablet (25 mg total) by mouth daily.  Dispense: 90 tablet; Refill: 0  2. Stage 3a chronic kidney disease ([place]) - Lab requested by Nephrologist, he will follow up with Dr Ardyth Man on 12/30/20. - Renal Function Panel; Future - PTH, intact (no Ca); Future - Vitamin D (25 hydroxy); Future - Vitamin D (25 hydroxy) - PTH, intact (no Ca) - Renal Function Panel  3. Prediabetes - His Metformin was changed to 500 mg ER, he was advised to notify clinic with GI symptoms. - metformin (FORTAMET) 500 MG (OSM) 24 hr tablet; Take 1 tablet (500 mg total) by mouth daily with breakfast.  Dispense: 30 tablet; Refill: 0  4. Chronic low back pain, unspecified back pain laterality, unspecified whether sciatica present - He was advised to continue PT with [facility] hospital  5. Chronic pain  of right knee - He will apply lidocaine to right knee and follow up with [facility] Orthopedic Surgeon Dr Vickki Hearing. - lidocaine (LIDODERM) 5 %; Place 1 patch onto the skin daily. Remove & Discard patch within 12 hours or as directed by MD  Dispense: 30 patch; Refill: 0    Follow-up: Return in about 4 weeks (around 01/11/2021), or if symptoms worsen or fail to improve.    Aza Dantes Jerold Coombe, NP

## 2020-12-14 NOTE — Patient Instructions (Addendum)
https://www.nhlbi.nih.gov/files/docs/public/heart/dash_brief.pdf">  DASH Eating Plan DASH stands for Dietary Approaches to Stop Hypertension. The DASH eating plan is a healthy eating plan that has been shown to:  Reduce high blood pressure (hypertension).  Reduce your risk for type 2 diabetes, heart disease, and stroke.  Help with weight loss. What are tips for following this plan? Reading food labels  Check food labels for the amount of salt (sodium) per serving. Choose foods with less than 5 percent of the Daily Value of sodium. Generally, foods with less than 300 milligrams (mg) of sodium per serving fit into this eating plan.  To find whole grains, look for the word "whole" as the first word in the ingredient list. Shopping  Buy products labeled as "low-sodium" or "no salt added."  Buy fresh foods. Avoid canned foods and pre-made or frozen meals. Cooking  Avoid adding salt when cooking. Use salt-free seasonings or herbs instead of table salt or sea salt. Check with your health care provider or pharmacist before using salt substitutes.  Do not fry foods. Cook foods using healthy methods such as baking, boiling, grilling, roasting, and broiling instead.  Cook with heart-healthy oils, such as olive, canola, avocado, soybean, or sunflower oil. Meal planning  Eat a balanced diet that includes: ? 4 or more servings of fruits and 4 or more servings of vegetables each day. Try to fill one-half of your plate with fruits and vegetables. ? 6-8 servings of whole grains each day. ? Less than 6 oz (170 g) of lean meat, poultry, or fish each day. A 3-oz (85-g) serving of meat is about the same size as a deck of cards. One egg equals 1 oz (28 g). ? 2-3 servings of low-fat dairy each day. One serving is 1 cup (237 mL). ? 1 serving of nuts, seeds, or beans 5 times each week. ? 2-3 servings of heart-healthy fats. Healthy fats called omega-3 fatty acids are found in foods such as walnuts,  flaxseeds, fortified milks, and eggs. These fats are also found in cold-water fish, such as sardines, salmon, and mackerel.  Limit how much you eat of: ? Canned or prepackaged foods. ? Food that is high in trans fat, such as some fried foods. ? Food that is high in saturated fat, such as fatty meat. ? Desserts and other sweets, sugary drinks, and other foods with added sugar. ? Full-fat dairy products.  Do not salt foods before eating.  Do not eat more than 4 egg yolks a week.  Try to eat at least 2 vegetarian meals a week.  Eat more home-cooked food and less restaurant, buffet, and fast food.   Lifestyle  When eating at a restaurant, ask that your food be prepared with less salt or no salt, if possible.  If you drink alcohol: ? Limit how much you use to:  0-1 drink a day for women who are not pregnant.  0-2 drinks a day for men. ? Be aware of how much alcohol is in your drink. In the [place] one drink equals one 12 oz bottle of beer (355 mL), one 5 oz glass of wine (148 mL), or one 1 oz glass of hard liquor (44 mL). General information  Avoid eating more than 2,300 mg of salt a day. If you have hypertension, you may need to reduce your sodium intake to 1,500 mg a day.  Work with your health care provider to maintain a healthy body weight or to lose weight. Ask what an ideal weight is for   you.  Get at least 30 minutes of exercise that causes your heart to beat faster (aerobic exercise) most days of the week. Activities may include walking, swimming, or biking.  Work with your health care provider or dietitian to adjust your eating plan to your individual calorie needs. What foods should I eat? Fruits All fresh, dried, or frozen fruit. Canned fruit in natural juice (without added sugar). Vegetables Fresh or frozen vegetables (raw, steamed, roasted, or grilled). Low-sodium or reduced-sodium tomato and vegetable juice. Low-sodium or reduced-sodium tomato sauce and tomato paste.  Low-sodium or reduced-sodium canned vegetables. Grains Whole-grain or whole-wheat bread. Whole-grain or whole-wheat pasta. Brown rice. Oatmeal. Quinoa. Bulgur. Whole-grain and low-sodium cereals. Pita bread. Low-fat, low-sodium crackers. Whole-wheat flour tortillas. Meats and other proteins Skinless chicken or turkey. Ground chicken or turkey. Pork with fat trimmed off. Fish and seafood. Egg whites. Dried beans, peas, or lentils. Unsalted nuts, nut butters, and seeds. Unsalted canned beans. Lean cuts of beef with fat trimmed off. Low-sodium, lean precooked or cured meat, such as sausages or meat loaves. Dairy Low-fat (1%) or fat-free (skim) milk. Reduced-fat, low-fat, or fat-free cheeses. Nonfat, low-sodium ricotta or cottage cheese. Low-fat or nonfat yogurt. Low-fat, low-sodium cheese. Fats and oils Soft margarine without trans fats. Vegetable oil. Reduced-fat, low-fat, or light mayonnaise and salad dressings (reduced-sodium). Canola, safflower, olive, avocado, soybean, and sunflower oils. Avocado. Seasonings and condiments Herbs. Spices. Seasoning mixes without salt. Other foods Unsalted popcorn and pretzels. Fat-free sweets. The items listed above may not be a complete list of foods and beverages you can eat. Contact a dietitian for more information. What foods should I avoid? Fruits Canned fruit in a light or heavy syrup. Fried fruit. Fruit in cream or butter sauce. Vegetables Creamed or fried vegetables. Vegetables in a cheese sauce. Regular canned vegetables (not low-sodium or reduced-sodium). Regular canned tomato sauce and paste (not low-sodium or reduced-sodium). Regular tomato and vegetable juice (not low-sodium or reduced-sodium). Pickles. Olives. Grains Baked goods made with fat, such as croissants, muffins, or some breads. Dry pasta or rice meal packs. Meats and other proteins Fatty cuts of meat. Ribs. Fried meat. Bacon. Bologna, salami, and other precooked or cured meats, such as  sausages or meat loaves. Fat from the back of a pig (fatback). Bratwurst. Salted nuts and seeds. Canned beans with added salt. Canned or smoked fish. Whole eggs or egg yolks. Chicken or turkey with skin. Dairy Whole or 2% milk, cream, and half-and-half. Whole or full-fat cream cheese. Whole-fat or sweetened yogurt. Full-fat cheese. Nondairy creamers. Whipped toppings. Processed cheese and cheese spreads. Fats and oils Butter. Stick margarine. Lard. Shortening. Ghee. Bacon fat. Tropical oils, such as coconut, palm kernel, or palm oil. Seasonings and condiments Onion salt, garlic salt, seasoned salt, table salt, and sea salt. Worcestershire sauce. Tartar sauce. Barbecue sauce. Teriyaki sauce. Soy sauce, including reduced-sodium. Steak sauce. Canned and packaged gravies. Fish sauce. Oyster sauce. Cocktail sauce. Store-bought horseradish. Ketchup. Mustard. Meat flavorings and tenderizers. Bouillon cubes. Hot sauces. Pre-made or packaged marinades. Pre-made or packaged taco seasonings. Relishes. Regular salad dressings. Other foods Salted popcorn and pretzels. The items listed above may not be a complete list of foods and beverages you should avoid. Contact a dietitian for more information. Where to find more information  National Heart, Lung, and Blood Institute: www.nhlbi.nih.gov  American Heart Association: www.heart.org  Academy of Nutrition and Dietetics: www.eatright.org  National Kidney Foundation: www.kidney.org Summary  The DASH eating plan is a healthy eating plan that has been shown to   reduce high blood pressure (hypertension). It may also reduce your risk for type 2 diabetes, heart disease, and stroke.  When on the DASH eating plan, aim to eat more fresh fruits and vegetables, whole grains, lean proteins, low-fat dairy, and heart-healthy fats.  With the DASH eating plan, you should limit salt (sodium) intake to 2,300 mg a day. If you have hypertension, you may need to reduce your  sodium intake to 1,500 mg a day.  Work with your health care provider or dietitian to adjust your eating plan to your individual calorie needs. This information is not intended to replace advice given to you by your health care provider. Make sure you discuss any questions you have with your health care provider. Document Revised: 09/04/2019 Document Reviewed: 09/04/2019 Elsevier Patient Education  2021 [place].  Prediabetes Eating Plan Prediabetes is a condition that causes blood sugar (glucose) levels to be higher than normal. This increases the risk for developing type 2 diabetes (type 2 diabetes mellitus). Working with a health care provider or nutrition specialist (dietitian) to make diet and lifestyle changes can help prevent the onset of diabetes. These changes may help you:  Control your blood glucose levels.  Improve your cholesterol levels.  Manage your blood pressure. What are tips for following this plan? Reading food labels  Read food labels to check the amount of fat, salt (sodium), and sugar in prepackaged foods. Avoid foods that have: ? Saturated fats. ? Trans fats. ? Added sugars.  Avoid foods that have more than 300 milligrams (mg) of sodium per serving. Limit your sodium intake to less than 2,300 mg each day. Shopping  Avoid buying pre-made and processed foods.  Avoid buying drinks with added sugar. Cooking  Cook with olive oil. Do not use butter, lard, or ghee.  Bake, broil, grill, steam, or boil foods. Avoid frying. Meal planning  Work with your dietitian to create an eating plan that is right for you. This may include tracking how many calories you take in each day. Use a food diary, notebook, or mobile application to track what you eat at each meal.  Consider following a Mediterranean diet. This includes: ? Eating several servings of fresh fruits and vegetables each day. ? Eating fish at least twice a week. ? Eating one serving each day of whole  grains, beans, nuts, and seeds. ? Using olive oil instead of other fats. ? Limiting alcohol. ? Limiting red meat. ? Using nonfat or low-fat dairy products.  Consider following a plant-based diet. This includes dietary choices that focus on eating mostly vegetables and fruit, grains, beans, nuts, and seeds.  If you have high blood pressure, you may need to limit your sodium intake or follow a diet such as the DASH (Dietary Approaches to Stop Hypertension) eating plan. The DASH diet aims to lower high blood pressure.   Lifestyle  Set weight loss goals with help from your health care team. It is recommended that most people with prediabetes lose 7% of their body weight.  Exercise for at least 30 minutes 5 or more days a week.  Attend a support group or seek support from a mental health counselor.  Take over-the-counter and prescription medicines only as told by your health care provider. What foods are recommended? Fruits Berries. Bananas. Apples. Oranges. Grapes. Papaya. Mango. Pomegranate. Kiwi. Grapefruit. Cherries. Vegetables Lettuce. Spinach. Peas. Beets. Cauliflower. Cabbage. Broccoli. Carrots. Tomatoes. Squash. Eggplant. Herbs. Peppers. Onions. Cucumbers. Brussels sprouts. Grains Whole grains, such as whole-wheat or whole-grain breads,  crackers, cereals, and pasta. Unsweetened oatmeal. Bulgur. Barley. Quinoa. Brown rice. Corn or whole-wheat flour tortillas or taco shells. Meats and other proteins Seafood. Poultry without skin. Lean cuts of pork and beef. Tofu. Eggs. Nuts. Beans. Dairy Low-fat or fat-free dairy products, such as yogurt, cottage cheese, and cheese. Beverages Water. Tea. Coffee. Sugar-free or diet soda. Seltzer water. Low-fat or nonfat milk. Milk alternatives, such as soy or almond milk. Fats and oils Olive oil. Canola oil. Sunflower oil. Grapeseed oil. Avocado. Walnuts. Sweets and desserts Sugar-free or low-fat pudding. Sugar-free or low-fat ice cream and other  frozen treats. Seasonings and condiments Herbs. Sodium-free spices. Mustard. Relish. Low-salt, low-sugar ketchup. Low-salt, low-sugar barbecue sauce. Low-fat or fat-free mayonnaise. The items listed above may not be a complete list of recommended foods and beverages. Contact a dietitian for more information. What foods are not recommended? Fruits Fruits canned with syrup. Vegetables Canned vegetables. Frozen vegetables with butter or cream sauce. Grains Refined white flour and flour products, such as bread, pasta, snack foods, and cereals. Meats and other proteins Fatty cuts of meat. Poultry with skin. Breaded or fried meat. Processed meats. Dairy Full-fat yogurt, cheese, or milk. Beverages Sweetened drinks, such as iced tea and soda. Fats and oils Butter. Lard. Ghee. Sweets and desserts Baked goods, such as cake, cupcakes, pastries, cookies, and cheesecake. Seasonings and condiments Spice mixes with added salt. Ketchup. Barbecue sauce. Mayonnaise. The items listed above may not be a complete list of foods and beverages that are not recommended. Contact a dietitian for more information. Where to find more information  American Diabetes Association: www.diabetes.org Summary  You may need to make diet and lifestyle changes to help prevent the onset of diabetes. These changes can help you control blood sugar, improve cholesterol levels, and manage blood pressure.  Set weight loss goals with help from your health care team. It is recommended that most people with prediabetes lose 7% of their body weight.  Consider following a Mediterranean diet. This includes eating plenty of fresh fruits and vegetables, whole grains, beans, nuts, seeds, fish, and low-fat dairy, and using olive oil instead of other fats. This information is not intended to replace advice given to you by your health care provider. Make sure you discuss any questions you have with your health care provider. Document  Revised: 12/31/2019 Document Reviewed: 12/31/2019 Elsevier Patient Education  [place].

## 2020-12-15 ENCOUNTER — Other Ambulatory Visit: Payer: Self-pay | Admitting: Gerontology

## 2020-12-15 LAB — RENAL FUNCTION PANEL
Albumin: 4.7 g/dL (ref 3.8–4.9)
BUN/Creatinine Ratio: 9 (ref 9–20)
BUN: 15 mg/dL (ref 6–24)
CO2: 21 mmol/L (ref 20–29)
Calcium: 9.6 mg/dL (ref 8.7–10.2)
Chloride: 102 mmol/L (ref 96–106)
Creatinine, Ser: 1.68 mg/dL — ABNORMAL HIGH (ref 0.76–1.27)
Glucose: 96 mg/dL (ref 65–99)
Phosphorus: 3 mg/dL (ref 2.8–4.1)
Potassium: 4.3 mmol/L (ref 3.5–5.2)
Sodium: 140 mmol/L (ref 134–144)
eGFR: 48 mL/min/{1.73_m2} — ABNORMAL LOW (ref >59)

## 2020-12-15 LAB — VITAMIN D 25 HYDROXY (VIT D DEFICIENCY, FRACTURES): Vit D, 25-Hydroxy: 47.3 ng/mL (ref 30.0–100.0)

## 2020-12-15 LAB — PARATHYROID HORMONE, INTACT (NO CA)

## 2020-12-15 MED ORDER — LIDOCAINE 5 % EX PTCH
1.0000 | MEDICATED_PATCH | CUTANEOUS | 0 refills | Status: DC
Start: 2020-12-15 — End: 2020-12-15

## 2020-12-21 ENCOUNTER — Other Ambulatory Visit: Payer: Self-pay

## 2020-12-21 ENCOUNTER — Ambulatory Visit: Payer: Self-pay | Admitting: Licensed Clinical Social Worker

## 2020-12-21 ENCOUNTER — Ambulatory Visit: Payer: Medicaid Other | Admitting: Licensed Clinical Social Worker

## 2020-12-21 DIAGNOSIS — F411 Generalized anxiety disorder: Secondary | ICD-10-CM

## 2020-12-21 DIAGNOSIS — F339 Major depressive disorder, recurrent, unspecified: Secondary | ICD-10-CM

## 2020-12-21 NOTE — BH Specialist Note (Signed)
Integrated Behavioral Health Follow Up In-Person Visit  MRN: [id number] Name: Anthony Lam   Total time: 60 minutes  Types of Service: [place] ([facility])  Interpretor:No. Interpretor Name and Language:   Subjective: Anthony Lam is a 55 y.o. male accompanied by himself Patient was referred by Carlyon Shadow for mental health. Patient reports the following symptoms/concerns: The patient reports that due to the bad weather last night he had a hard time sleeping. He notes that his back pain is worse in stormy weather and he could not rest, and is feeling very groggy today. He stated he tries to watch the news and an hour later he forgets what he watched. He explained that if he reads a book or an newspaper article he forgets what he read by that afternoon. He stated he is concerned that his anxiety and depression may be impacting his memory. He notes that his girlfriend and others tell him about conversations he has with them that he can not recall. He feels that he has had memory problems for six  years but feels medication side effects such as drowsiness is compounding the problem. He notes that he is trying to keep on going, and has his girlfriend to write everything down for him so he can follow a schedule to remind him when to take his medications and when to eat. He discussed health and financial stressors. He stated that his mom and sisters try to support him emotionally but he needs help paying his bills and stuff and does not want to depend on other people. The patient discussed that he eats snacks late at night when he starts worrying about his problem, and his doctor told him he gained more weight since his last visit. The patient reports that he enjoys sitting on the porch and visiting with his neighbors and friends. Duration of problem: ; Severity of problem: moderate  Objective: Mood: Euthymic and Affect: Appropriate Risk of harm to self or others: No plan  to harm self or others  Life Context: Family and Social: see above School/Work: see above Self-Care: see above Life Changes: see above  Patient and/or Family's Strengths/Protective Factors: Concrete supports in place (healthy food, safe environments, etc.)  Goals Addressed: Patient will: 1.  Reduce symptoms of: anxiety, depression and stress  2.  Increase knowledge and/or ability of: coping skills, healthy habits, self-management skills and stress reduction  3.  Demonstrate ability to: Increase healthy adjustment to current life circumstances  Progress towards Goals: Ongoing  Interventions: Interventions utilized:  CBT Cognitive Behavioral Therapy was utilized by the clinician during today's follow up session. The clinician processed with the patient how they have been doing since the last follow-up session. The clinician provided a space for the patient to ventilate their frustrations regarding their current life circumstances. Clinician measured the patient's anxiety and depression on a numerical scale.  Clinician encouraged the client to focus on what is in his control verses what is not in his control. Clinician provided psychoeducational support regarding how thoughts, emotions, and behaviors affect one another and by changing irrational thoughts one can improve mood and behaviors. Clinician encouraged the patient to speak with his primary care provider regarding his memory concerns. Clinician suggested the patient begin a journal to help him feel more independent with his day and his schedule. Standardized Assessments completed: GAD-7 and PHQ 9  GAD-7    14 PHQ-9    15  Assessment: Patient currently experiencing see above  Patient may  benefit from see above  Plan: 1. Follow up with behavioral health clinician on : 01/17/2021 at 10:00 AM  2. Behavioral recommendations:  3. Referral(s): [employer] (In Clinic) 4. "From scale of 1-10, how likely are you  to follow plan?":   Lesli Albee, Student-Social Work

## 2020-12-27 ENCOUNTER — Other Ambulatory Visit: Payer: Self-pay

## 2020-12-27 ENCOUNTER — Ambulatory Visit: Payer: Medicaid Other | Admitting: Specialist

## 2020-12-27 DIAGNOSIS — M545 Low back pain, unspecified: Secondary | ICD-10-CM

## 2020-12-27 DIAGNOSIS — G8929 Other chronic pain: Secondary | ICD-10-CM

## 2020-12-27 NOTE — Progress Notes (Signed)
  Subjective:     Patient ID: Anthony Lam, male   DOB: 08-Nov-1965, 55 y.o.   MRN: [id number]  HPI 55 y/o with chronic LBP. He has bene treated at [facility] in the past with ESI s which relieve his leg pain but for less than 24 hours  It appears that he is interested in obtaining disability; we do not get involved in medical legal claims because of clinic rules.   Review of Systems     Objective:   Physical Exam Walks with a cane. Holds it with his right hand because his right knee hurts worse than left. He has extreme difficulty getting out of a chair with verbal complaints of pain. Even with the cane he walks with a marked antalgic gait  He is unable to get up onto examining table.   Light palpation patient he has diminished sensation in L 5 distribution    DTR's at the knees and ankles are 2+ and symmetrical. Positive [place] signs.    Assessment:     1. Chronic LBP 2. Significant pain behavior exhibited.     Plan:     We have nothing to offer. He has been treated at [facility] and contines to see therapy there.

## 2021-01-11 ENCOUNTER — Other Ambulatory Visit: Payer: Self-pay | Admitting: Gerontology

## 2021-01-11 ENCOUNTER — Other Ambulatory Visit: Payer: Self-pay

## 2021-01-11 ENCOUNTER — Ambulatory Visit: Payer: Medicaid Other | Admitting: Gerontology

## 2021-01-11 VITALS — BP 123/86 | HR 78 | Temp 96.4°F | Resp 16 | Wt 284.1 lb

## 2021-01-11 DIAGNOSIS — I10 Essential (primary) hypertension: Secondary | ICD-10-CM

## 2021-01-11 DIAGNOSIS — R0981 Nasal congestion: Secondary | ICD-10-CM

## 2021-01-11 DIAGNOSIS — R7303 Prediabetes: Secondary | ICD-10-CM

## 2021-01-11 MED ORDER — LOSARTAN POTASSIUM 25 MG PO TABS: 25.0000 mg | ORAL_TABLET | Freq: Every day | ORAL | 2 refills | Status: DC

## 2021-01-11 MED ORDER — AMLODIPINE BESYLATE 10 MG PO TABS
10.0000 mg | ORAL_TABLET | Freq: Every day | ORAL | 1 refills | Status: DC
Start: 2021-01-11 — End: 2021-01-11

## 2021-01-11 MED ORDER — CETIRIZINE HCL 10 MG PO TABS: 10.0000 mg | ORAL_TABLET | Freq: Every day | ORAL | 0 refills | Status: DC

## 2021-01-11 MED ORDER — LOSARTAN POTASSIUM 25 MG PO TABS: 25.0000 mg | ORAL_TABLET | Freq: Every day | ORAL | 0 refills | Status: DC

## 2021-01-11 NOTE — Progress Notes (Signed)
Established Patient Office Visit  Subjective:  Patient ID: Anthony Lam, male    DOB: [birth year]  Age: 55 y.o. MRN: [id number]  CC: No chief complaint on file.   HPI Anthony Lam presents for follow up of hypertension and prediabetes. He states that he's compliant with his medications, but self discontinued Metformin due to GI concerns. His HgbA1c done on 11/30/20 was 5.8% and he states that he's modifying his diet. His Serum creatinine was 1.57 mg/dl, eGFR was 57. He continues to follow up with Nephrologist Dr Ardyth Man at [facility]. His blood pressure is under control, he checks his blood pressure weekly and states that it's usually less than 140/90. Overall, he states that he's doing well and offers no further complaint.  Past Medical History:  Diagnosis Date  . Hypertension   . Migraines   . Seizures ([place])   . Sleep apnea   . TB (tuberculosis), treated 2009    Past Surgical History:  Procedure Laterality Date  . APPENDECTOMY    . COLONOSCOPY WITH PROPOFOL N/A 01/26/2020   Procedure: COLONOSCOPY WITH PROPOFOL;  Surgeon: Jonathon Bellows, MD;  Location: [facility] ENDOSCOPY;  Service: Gastroenterology;  Laterality: N/A;    Family History  Problem Relation Age of Onset  . Hypertension Mother   . Hypertension Father     Social History   Socioeconomic History  . Marital status: Single    Spouse name: Not on file  . Number of children: 2  . Years of education: Not on file  . Highest education level: 12th grade  Occupational History  . Not on file  Tobacco Use  . Smoking status: Light Tobacco Smoker    Packs/day: 0.25    Years: 15.00    Pack years: 3.75  . Smokeless tobacco: Never Used  . Tobacco comment: The only time he smokes is when he has a beer 1 cigarette  Vaping Use  . Vaping Use: Never used  Substance and Sexual Activity  . Alcohol use: Yes    Alcohol/week: 1.0 standard drink    Types: 1 Cans of beer per week    Comment: 1-2 beers a week  . Drug use: Never  . Sexual  activity: Yes  Other Topics Concern  . Not on file  Social History Narrative  . Not on file   Social Determinants of Health   Financial Resource Strain: Not on file  Food Insecurity: Not on file  Transportation Needs: Not on file  Physical Activity: Not on file  Stress: Not on file  Social Connections: Not on file  Intimate Partner Violence: Not on file    Outpatient Medications Prior to Visit  Medication Sig Dispense Refill  . fluticasone (FLONASE) 50 MCG/ACT nasal spray Place 1 spray into both nostrils daily.     . Vitamin D, Ergocalciferol, (DRISDOL) 1.25 MG (50000 UNIT) CAPS capsule Take 1 capsule (50,000 Units total) by mouth every 7 (seven) days. 16 capsule 0  . amLODipine (NORVASC) 10 MG tablet Take 1 tablet (10 mg total) by mouth daily. 90 tablet 1  . cetirizine (ZYRTEC) 10 MG tablet TAKE ONE TABLET BY MOUTH EVERY DAY 60 tablet 0  . gabapentin (NEURONTIN) 100 MG capsule Take 1 capsule (100 mg total) by mouth 3 (three) times daily. 90 capsule 1  . lidocaine (LIDODERM) 5 % Place 1 patch onto the skin daily. Remove & Discard patch within 12 hours or as directed by MD 30 patch 0  . losartan (COZAAR) 50 MG tablet Take 1  tablet (50 mg total) by mouth daily. Pls provide 90 day supply 90 tablet 0  . losartan (COZAAR) 25 MG tablet Take 1 tablet (25 mg total) by mouth daily. (Patient not taking: Reported on 01/11/2021) 90 tablet 0  . metformin (FORTAMET) 500 MG (OSM) 24 hr tablet Take 1 tablet (500 mg total) by mouth daily with breakfast. (Patient not taking: Reported on 01/11/2021) 30 tablet 0   No facility-administered medications prior to visit.    No Known Allergies  ROS Review of Systems  Constitutional: Negative.   Eyes: Negative.   Respiratory: Negative.   Cardiovascular: Negative.   Endocrine: Negative.   Skin: Negative.   Neurological: Negative.   Psychiatric/Behavioral: Negative.       Objective:    Physical Exam HENT:     Head: Normocephalic and atraumatic.   Cardiovascular:     Rate and Rhythm: Normal rate and regular rhythm.     Pulses: Normal pulses.     Heart sounds: Normal heart sounds.  Pulmonary:     Effort: Pulmonary effort is normal.     Breath sounds: Normal breath sounds.  Skin:    General: Skin is warm.  Neurological:     General: No focal deficit present.     Mental Status: He is alert and oriented to person, place, and time. Mental status is at baseline.  Psychiatric:        Mood and Affect: Mood normal.        Behavior: Behavior normal.        Thought Content: Thought content normal.        Judgment: Judgment normal.     BP 123/86 (BP Location: Right Arm, Patient Position: Sitting, Cuff Size: Large)   Pulse 78   Temp (!) 96.4 F (35.8 C)   Resp 16   Wt 284 lb 1.6 oz (128.9 kg)   SpO2 91%   BMI 41.95 kg/m  Wt Readings from Last 3 Encounters:  01/11/21 284 lb 1.6 oz (128.9 kg)  12/14/20 288 lb 1.6 oz (130.7 kg)  11/30/20 285 lb (129.3 kg)   Weight loss encouraged  Health Maintenance Due  Topic Date Due  . Hepatitis C Screening  Never done  . COVID-19 Vaccine (1) Never done  . HIV Screening  Never done  . TETANUS/TDAP  Never done    There are no preventive care reminders to display for this patient.  Lab Results  Component Value Date   TSH 2.480 11/30/2020   Lab Results  Component Value Date   WBC WILL FOLLOW 02/17/2020   WBC 6.4 02/17/2020   HGB WILL FOLLOW 02/17/2020   HGB 14.9 02/17/2020   HCT WILL FOLLOW 02/17/2020   HCT 47.1 02/17/2020   MCV WILL FOLLOW 02/17/2020   MCV 82 02/17/2020   PLT WILL FOLLOW 02/17/2020   PLT 275 02/17/2020   Lab Results  Component Value Date   NA 140 12/14/2020   K 4.3 12/14/2020   CO2 21 12/14/2020   GLUCOSE 96 12/14/2020   BUN 15 12/14/2020   CREATININE 1.68 (H) 12/14/2020   BILITOT 0.4 11/30/2020   ALKPHOS 89 11/30/2020   AST 19 11/30/2020   ALT 16 11/30/2020   PROT 7.8 11/30/2020   ALBUMIN 4.7 12/14/2020   CALCIUM 9.6 12/14/2020   ANIONGAP 6 (L)  08/04/2013   Lab Results  Component Value Date   CHOL 162 06/03/2019   Lab Results  Component Value Date   HDL 44 06/03/2019   Lab Results  Component Value Date   LDLCALC 97 06/03/2019   Lab Results  Component Value Date   TRIG 104 06/03/2019   Lab Results  Component Value Date   CHOLHDL 3.7 06/03/2019   Lab Results  Component Value Date   HGBA1C 5.8 (H) 11/30/2020      Assessment & Plan:    1. Essential hypertension - His blood pressure is under control, his goal should be less than 130/80. He will continue on current treatment regimen, DASH diet and exercises as tolerated.  2. Prediabetes - His HgbA1c was 5.8%, he self discontinued his Metformin, was advised to continue on low carb/non concentrated sweet diet and exercise as tolerated. - HgB A1c; Future     Follow-up: Return in about 13 weeks (around 04/12/2021), or if symptoms worsen or fail to improve.    Takeesha Isley Jerold Coombe, NP

## 2021-01-11 NOTE — Patient Instructions (Signed)

## 2021-01-17 ENCOUNTER — Ambulatory Visit: Payer: Self-pay | Admitting: Licensed Clinical Social Worker

## 2021-01-17 ENCOUNTER — Ambulatory Visit: Payer: Medicaid Other | Admitting: Licensed Clinical Social Worker

## 2021-01-17 ENCOUNTER — Other Ambulatory Visit: Payer: Self-pay

## 2021-01-17 DIAGNOSIS — F411 Generalized anxiety disorder: Secondary | ICD-10-CM

## 2021-01-17 DIAGNOSIS — F339 Major depressive disorder, recurrent, unspecified: Secondary | ICD-10-CM

## 2021-01-17 NOTE — BH Specialist Note (Signed)
Integrated Behavioral Health Follow Up In-Person Visit  MRN: [id number] Name: Anthony Lam   Total time: 60 minutes  Types of Service: [place] ([facility])  Interpretor:No. Interpretor Name and Language:   Patient consents to telephone visit and 2 patient identifiers were used to identify patient  Subjective: KOEN ANTILLA is a 55 y.o. male accompanied by himself Patient was referred by Carlyon Shadow, NP  for mental health care. Patient reports the following symptoms/concerns: The patient stated that he has been doing well since his last follow-up session. He discussed that his sleep is sometimes interrupted by his back pain, and that he sometimes needs to prop himself up in his recliner to find a comfortable position to rest. He discussed health stressors that impact his mobility and lifestyle. He notes that he is worrying a lot about  things such as stray dogs biting him on walks or being robbed because he looks vulnerable walking with a cane. He notes he  worries that cars may run him over especially at stop lights and sometimes he feels like that are moving closer to him. The patient discussed he feels better when his son walks with him. He notes that he has a very good relationship with his children and his mother. He stated he is grateful his girlfriend helps him out too. The patient denied any suicidal or homicidal thoughts. Duration of problem: ; Severity of problem: moderate  Objective: Mood: Euthymic and Affect: Appropriate Risk of harm to self or others: No plan to harm self or others  Life Context: Family and Social: see above School/Work: see above Self-Care: see above Life Changes: see above  Patient and/or Family's Strengths/Protective Factors: Concrete supports in place (healthy food, safe environments, etc.)  Goals Addressed: Patient will: 1.  Reduce symptoms of: anxiety, depression and stress  2.  Increase knowledge and/or ability of:  coping skills, healthy habits and stress reduction  3.  Demonstrate ability to: Increase healthy adjustment to current life circumstances  Progress towards Goals: Ongoing  Interventions: Interventions utilized:  CBT Cognitive Behavioral Therapy was utilized by the clinician during today's follow up session. The clinician processed with the patient how they have been doing since the last follow-up session. The clinician provided a space for the patient to ventilate their frustrations regarding their current life circumstances. Clinician measured the patient's anxiety and depression on a numerical scale. 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 Clinician explored with the patient ways to incorporate relaxation techniques into his walks when he recognizes his anxiety increasing.  Standardized Assessments completed: GAD-7 and PHQ 9  GAD-7     15 PHQ-9     13  Assessment: Patient currently experiencing see above  Patient may benefit from see above  Plan: 1. Follow up with behavioral health clinician on : 02/07/21 at 10:00 PM  2. Behavioral recommendations:  3. Referral(s): [place] (In Clinic) 4. "From scale of 1-10, how likely are you to follow plan?":   Lesli Albee, Student-Social Work

## 2021-01-23 ENCOUNTER — Other Ambulatory Visit: Payer: Self-pay

## 2021-01-23 ENCOUNTER — Other Ambulatory Visit: Payer: Self-pay | Admitting: Gerontology

## 2021-01-23 MED FILL — Losartan Potassium Tab 100 MG: ORAL | 90 days supply | Qty: 45 | Fill #0 | Status: AC

## 2021-01-23 MED FILL — Ergocalciferol Cap 1.25 MG (50000 Unit): ORAL | 84 days supply | Qty: 12 | Fill #0 | Status: AC

## 2021-01-25 ENCOUNTER — Other Ambulatory Visit: Payer: Self-pay

## 2021-01-26 ENCOUNTER — Other Ambulatory Visit: Payer: Self-pay

## 2021-01-26 MED ORDER — FLUTICASONE PROPIONATE 50 MCG/ACT NA SUSP
1.0000 | Freq: Every day | NASAL | 3 refills | Status: AC
  Filled 2021-01-26: qty 16, 30d supply, fill #0
  Filled 2021-05-01: qty 16, 60d supply, fill #1

## 2021-01-26 MED ORDER — METFORMIN HCL ER 500 MG PO TB24
ORAL_TABLET | ORAL | 0 refills | Status: DC
  Filled 2021-01-26: qty 30, 30d supply, fill #0

## 2021-01-27 ENCOUNTER — Other Ambulatory Visit: Payer: Self-pay

## 2021-01-30 ENCOUNTER — Other Ambulatory Visit: Payer: Self-pay

## 2021-02-02 ENCOUNTER — Telehealth: Payer: Self-pay | Admitting: Pharmacist

## 2021-02-02 NOTE — Telephone Encounter (Signed)
Patient [facility] eligible till 01/13/2022-aj

## 2021-02-07 ENCOUNTER — Ambulatory Visit: Payer: Medicaid Other | Admitting: Licensed Clinical Social Worker

## 2021-02-07 ENCOUNTER — Other Ambulatory Visit: Payer: Self-pay

## 2021-02-07 ENCOUNTER — Ambulatory Visit: Payer: Self-pay | Admitting: Licensed Clinical Social Worker

## 2021-02-07 DIAGNOSIS — F339 Major depressive disorder, recurrent, unspecified: Secondary | ICD-10-CM

## 2021-02-07 DIAGNOSIS — F411 Generalized anxiety disorder: Secondary | ICD-10-CM

## 2021-02-07 NOTE — BH Specialist Note (Signed)
Integrated Behavioral Health Follow Up In-Person Visit  MRN: [id number] Name: Anthony Lam   Total time: 30 minutes  Types of Service: Telephone visit  Patient consents to telephone visit and 2 patient identifiers were used to identify patient  Interpretor:No. Interpretor Name and Language:   Subjective: Anthony Lam is a 55 y.o. male accompanied by herself Patient was referred by Carlyon Shadow, NP  for mental health. Patient reports the following symptoms/concerns: The patient reports that he is doing well. He notes he has been going for walks in the park with his son who is a Education officer, museum. He discussed his son and how he helps him to be calm and more grounded. He discussed his pain levels and health stressors. He discussed relationship stressors. He noted that he is taking things day by day and that is helping. He noted sleeping is still difficult and that he has very little energy during the day. The patient denied any suicidal or homicidal thoughts.  Duration of problem: ; Severity of problem: moderate  Objective: Mood: Euthymic and Affect: Appropriate Risk of harm to self or others: No plan to harm self or others  Life Context: Family and Social: see  School/Work: see above Self-Care: see above Life Changes: see above  Patient and/or Family's Strengths/Protective Factors: Concrete supports in place (healthy food, safe environments, etc.)  Goals Addressed: Patient will: 1.  Reduce symptoms of: agitation, anxiety, depression and stress  2.  Increase knowledge and/or ability of: coping skills, healthy habits and stress reduction  3.  Demonstrate ability to: Increase healthy adjustment to current life circumstances  Progress towards Goals: Ongoing  Interventions: Interventions utilized:  CBT Cognitive Behavioral Therapy was utilized by the clinician during today's follow up session. The clinician processed with the patient how they have been doing since the last  follow-up session. The clinician provided a space for the patient to ventilate their frustrations regarding their current life circumstances. Clinician measured the patient's anxiety and depression on a numerical scale. Clinician encouraged the patient to practice good sleep hygiene. Clinician offered to e-mail patient a flyer for [place]. Standardized Assessments completed: GAD-7 and PHQ 9  GAD-7  13 PHQ-9  11  Patient and/or Family Response:  Patient was agreeable to the plans and treatment recommendations.  Patient Centered Plan: Patient is on the following Treatment Plan(s): The patient is to follow up with [facility] and to go to [place] or the closest emergency department if he experiences a crisis. Assessment: Patient currently experiencing see above  Patient may benefit from see above  Plan: 1. Follow up with behavioral health clinician on :  2. Behavioral recommendations:  3. Referral(s): [place] (In Clinic) and [place] (LME/Outside Clinic) 4. "From scale of 1-10, how likely are you to follow plan?":   Lesli Albee, Student-Social Work

## 2021-03-03 ENCOUNTER — Other Ambulatory Visit: Payer: Self-pay

## 2021-03-07 ENCOUNTER — Other Ambulatory Visit: Payer: Self-pay

## 2021-03-07 MED FILL — Amlodipine Besylate Tab 10 MG (Base Equivalent): ORAL | 90 days supply | Qty: 90 | Fill #0 | Status: AC

## 2021-03-08 ENCOUNTER — Other Ambulatory Visit: Payer: Self-pay

## 2021-03-09 ENCOUNTER — Telehealth: Payer: Self-pay | Admitting: Licensed Clinical Social Worker

## 2021-03-09 NOTE — Telephone Encounter (Signed)
Called patient to schedule a 30 minute EST PT Appointment for next week; No answer, Left Voicemail.

## 2021-03-30 ENCOUNTER — Telehealth: Payer: Self-pay | Admitting: Gerontology

## 2021-03-30 NOTE — Telephone Encounter (Signed)
Successfully contacted pt and scheduled an appt with Ms. Heather Pruitt for 04/06/21 at 4 pm.

## 2021-04-03 ENCOUNTER — Other Ambulatory Visit: Payer: Self-pay | Admitting: Gerontology

## 2021-04-03 ENCOUNTER — Other Ambulatory Visit: Payer: Self-pay

## 2021-04-03 MED FILL — Ergocalciferol Cap 1.25 MG (50000 Unit): ORAL | 28 days supply | Qty: 4 | Fill #1 | Status: AC

## 2021-04-03 MED FILL — Losartan Potassium Tab 25 MG: ORAL | 90 days supply | Qty: 90 | Fill #0 | Status: AC

## 2021-04-04 ENCOUNTER — Other Ambulatory Visit: Payer: Self-pay

## 2021-04-04 MED ORDER — METFORMIN HCL 500 MG PO TABS
ORAL_TABLET | ORAL | 2 refills | Status: DC
  Filled 2021-04-04: qty 30, 30d supply, fill #0

## 2021-04-05 ENCOUNTER — Other Ambulatory Visit: Payer: Medicaid Other

## 2021-04-05 ENCOUNTER — Other Ambulatory Visit: Payer: Self-pay

## 2021-04-05 DIAGNOSIS — R7303 Prediabetes: Secondary | ICD-10-CM

## 2021-04-06 ENCOUNTER — Ambulatory Visit: Payer: Medicaid Other | Admitting: Licensed Clinical Social Worker

## 2021-04-06 ENCOUNTER — Ambulatory Visit: Payer: Self-pay | Admitting: Licensed Clinical Social Worker

## 2021-04-06 DIAGNOSIS — F411 Generalized anxiety disorder: Secondary | ICD-10-CM

## 2021-04-06 DIAGNOSIS — F339 Major depressive disorder, recurrent, unspecified: Secondary | ICD-10-CM

## 2021-04-06 LAB — HEMOGLOBIN A1C
Est. average glucose Bld gHb Est-mCnc: 123 mg/dL
Hgb A1c MFr Bld: 5.9 % — ABNORMAL HIGH (ref 4.8–5.6)

## 2021-04-06 NOTE — BH Specialist Note (Signed)
Integrated Behavioral Health Follow Up In-Person Visit  MRN: [id number] Name: Anthony Lam   Total time: 60 minutes  Types of Service: Telephone visit Patient consents to telephone visit and 2 patient identifiers were used to identify patient   Interpretor:No. Interpretor Name and Language:  Subjective: Anthony Lam is a 55 y.o. male accompanied by  himself Patient was referred by Carlyon Shadow, NP for mental health. Patient reports the following symptoms/concerns: The patient notes he is doing okay since his last follow-up visit. He stated that he is in pain today and is not sure if the hot weather is making his bones hurt more. The patient discussed that he is going on a walk after the sun goes down with his son. He explained that his son is a great help to him. He noted that [facility] is repeatedly billing him for over $4000.00 for a colonoscopy procedure that was covered under his [place] charity care. He discussed his frustrations and concerns regarding not having the ability to pay this bill. He stated that this bill has been caused him a great deal of anxiety and he is afraid that this will be reported on his credit and ruin his life. He stated that his son was going to assist him in taking care of this matter, but that he can not stop worrying about it. He discussed other medical stressors in his life. The patient noted that he is making progress with his disability claim and that he filed for medicaid last Friday. He explained that he is frustrated that Medicaid has not replied to him yet. The patient noted that he was given 25 MG tablets of Losartan and is taking two at a time, but is scared is take more. He noted that he is unsure of his dose and is concerned that he only has 15- amlodipine tablets left. He requested the clinician message his primary care provider for clarification and refills. Mr. Yaffe stated overall he feels he is doing well with self care. The patient  denied any suicidal or homicidal thoughts.  Severity of problem: moderate  Objective: Mood: Euthymic and Affect: Appropriate Risk of harm to self or others: No plan to harm self or others  Life Context: Family and Social: see above School/Work: see above Self-Care: see above Life Changes: see above  Patient and/or Family's Strengths/Protective Factors: Social connections, Concrete supports in place (healthy food, safe environments, etc.), and Sense of purpose  Goals Addressed: Patient will:  Reduce symptoms of: anxiety, depression, insomnia, and stress   Increase knowledge and/or ability of: coping skills, healthy habits, self-management skills, and stress reduction   Demonstrate ability to: Increase healthy adjustment to current life circumstances  Progress towards Goals: Ongoing  Interventions: Interventions utilized:  CBT Cognitive Behavioral Therapy was utilized by the clinician during today's follow up session. The clinician processed with the patient how they have been doing since the last follow-up session. The clinician provided a space for the patient to ventilate their frustrations regarding their current life circumstances. Clinician measured the patient's anxiety and depression on a numerical scale.  Clinician encouraged the patient to take their mediation at the same time everyday exactly as prescribed for it to reach it's full intended effect. Clinician provided empathy and utilized reflective listening to help the patient feel supported and understood during therapy session. Clinician analyzed with the patient his fears regarding his medical bills ruining his credit rating and life by examining the probability of his negative expectation coming  to fruition, the consequences of the expectation if it occurred, and his ability to cope if it happened.  Standardized Assessments completed: GAD-7 and PHQ 9 GAD-7      9 PHQ-9    16   Assessment: Patient currently experiencing  see above.   Patient may benefit from see above.  Plan: Follow up with behavioral health clinician on : 04/20/21 at 10:00 AM  Behavioral recommendations:  Referral(s): [place] (In Clinic) "From scale of 1-10, how likely are you to follow plan?":   Lesli Albee, LCSWA

## 2021-04-12 ENCOUNTER — Other Ambulatory Visit: Payer: Self-pay

## 2021-04-12 ENCOUNTER — Encounter: Payer: Self-pay | Admitting: Gerontology

## 2021-04-12 ENCOUNTER — Ambulatory Visit: Payer: Medicaid Other | Admitting: Gerontology

## 2021-04-12 VITALS — BP 114/78 | HR 76 | Temp 97.1°F | Resp 16 | Ht 69.0 in | Wt 289.2 lb

## 2021-04-12 DIAGNOSIS — R7303 Prediabetes: Secondary | ICD-10-CM

## 2021-04-12 DIAGNOSIS — I10 Essential (primary) hypertension: Secondary | ICD-10-CM

## 2021-04-12 DIAGNOSIS — N1831 Chronic kidney disease, stage 3a: Secondary | ICD-10-CM

## 2021-04-12 MED ORDER — LOSARTAN POTASSIUM 50 MG PO TABS
50.0000 mg | ORAL_TABLET | Freq: Every day | ORAL | 3 refills | Status: DC
  Filled 2021-04-12 – 2021-05-01 (×2): qty 30, 30d supply, fill #0

## 2021-04-12 MED ORDER — OZEMPIC (0.25 OR 0.5 MG/DOSE) 2 MG/1.5ML ~~LOC~~ SOPN
0.2500 mg | PEN_INJECTOR | SUBCUTANEOUS | 0 refills | Status: DC
  Filled 2021-04-12: qty 1.5, 56d supply, fill #0

## 2021-04-12 NOTE — Progress Notes (Signed)
Established Patient Office Visit  Subjective:  Patient ID: Anthony Lam, male    DOB: 1965-10-18  Age: 55 y.o. MRN: [id number]  CC:  Chief Complaint  Patient presents with   Follow-up    Labs done 04/05/21 (hgb A1c). Patient saw Nephrologist on 04/07/21 and had additional labs drawn.   Hypertension    HPI Anthony Lam is a 55 year old male with PMH of HTN, DM, Chronic kidney disease, presents for for routine follow up and medication review.  His Hgb A1c done on 04/05/21 increased slightly from 5.8% to 5.9%. He self discontinued metformin because of GI symptoms. He denies hypo/hyperglycemic symptoms. He states that he continues to make healthy lifestyle changes. He follows up with Dr. Ardyth Man at [facility] nephrology for his chronic kidney disease and he recommends starting him on GLP-1 or SGLT-2 diabetes medicine. His blood pressure at this visit was 114/78, he reports compliance with hs medications and continues to make healthy lifestyle changes. He states that his mood is good. Overall, he states that he is doing well and offers no further complaints.  Past Medical History:  Diagnosis Date   Chronic kidney disease    stage 3a   Diabetes mellitus without complication ([place])    Hypertension    Migraines    Seizures ([place])    Sleep apnea    TB (tuberculosis), treated 2009    Past Surgical History:  Procedure Laterality Date   APPENDECTOMY     COLONOSCOPY WITH PROPOFOL N/A 01/26/2020   Procedure: COLONOSCOPY WITH PROPOFOL;  Surgeon: Jonathon Bellows, MD;  Location: [facility] ENDOSCOPY;  Service: Gastroenterology;  Laterality: N/A;    Family History  Problem Relation Age of Onset   Hypertension Mother    Constipation Father        chronic   Alzheimer's disease Father    Deep vein thrombosis Sister     Social History   Socioeconomic History   Marital status: Single    Spouse name: Not on file   Number of children: 2   Years of education: Not on file   Highest education level: 12th  grade  Occupational History   Not on file  Tobacco Use   Smoking status: Some Days    Years: 15.00    Pack years: 0.00    Types: Cigarettes   Smokeless tobacco: Never   Tobacco comments:    The only time he smokes is when he has a beer 1 cigarette. 1 pack of cigarettes lasts ~2 weeks.  Vaping Use   Vaping Use: Never used  Substance and Sexual Activity   Alcohol use: Yes    Alcohol/week: 1.0 standard drink    Types: 1 Cans of beer per week    Comment: 1-2 beers a week   Drug use: Never   Sexual activity: Yes  Other Topics Concern   Not on file  Social History Narrative   Not on file   Social Determinants of Health   Financial Resource Strain: Not on file  Food Insecurity: No Food Insecurity   Worried About Running Out of Food in the Last Year: Never true   Ran Out of Food in the Last Year: Never true  Transportation Needs: No Transportation Needs   Lack of Transportation (Medical): No   Lack of Transportation (Non-Medical): No  Physical Activity: Not on file  Stress: Not on file  Social Connections: Not on file  Intimate Partner Violence: Not on file    Outpatient Medications Prior to  Visit  Medication Sig Dispense Refill   amLODipine (NORVASC) 10 MG tablet TAKE ONE TABLET BY MOUTH EVERY DAY 90 tablet 1   fluticasone (FLONASE) 50 MCG/ACT nasal spray USE 1 SPRAY IN EACH NOSTRIL DAILY 16 g 3   gabapentin (NEURONTIN) 100 MG capsule TAKE ONE CAPSULE BY MOUTH 3 TIMES A DAY 90 capsule 1   lidocaine (LIDODERM) 5 % APPLY PATCH DAILY AS DIRECTED AND LEAVE ON FOR ONLY 12 HOURS THEN REMOVE. 30 patch 0   losartan (COZAAR) 25 MG tablet TAKE ONE TABLET BY MOUTH EVERY DAY 90 tablet 0   Vitamin D, Ergocalciferol, (DRISDOL) 1.25 MG (50000 UNIT) CAPS capsule TAKE ONE CAPSULE BY MOUTH ONCE A WEEK 12 capsule 3   cetirizine (ZYRTEC) 10 MG tablet TAKE ONE TABLET BY MOUTH EVERY DAY (Patient not taking: Reported on 04/12/2021) 60 tablet 0   fluticasone (FLONASE) 50 MCG/ACT nasal spray Place 1  spray into both nostrils daily.      losartan (COZAAR) 100 MG tablet TAKE 1/2 TABLETS BY MOUTH EVERY DAY 45 tablet 1   losartan (COZAAR) 50 MG tablet Take 1 tablet by mouth daily. Take with 25 mg pill to equal 5m daily (Patient not taking: Reported on 04/12/2021)     metFORMIN (GLUCOPHAGE) 500 MG tablet TAKE 1 TABLET BY MOUTH EVERY DAY WITH BREAKFAST. (Patient not taking: Reported on 04/12/2021) 30 tablet 2   Vitamin D, Ergocalciferol, (DRISDOL) 1.25 MG (50000 UNIT) CAPS capsule Take 1 capsule (50,000 Units total) by mouth every 7 (seven) days. 16 capsule 0   No facility-administered medications prior to visit.    Allergies  Allergen Reactions   Metformin And Related Diarrhea    ROS Review of Systems  Constitutional: Negative.   HENT: Negative.    Respiratory: Negative.    Cardiovascular: Negative.   Gastrointestinal: Negative.   Genitourinary: Negative.   Skin: Negative.   Neurological: Negative.   Psychiatric/Behavioral: Negative.       Objective:    Physical Exam Constitutional:      Appearance: Normal appearance.  HENT:     Head: Normocephalic.  Pulmonary:     Effort: Pulmonary effort is normal.     Breath sounds: Normal breath sounds.  Abdominal:     General: Bowel sounds are normal.  Skin:    General: Skin is warm and dry.     Capillary Refill: Capillary refill takes less than 2 seconds.  Neurological:     Mental Status: He is alert and oriented to person, place, and time.  Psychiatric:        Mood and Affect: Mood normal.    BP 114/78 (BP Location: Right Arm, Patient Position: Sitting, Cuff Size: Large)   Pulse 76   Temp (!) 97.1 F (36.2 C)   Resp 16   Ht '5\' 9"'  (1.753 m)   Wt 289 lb 3.2 oz (131.2 kg)   SpO2 94%   BMI 42.71 kg/m  Wt Readings from Last 3 Encounters:  04/12/21 289 lb 3.2 oz (131.2 kg)  04/05/21 287 lb (130.2 kg)  01/11/21 284 lb 1.6 oz (128.9 kg)  Encourage weight loss   Health Maintenance Due  Topic Date Due   COVID-19 Vaccine  (1) Never done   Pneumococcal Vaccine 062661Years old (1 - PCV) Never done   HIV Screening  Never done   Hepatitis C Screening  Never done   TETANUS/TDAP  Never done   Zoster Vaccines- Shingrix (1 of 2) Never done    There are no  preventive care reminders to display for this patient.  Lab Results  Component Value Date   TSH 2.480 11/30/2020   Lab Results  Component Value Date   WBC WILL FOLLOW 02/17/2020   WBC 6.4 02/17/2020   HGB WILL FOLLOW 02/17/2020   HGB 14.9 02/17/2020   HCT WILL FOLLOW 02/17/2020   HCT 47.1 02/17/2020   MCV WILL FOLLOW 02/17/2020   MCV 82 02/17/2020   PLT WILL FOLLOW 02/17/2020   PLT 275 02/17/2020   Lab Results  Component Value Date   NA 140 12/14/2020   K 4.3 12/14/2020   CO2 21 12/14/2020   GLUCOSE 96 12/14/2020   BUN 15 12/14/2020   CREATININE 1.68 (H) 12/14/2020   BILITOT 0.4 11/30/2020   ALKPHOS 89 11/30/2020   AST 19 11/30/2020   ALT 16 11/30/2020   PROT 7.8 11/30/2020   ALBUMIN 4.7 12/14/2020   CALCIUM 9.6 12/14/2020   ANIONGAP 6 (L) 08/04/2013   EGFR 48 (L) 12/14/2020   Lab Results  Component Value Date   CHOL 162 06/03/2019   Lab Results  Component Value Date   HDL 44 06/03/2019   Lab Results  Component Value Date   LDLCALC 97 06/03/2019   Lab Results  Component Value Date   TRIG 104 06/03/2019   Lab Results  Component Value Date   CHOLHDL 3.7 06/03/2019   Lab Results  Component Value Date   HGBA1C 5.9 (H) 04/05/2021      Assessment & Plan:  1. Prediabetes Continue low carbohydrate, non concentrated sweets diet and exercise as tolerated - Semaglutide,0.25 or 0.5MG/DOS, (OZEMPIC, 0.25 OR 0.5 MG/DOSE,) 2 MG/1.5ML SOPN; Inject 0.25 mg into the skin once a week.  Dispense: 1.5 mL; Refill: 0  2. Essential hypertension His blood pressure is under control, continue medication regimen DASH diet encouraged - losartan (COZAAR) 50 MG tablet; Take 1 tablet (50 mg total) by mouth daily. Take with 25 mg pill to equal  85m daily  Dispense: 30 tablet; Refill: 3  3. Stage 3a chronic [place] disease [place] [facility]    Follow-up: Return in about 13 weeks (around 07/12/2021), or if symptoms worsen or fail to improve.    ODanella Maiers RN

## 2021-04-12 NOTE — Patient Instructions (Signed)
Call us when you receive the Ozempic.

## 2021-04-13 ENCOUNTER — Other Ambulatory Visit: Payer: Self-pay

## 2021-04-20 ENCOUNTER — Ambulatory Visit: Payer: Medicaid Other | Admitting: Licensed Clinical Social Worker

## 2021-04-20 ENCOUNTER — Other Ambulatory Visit: Payer: Self-pay

## 2021-04-20 ENCOUNTER — Ambulatory Visit: Payer: Self-pay | Admitting: Licensed Clinical Social Worker

## 2021-04-20 DIAGNOSIS — F339 Major depressive disorder, recurrent, unspecified: Secondary | ICD-10-CM

## 2021-04-20 DIAGNOSIS — F419 Anxiety disorder, unspecified: Secondary | ICD-10-CM

## 2021-04-20 NOTE — BH Specialist Note (Signed)
Integrated Behavioral Health Follow Up In-Person Visit  MRN: [id number] Name: Anthony Lam  Total time: 60 minutes  Types of Service: Telephone visit Patient consents to telephone visit and 2 patient identifiers were used to identify patient.   Interpretor:No. Interpretor Name and Language: N/A  Subjective: Anthony Lam is a 55 y.o. male accompanied by  himself Patient was referred by Anthony Shadow, NP for mental health. Patient reports the following symptoms/concerns: The patient reports that he is in more pain today than usual. He stated that he is otherwise about the same since his last follow-up session. He discussed his health stressors that are impacting his life. He reports he is concerned about his nostril and "Googled" his symptoms and is very worried. He noted he tried to convey his concerns to his primary care provider but felt unheard. He is concerned he is not communicating with his doctors effectively. The patient discussed his families' concerns and comments regarding his health. He stated his mother had fluid around her heart and needed to take pills and he feels like he may need have fluid around his heart and need pills too. He discussed not understanding how someone could tell if he has fluid around his heart by listening and he felt he needed scans to be sure his heart was okay. He discussed the impact the hot weather on his daily activities. He noted he is trying very hard to eat better, but has cravings for fried foods such as bologna. The patient denied any suicidal or homicidal thoughts.  Duration of problem: Years; Severity of problem: moderate  Objective: Mood: Euthymic and Affect: Appropriate Risk of harm to self or others: No plan to harm self or others  Life Context: Family and Social: see above School/Work: see above Self-Care: see above Life Changes: see above  Patient and/or Family's Strengths/Protective Factors: Social connections, Concrete  supports in place (healthy food, safe environments, etc.), and Sense of purpose  Goals Addressed: Patient will:  Reduce symptoms of: anxiety, depression, and stress   Increase knowledge and/or ability of: coping skills, healthy habits, self-management skills, and stress reduction   Demonstrate ability to: Increase healthy adjustment to current life circumstances  Progress towards Goals: Ongoing  Interventions: Interventions utilized:  CBT Cognitive Behavioral Therapy was utilized by the clinician during today's follow up session. The clinician processed with the patient how they have been doing since the last follow-up session. The clinician provided a space for the patient to ventilate their frustrations regarding their current life circumstances. Clinician nurtured a validating relationship throughout the therapy session by not dismissing or trivializing health complaints of the patient. Clinician explored with the patient and challenged his negative thoughts by looking for evidences supporting the thought or exploring other positive replacement thoughts. Clinician measured the patient's anxiety and depression on a numerical scale. Clinician encouraged the patient to speak to his primary care provider about his health concerns and to utilize his coping skills to help deal with his current life circumstances.  Standardized Assessments completed: GAD-7 and PHQ 9 GAD-7      13 PHQ-9      15  Assessment: Patient currently experiencing see above.   Patient may benefit from see above.  Plan: Follow up with behavioral health clinician on : 04/26/2021 at 11:00 AM  Behavioral recommendations: Somatic symptom  Referral(s): [place] (In Clinic) "From scale of 1-10, how likely are you to follow plan?": [place], LCSWA

## 2021-04-25 ENCOUNTER — Ambulatory Visit: Payer: Medicaid Other | Admitting: Gerontology

## 2021-04-25 ENCOUNTER — Other Ambulatory Visit: Payer: Self-pay

## 2021-04-25 ENCOUNTER — Encounter: Payer: Self-pay | Admitting: Gerontology

## 2021-04-25 VITALS — BP 129/89 | HR 70 | Temp 97.9°F | Resp 18 | Ht 69.0 in | Wt 288.5 lb

## 2021-04-25 DIAGNOSIS — R002 Palpitations: Secondary | ICD-10-CM | POA: Insufficient documentation

## 2021-04-25 DIAGNOSIS — R0981 Nasal congestion: Secondary | ICD-10-CM | POA: Insufficient documentation

## 2021-04-25 MED ORDER — SALINE SPRAY 0.65 % NA SOLN
1.0000 | NASAL | 0 refills | Status: DC | PRN
  Filled 2021-04-25: qty 30, fill #0

## 2021-04-25 NOTE — Progress Notes (Signed)
Established Patient Office Visit  Subjective:  Patient ID: Anthony Lam, male    DOB: 06-19-1966  Age: 55 y.o. MRN: [id number]  CC:  Chief Complaint  Patient presents with   Shortness of Breath    Patient in today c/o sob off & on x 2 months. Patient also states he feels like his heart is "fluttering" off & on x 2 months.   Nasal Congestion    Patient states he has trouble with his nose being congestion off & on x "years"    HPI Anthony Lam is a 55 y/o male who has history of CKD stage 3, Hypertension, Sleep apnea, presents for c/o intermittent fluttering to his left chest for the past 2 months, at rest lying down. He states that the fluttering symptom is associated with intermittent shortness of breath, but denies dizziness, nausea/vomiting and chest pain. He states that he experiences 2 episodes of palpitation daily, but has not experienced any episode in past 4 days. He states that the fluttering sensation resolves within an hour with massaging his left chest. He also c/o intermittent bilateral nasal congestion when he's sleeping which has been going on for more than six months, and he continues to use his CPAP at night. He denies sick contact, sinus pressure and pain. Overall, he states that he's doing well, but concerned about the fluttering sensation in his heart because his mother has cardiac history.   Past Medical History:  Diagnosis Date   Chronic kidney disease    stage 3a   Diabetes mellitus without complication ([place])    Hypertension    Migraines    Seizures ([place])    Sleep apnea    TB (tuberculosis), treated 2009    Past Surgical History:  Procedure Laterality Date   APPENDECTOMY     COLONOSCOPY WITH PROPOFOL N/A 01/26/2020   Procedure: COLONOSCOPY WITH PROPOFOL;  Surgeon: Jonathon Bellows, MD;  Location: [facility] ENDOSCOPY;  Service: Gastroenterology;  Laterality: N/A;    Family History  Problem Relation Age of Onset   Hypertension Mother    Constipation Father         chronic   Alzheimer's disease Father    Deep vein thrombosis Sister     Social History   Socioeconomic History   Marital status: Single    Spouse name: Not on file   Number of children: 2   Years of education: Not on file   Highest education level: 12th grade  Occupational History   Not on file  Tobacco Use   Smoking status: Some Days    Years: 15.00    Pack years: 0.00    Types: Cigarettes   Smokeless tobacco: Never   Tobacco comments:    The only time he smokes is when he has a beer 1 cigarette. 1 pack of cigarettes lasts ~2 weeks.  Vaping Use   Vaping Use: Never used  Substance and Sexual Activity   Alcohol use: Yes    Alcohol/week: 1.0 standard drink    Types: 1 Cans of beer per week    Comment: 1-2 beers a week   Drug use: Never   Sexual activity: Yes  Other Topics Concern   Not on file  Social History Narrative   Not on file   Social Determinants of Health   Financial Resource Strain: Not on file  Food Insecurity: No Food Insecurity   Worried About Running Out of Food in the Last Year: Never true   [employer] of Food  in the Last Year: Never true  Transportation Needs: No Transportation Needs   Lack of Transportation (Medical): No   Lack of Transportation (Non-Medical): No  Physical Activity: Not on file  Stress: Not on file  Social Connections: Not on file  Intimate Partner Violence: Not on file    Outpatient Medications Prior to Visit  Medication Sig Dispense Refill   amLODipine (NORVASC) 10 MG tablet TAKE ONE TABLET BY MOUTH EVERY DAY 90 tablet 1   fluticasone (FLONASE) 50 MCG/ACT nasal spray USE 1 SPRAY IN EACH NOSTRIL DAILY 16 g 3   gabapentin (NEURONTIN) 100 MG capsule TAKE ONE CAPSULE BY MOUTH 3 TIMES A DAY 90 capsule 1   lidocaine (LIDODERM) 5 % APPLY PATCH DAILY AS DIRECTED AND LEAVE ON FOR ONLY 12 HOURS THEN REMOVE. 30 patch 0   losartan (COZAAR) 50 MG tablet Take 1 tablet (50 mg total) by mouth daily. Take with 25 mg pill to equal 46m daily  30 tablet 3   cetirizine (ZYRTEC) 10 MG tablet TAKE ONE TABLET BY MOUTH EVERY DAY 60 tablet 0   Semaglutide,0.25 or 0.5MG/DOS, (OZEMPIC, 0.25 OR 0.5 MG/DOSE,) 2 MG/1.5ML SOPN Inject 0.25 mg into the skin once a week. (Patient not taking: Reported on 04/25/2021) 1.5 mL 0   No facility-administered medications prior to visit.    Allergies  Allergen Reactions   Metformin And Related Diarrhea    ROS Review of Systems  Constitutional: Negative.   Respiratory:  Positive for shortness of breath.   Cardiovascular:  Positive for palpitations.  Neurological: Negative.   Psychiatric/Behavioral: Negative.       Objective:    Physical Exam HENT:     Head: Normocephalic and atraumatic.     Nose: Nose normal. No nasal deformity, septal deviation, signs of injury, laceration, nasal tenderness, mucosal edema, congestion or rhinorrhea.     Right Nostril: No foreign body, epistaxis, septal hematoma or occlusion.     Left Nostril: No foreign body, epistaxis, septal hematoma or occlusion.     Right Turbinates: Not enlarged, swollen or pale.     Left Turbinates: Not enlarged, swollen or pale.     Right Sinus: No maxillary sinus tenderness or frontal sinus tenderness.     Left Sinus: No maxillary sinus tenderness or frontal sinus tenderness.  Cardiovascular:     Rate and Rhythm: Normal rate and regular rhythm.  Pulmonary:     Effort: Pulmonary effort is normal.     Breath sounds: Normal breath sounds.  Chest:     Chest wall: No mass, deformity, tenderness, crepitus or edema.  Skin:    General: Skin is warm.  Neurological:     General: No focal deficit present.     Mental Status: He is alert and oriented to person, place, and time. Mental status is at baseline.  Psychiatric:        Mood and Affect: Mood normal.        Behavior: Behavior normal.    BP 129/89 (BP Location: Right Arm, Patient Position: Sitting, Cuff Size: Large)   Pulse 70   Temp 97.9 F (36.6 C)   Resp 18   Ht _0   (1.753 m)   Wt 288 lb 8 oz (130.9 kg)   SpO2 94%   BMI 42.60 kg/m  Wt Readings from Last 3 Encounters:  04/25/21 288 lb 8 oz (130.9 kg)  04/12/21 289 lb 3.2 oz (131.2 kg)  04/05/21 287 lb (130.2 kg)   Encouraged weight loss  Health Maintenance  Due  Topic Date Due   COVID-19 Vaccine (1) Never done   Pneumococcal Vaccine 56-30 Years old (1 - PCV) Never done   HIV Screening  Never done   Hepatitis C Screening  Never done   TETANUS/TDAP  Never done   Zoster Vaccines- Shingrix (1 of 2) Never done    There are no preventive care reminders to display for this patient.  Lab Results  Component Value Date   TSH 2.480 11/30/2020   Lab Results  Component Value Date   WBC WILL FOLLOW 02/17/2020   WBC 6.4 02/17/2020   HGB WILL FOLLOW 02/17/2020   HGB 14.9 02/17/2020   HCT WILL FOLLOW 02/17/2020   HCT 47.1 02/17/2020   MCV WILL FOLLOW 02/17/2020   MCV 82 02/17/2020   PLT WILL FOLLOW 02/17/2020   PLT 275 02/17/2020   Lab Results  Component Value Date   NA 140 12/14/2020   K 4.3 12/14/2020   CO2 21 12/14/2020   GLUCOSE 96 12/14/2020   BUN 15 12/14/2020   CREATININE 1.68 (H) 12/14/2020   BILITOT 0.4 11/30/2020   ALKPHOS 89 11/30/2020   AST 19 11/30/2020   ALT 16 11/30/2020   PROT 7.8 11/30/2020   ALBUMIN 4.7 12/14/2020   CALCIUM 9.6 12/14/2020   ANIONGAP 6 (L) 08/04/2013   EGFR 48 (L) 12/14/2020   Lab Results  Component Value Date   CHOL 162 06/03/2019   Lab Results  Component Value Date   HDL 44 06/03/2019   Lab Results  Component Value Date   LDLCALC 97 06/03/2019   Lab Results  Component Value Date   TRIG 104 06/03/2019   Lab Results  Component Value Date   CHOLHDL 3.7 06/03/2019   Lab Results  Component Value Date   HGBA1C 5.9 (H) 04/05/2021      Assessment & Plan:    1. Fluttering sensation of heart -Unconfirmed EKG done at the clinic was abnormal and has inferior infarct. - EKG 12-Lead - Ambulatory referral to Cardiology - He was advised  to go to the ED for worsening symptoms.  2. Complaint of nasal congestion - Nostrils were clear, no septal deviation, will continue on Saline nasal spray. He was advised to notify clinic for worsening symptoms for possible ENT referral. - sodium chloride (OCEAN) 0.65 % SOLN nasal spray; Spray 1 spray into both nostrils daily as needed for congestion.  Dispense: 30 mL; Refill: 0    Follow-up: Return in about 6 weeks (around 06/08/2021), or if symptoms worsen or fail to improve.    Raneshia Derick Jerold Coombe, NP

## 2021-04-26 ENCOUNTER — Ambulatory Visit: Payer: Self-pay | Admitting: Licensed Clinical Social Worker

## 2021-04-26 ENCOUNTER — Ambulatory Visit: Payer: Medicaid Other | Admitting: Licensed Clinical Social Worker

## 2021-04-26 DIAGNOSIS — F419 Anxiety disorder, unspecified: Secondary | ICD-10-CM

## 2021-04-26 DIAGNOSIS — F339 Major depressive disorder, recurrent, unspecified: Secondary | ICD-10-CM

## 2021-04-26 NOTE — BH Specialist Note (Signed)
Integrated Behavioral Health Follow Up In-Person Visit  MRN: [id number] Name: Anthony Lam   Total time: 30 minutes  Types of Service: Telephone visit Patient consents to telephone visit and 2 patient identifiers were used to identify patient   Interpretor:No. Interpretor Name and Language: N/A  Subjective: Anthony Lam is a 56 y.o. male accompanied by  himself Patient was referred by Carlyon Shadow, NP for Mental Health. Patient reports the following symptoms/concerns: The patient stated that he is doing okay other than some health issues. He explained that his friend stopped by this morning and took him to the gym. He explained that while his friend lifts weights he walks on the treadmill. He stated that he was able to walk for an hour and a half on the treadmill today at a slow pace, but had to take several breaks to sit down. He discussed several health stressors impacting his life that present as barriers to participating fully in activities he enjoys. Mr. Edelson shared that his son gets off work at 4:00 PM and would likely call him to postpone their daily afternoon walk together due to the rain. He explained that his son is a Education officer, museum who helps him get through rough days by encouraging him to think positively. He reports that he saw his provider yesterday at the [facility] and had an EKG because he has been experiencing heart flutters. He stated his provider told him she would like him to be seen in [facility], and he is worried about the results of his test. He explained that he is trying to be calm and think positively, and hopes the heart flutters may have come from a pulled muscle at the gym. The patient denied any suicidal or homicidal thoughts.   Duration of problem: Years; Severity of problem: moderate  Objective: Mood: Euthymic and Affect: Appropriate Risk of harm to self or others: No plan to harm self or others  Life Context: Family and Social: see  above School/Work: see above Self-Care: see above Life Changes: see above  Patient and/or Family's Strengths/Protective Factors: Concrete supports in place (healthy food, safe environments, etc.) and Sense of purpose  Goals Addressed: Patient will:  Reduce symptoms of: anxiety, depression, insomnia, and stress   Increase knowledge and/or ability of: coping skills, healthy habits, self-management skills, and stress reduction   Demonstrate ability to: Increase healthy adjustment to current life circumstances  Progress towards Goals: Ongoing  Interventions: Interventions utilized:  CBT Cognitive Behavioral Therapy was utilized by the clinician during today's follow up session. The clinician processed with the patient how they have been doing since the last follow-up session. The clinician provided a space for the patient to ventilate their frustrations regarding their current life circumstances. Clinician measured the patient's anxiety and depression on a numerical scale. Clinician explained to the patient that his son gave him good advice and encouraged him to focus on the positives rather than the negatives in his life. The clinician also encouraged the patient to continue to utilize self care daily and to utilize their coping skills to deal with their current life circumstances.   Standardized Assessments completed: GAD-7 and PHQ 9 PHQ-9       13 GAD-7       10    Assessment: Patient currently experiencing see above.   Patient may benefit from see above.  Plan: Follow up with behavioral health clinician on : 05/09/2021 at 9:00 AM  Behavioral recommendations:  Referral(s): [place] (  In Clinic) "From scale of 1-10, how likely are you to follow plan?":   Lesli Albee, LCSWA

## 2021-05-01 ENCOUNTER — Other Ambulatory Visit: Payer: Self-pay

## 2021-05-01 MED FILL — Gabapentin Cap 100 MG: ORAL | 30 days supply | Qty: 90 | Fill #0 | Status: AC

## 2021-05-02 ENCOUNTER — Other Ambulatory Visit: Payer: Self-pay | Admitting: Gerontology

## 2021-05-02 ENCOUNTER — Other Ambulatory Visit: Payer: Self-pay

## 2021-05-02 DIAGNOSIS — I10 Essential (primary) hypertension: Secondary | ICD-10-CM

## 2021-05-02 MED ORDER — LOSARTAN POTASSIUM 50 MG PO TABS
75.0000 mg | ORAL_TABLET | Freq: Every day | ORAL | 3 refills | Status: DC
  Filled 2021-05-02: qty 45, 30d supply, fill #0
  Filled 2021-06-01: qty 45, 30d supply, fill #1

## 2021-05-09 ENCOUNTER — Ambulatory Visit: Payer: Medicaid Other | Admitting: Licensed Clinical Social Worker

## 2021-05-09 ENCOUNTER — Ambulatory Visit: Payer: Self-pay | Admitting: Licensed Clinical Social Worker

## 2021-05-09 ENCOUNTER — Other Ambulatory Visit: Payer: Self-pay

## 2021-05-09 DIAGNOSIS — F419 Anxiety disorder, unspecified: Secondary | ICD-10-CM

## 2021-05-09 DIAGNOSIS — F339 Major depressive disorder, recurrent, unspecified: Secondary | ICD-10-CM

## 2021-05-09 NOTE — BH Specialist Note (Signed)
Integrated Behavioral Health Follow Up In-Person Visit  MRN: [id number] Name: Anthony Lam   Total time: 60 minutes  Types of Service: Telephone visitPatient consents to telephone visit and 2 patient identifiers were used to identify patient   Interpretor:No. Interpretor Name and Language:   Subjective: Anthony Lam is a 55 y.o. male accompanied by  himself Patient was referred by Anthony Shadow, NP  for mental health. Patient reports the following symptoms/concerns: The patient stated he has been doing well since his last follow-up session. He noted that at night he tries not to eat anything heavy before he goes to bed. He stated that he does have a light snack from time to time to fight off the hunger pains. He explained that he sometimes forgets to take his nighttime medications and will have difficulty falling asleep. He notes that he wakes throughout the night to go to the bathroom but usually can easily fall back to sleep with out much problem. He stated he sleeps in his recliner sometimes, but usually he will sleep in his bed. The patient noted he does sleep with the television off most of the time. He stated he has no nightmares but occasionally he can recall a dream. He reports that he has sleep apnea and uses his cpap machine every night. The pateint stated he is eating well and is exercising most days of the week. He described feeling depressed when he thinks of his health problems or when he needs to ask for help from others. He explained that he is hurt when he needs a rife and he is turned down by friends. He explained that his son told him to call anytime he needs a ride, but he doesn't want to be a burden to him. Mr. Majewski identified several coping skills he utilizes to process difficult emotions such as deep breathing, exercise, playing games on his phone, and talking to his son. He notes overall he is doing better. The patient denied any suicidal or homicidal thoughts.   Duration of problem: Years; Severity of problem: moderate  Objective: Mood: Euthymic and Affect: Appropriate Risk of harm to self or others: No plan to harm self or others  Life Context: Family and Social: see above School/Work: see above Self-Care: see above Life Changes: see above  Patient and/or Family's Strengths/Protective Factors: Concrete supports in place (healthy food, safe environments, etc.)  Goals Addressed: Patient will:  Reduce symptoms of: anxiety, depression, insomnia, and stress   Increase knowledge and/or ability of: coping skills   Demonstrate ability to: Increase healthy adjustment to current life circumstances  Progress towards Goals: Ongoing  Interventions: Interventions utilized:  CBT Cognitive Behavioral Therapy was utilized by the clinician during today's follow up session. The clinician processed with the patient how they have been doing since the last follow-up session. The clinician provided a space for the patient to ventilate their frustrations regarding their current life circumstances. Clinician measured the patient's anxiety and depression on a numerical scale.  Clinician assessed the patients pattern of sleep, bedtime routines, activities associated with the bed, activity and energy level while awake, night time snacking, stimulant use, daytime napping, total sleep amounts. Clinician explored the patients thoughts and associated emotions regarding sleep. Clinician congratulated the patient for doing so well and incorporating self care into his daily routine. The clinician encouraged the patient to continue to utilize their coping skills to deal with their current life circumstances.   Standardized Assessments completed: GAD-7 and PHQ 9 GAD-7 PHQ-9  Assessment: Patient currently experiencing see above.   Patient may benefit from see above.  Plan: Follow up with behavioral health clinician on : 05/23/2021 at 10:00 AM Behavioral recommendations:   Referral(s): [place] (In Clinic) "From scale of 1-10, how likely are you to follow plan?":  Anthony Lam, LCSWA

## 2021-05-15 DIAGNOSIS — Z419 Encounter for procedure for purposes other than remedying health state, unspecified: Secondary | ICD-10-CM | POA: Diagnosis not present

## 2021-05-23 ENCOUNTER — Ambulatory Visit: Payer: Medicaid Other | Admitting: Licensed Clinical Social Worker

## 2021-05-23 ENCOUNTER — Ambulatory Visit: Payer: Self-pay | Admitting: Licensed Clinical Social Worker

## 2021-05-23 ENCOUNTER — Other Ambulatory Visit: Payer: Self-pay

## 2021-05-23 DIAGNOSIS — F339 Major depressive disorder, recurrent, unspecified: Secondary | ICD-10-CM

## 2021-05-23 DIAGNOSIS — F419 Anxiety disorder, unspecified: Secondary | ICD-10-CM

## 2021-05-23 NOTE — BH Specialist Note (Signed)
Integrated Behavioral Health Follow Up In-Person Visit  MRN: [id number] Name: Anthony Lam  Total time: 60 minutes  Types of Service: Telephone visit Patient consents to telephone visit and 2 patient identifiers were used to identify patient   Interpretor:No. Interpretor Name and Language: N/A  Subjective: Anthony Lam is a 55 y.o. male accompanied by  himself  Patient was referred by Carlyon Shadow., NP for mental health. Patient reports the following symptoms/concerns: The patient notes he has been doing well since his last follow-up appointment. He explained that he has been going to the gym with a friend and aggravated his back and is in pain this morning. He reported that his daily talks and visits with his son are encouraging and helping him to remain in a positive mental state. He notes that he does become depressed when he thinks of his problems especially with transportation and still worries a lot about his health. He discussed his financial and health stressors that are impacting him currently. He explained that he is eating well but is not sure how to use his medication for diabetes and is waiting for his primary care provider to show him how to use it. Overall the patient offered no other complaints and feels he is moving in the right direction with his mental health. He denied any suicidal or homicidal thoughts. .  Duration of problem: Years; Severity of problem: moderate  Objective: Mood: Euthymic and Affect: Appropriate Risk of harm to self or others: No plan to harm self or others  Life Context: Family and Social: see above School/Work: see above  Self-Care: see above Life Changes: see above  Patient and/or Family's Strengths/Protective Factors: Concrete supports in place (healthy food, safe environments, etc.)  Goals Addressed: Patient will:  Reduce symptoms of: anxiety, depression, insomnia, and stress   Increase knowledge and/or ability of: coping skills,  healthy habits, self-management skills, and stress reduction   Demonstrate ability to: Increase healthy adjustment to current life circumstances  Progress towards Goals: Ongoing  Interventions: Interventions utilized:  CBT Cognitive Behavioral Therapywas utilized by the clinician during today's follow up session. The clinician processed with the patient how they have been doing since the last follow-up session. The clinician provided a space for the patient to ventilate their frustrations regarding their current life circumstances. Clinician measured the patient's anxiety and depression on a numerical scale.  The clinician encouraged the patient to utilize their coping skills to deal with their current life circumstances. Clinician congratulated the patient on his progress and for doing well incorporating self care into his daily routine. Clinician offered to message the patient's primary care provider regarding his medication questions.  Standardized Assessments completed: GAD-7 and PHQ 9 GAD-7     9 PHQ-9     7   Assessment: Patient currently experiencing see above.   Patient may benefit from see above.  Plan: Follow up with behavioral health clinician on : 06/06/2021 at 9:00 AM  Behavioral recommendations:  Referral(s): [place] (In Clinic) "From scale of 1-10, how likely are you to follow plan?":   Lesli Albee, LCSWA

## 2021-05-29 ENCOUNTER — Other Ambulatory Visit: Payer: Self-pay

## 2021-05-31 ENCOUNTER — Other Ambulatory Visit: Payer: Self-pay

## 2021-05-31 NOTE — Progress Notes (Unsigned)
Patient had to leave prior to being seen

## 2021-06-01 ENCOUNTER — Other Ambulatory Visit: Payer: Self-pay

## 2021-06-01 MED FILL — Amlodipine Besylate Tab 10 MG (Base Equivalent): ORAL | 90 days supply | Qty: 90 | Fill #1 | Status: AC

## 2021-06-01 MED FILL — Gabapentin Cap 100 MG: ORAL | 30 days supply | Qty: 90 | Fill #1 | Status: AC

## 2021-06-06 ENCOUNTER — Ambulatory Visit: Payer: Medicaid Other | Admitting: Licensed Clinical Social Worker

## 2021-06-06 ENCOUNTER — Ambulatory Visit: Payer: Self-pay | Admitting: Licensed Clinical Social Worker

## 2021-06-06 ENCOUNTER — Other Ambulatory Visit: Payer: Self-pay

## 2021-06-06 DIAGNOSIS — F419 Anxiety disorder, unspecified: Secondary | ICD-10-CM

## 2021-06-06 DIAGNOSIS — F339 Major depressive disorder, recurrent, unspecified: Secondary | ICD-10-CM

## 2021-06-06 NOTE — BH Specialist Note (Signed)
Integrated Behavioral Health Follow Up In-Person Visit  MRN: [id number] Name: Anthony Lam   Total time: 30 minutes  Types of Service: Telephone visit Patient consents to telephone visit and 2 patient identifiers were used to identify patient  Interpretor:No. Interpretor Name and Language: N/A  Subjective: Anthony Lam is a 55 y.o. male accompanied by  Herself Patient was referred by Carlyon Shadow, NP for mental health. Patient reports the following symptoms/concerns: The patient reported he has been doing well since his last follow-up session. Mr. Bjork reports he was not able to talk long this morning because he had an emergency. He explained that he woke up to a busted waterline in his kitchen. He explained that he called [id number] and a policeman can and turned off his water at the street. He shared he was feeling very confused this morning, and noted that his girlfriend was coming over to help out. He stated that he thought he was talking to his landlord at times and he was talking to other people. He explained that overall he feels he has been doing well and is eating and sleeping okay. He shared that his son is going with him on walks and he goes to the gym with his friend once a week. The patient denied any suicidal or homicidal thoughts.  Duration of problem: Years; Severity of problem: moderate  Objective: Mood: Euthymic and Affect: Appropriate Risk of harm to self or others: No plan to harm self or others  Life Context: Family and Social: see above School/Work: see above Self-Care: see above Life Changes: see above  Patient and/or Family's Strengths/Protective Factors: Concrete supports in place (healthy food, safe environments, etc.)  Goals Addressed: Patient will:  Reduce symptoms of: anxiety, depression, insomnia, and stress   Increase knowledge and/or ability of: coping skills, healthy habits, self-management skills, and stress reduction   Demonstrate ability to:  Increase healthy adjustment to current life circumstances  Progress towards Goals: Ongoing  Interventions: Interventions utilized:  Supportive Counseling was utilized by the clinician during today's follow up session. The clinician processed with the patient how they have been doing since the last follow-up session. The clinician provided a space for the patient to ventilate their frustrations regarding their current life circumstances. Clinician measured the patient's anxiety and depression on a numerical scale.  The clinician encouraged the patient to utilize their coping skills to deal with their current life circumstances.   Standardized Assessments completed: GAD-7 and PHQ 9 GAD-7     9 PHQ-9     7   Assessment: Patient currently experiencing see above.   Patient may benefit from see above.  Plan: Follow up with behavioral health clinician on : 06/20/2021 at 9:00 AM Behavioral recommendations:  Referral(s): [place] (In Clinic) "From scale of 1-10, how likely are you to follow plan?":   Lesli Albee, LCSWA

## 2021-06-08 ENCOUNTER — Ambulatory Visit: Payer: Medicaid Other | Admitting: Adult Health

## 2021-06-08 ENCOUNTER — Encounter: Payer: Self-pay | Admitting: Gerontology

## 2021-06-08 ENCOUNTER — Other Ambulatory Visit: Payer: Self-pay

## 2021-06-08 ENCOUNTER — Telehealth: Payer: Self-pay | Admitting: Gerontology

## 2021-06-08 VITALS — BP 132/92 | HR 75 | Temp 97.5°F | Resp 16 | Ht 69.0 in | Wt 282.2 lb

## 2021-06-08 DIAGNOSIS — N1831 Chronic kidney disease, stage 3a: Secondary | ICD-10-CM

## 2021-06-08 DIAGNOSIS — N401 Enlarged prostate with lower urinary tract symptoms: Secondary | ICD-10-CM

## 2021-06-08 DIAGNOSIS — R7303 Prediabetes: Secondary | ICD-10-CM

## 2021-06-08 DIAGNOSIS — I10 Essential (primary) hypertension: Secondary | ICD-10-CM

## 2021-06-08 DIAGNOSIS — R351 Nocturia: Secondary | ICD-10-CM

## 2021-06-08 MED ORDER — HYDROCHLOROTHIAZIDE 12.5 MG PO CAPS
12.5000 mg | ORAL_CAPSULE | Freq: Every day | ORAL | 2 refills | Status: DC
  Filled 2021-06-08: qty 90, 90d supply, fill #0

## 2021-06-08 MED ORDER — RIGHTEST GS550 BLOOD GLUCOSE VI STRP
ORAL_STRIP | 99 refills | Status: DC
  Filled 2021-06-08: qty 100, 100d supply, fill #0

## 2021-06-08 MED ORDER — LOSARTAN POTASSIUM 100 MG PO TABS
100.0000 mg | ORAL_TABLET | Freq: Every day | ORAL | 2 refills | Status: DC
  Filled 2021-06-08: qty 90, 90d supply, fill #0

## 2021-06-08 MED ORDER — BLOOD GLUCOSE MONITOR SYSTEM W/DEVICE KIT
1.0000 | PACK | 0 refills | Status: DC
  Filled 2021-06-08: qty 1, 30d supply, fill #0

## 2021-06-08 MED ORDER — RIGHTEST GL300 LANCETS MISC
99 refills | Status: DC
  Filled 2021-06-08: qty 100, 100d supply, fill #0

## 2021-06-08 NOTE — Telephone Encounter (Signed)
Left message for patient to return call to the office about his active [facility] Medicaid [facility] coverage. Pt will be ineligible to receive care at [facility]. Patient will need to seek a new PCP.

## 2021-06-08 NOTE — Progress Notes (Unsigned)
  Patient: Anthony Lam Male    DOB: 13-Dec-1965   55 y.o.   MRN: [id number] Visit Date: 06/08/2021  Today's Provider: Deforest Hoyles, NP   Chief Complaint  Patient presents with   Follow-up   Hypertension   Diabetes    Pre-diabetes. Patient needs training on how to use Ozempic   Subjective:    HPI 55 Y/O male with a h/o prediabetes and hypertension seen for routine follow-up. He has not checked his BP at home in the last couple of days    Allergies  Allergen Reactions   Metformin And Related Diarrhea   Previous Medications   AMLODIPINE (NORVASC) 10 MG TABLET    TAKE ONE TABLET BY MOUTH EVERY DAY   FLUTICASONE (FLONASE) 50 MCG/ACT NASAL SPRAY    SPRAY 1 SPRAY IN EACH NOSTRIL ONCE DAILY.   GABAPENTIN (NEURONTIN) 100 MG CAPSULE    TAKE ONE CAPSULE BY MOUTH 3 TIMES A DAY   LIDOCAINE (LIDODERM) 5 %    APPLY PATCH DAILY AS DIRECTED AND LEAVE ON FOR ONLY 12 HOURS THEN REMOVE.   LOSARTAN (COZAAR) 50 MG TABLET    Take 1 AND (1/2) tablets (75 mg total) by mouth once daily.   SEMAGLUTIDE,0.25 OR 0.'5MG'$ /DOS, (OZEMPIC, 0.25 OR 0.5 MG/DOSE,) 2 MG/1.5ML SOPN    Inject 0.25 mg into the skin once a week.   SODIUM CHLORIDE (OCEAN) 0.65 % SOLN NASAL SPRAY    Spray 1 spray into both nostrils daily as needed for congestion.    Review of Systems  Social History   Tobacco Use   Smoking status: Some Days    Years: 15.00    Types: Cigarettes   Smokeless tobacco: Never   Tobacco comments:    The only time he smokes is when he has a beer 1 cigarette. 1 pack of cigarettes lasts ~2 weeks.  Substance Use Topics   Alcohol use: Yes    Alcohol/week: 1.0 standard drink    Types: 1 Cans of beer per week    Comment: 1-2 beers a week   Objective:   BP (!) 132/92 (BP Location: Left Arm, Patient Position: Sitting, Cuff Size: Large)   Pulse 75   Temp (!) 97.5 F (36.4 C)   Resp 16   Ht '5\' 9"'$  (1.753 m)   Wt 282 lb 3.2 oz (128 kg)   SpO2 95%   BMI 41.67 kg/m   Physical Exam       Assessment & Plan:           Deforest Hoyles, NP   [facility]

## 2021-06-10 LAB — COMPREHENSIVE METABOLIC PANEL
ALT: 15 IU/L (ref 0–44)
AST: 22 IU/L (ref 0–40)
Albumin/Globulin Ratio: 1.4 (ref 1.2–2.2)
Albumin: 4.4 g/dL (ref 3.8–4.9)
Alkaline Phosphatase: 88 IU/L (ref 44–121)
BUN/Creatinine Ratio: 11 (ref 9–20)
BUN: 17 mg/dL (ref 6–24)
Bilirubin Total: 0.3 mg/dL (ref 0.0–1.2)
CO2: 22 mmol/L (ref 20–29)
Calcium: 9.6 mg/dL (ref 8.7–10.2)
Chloride: 104 mmol/L (ref 96–106)
Creatinine, Ser: 1.48 mg/dL — ABNORMAL HIGH (ref 0.76–1.27)
Globulin, Total: 3.1 g/dL (ref 1.5–4.5)
Glucose: 84 mg/dL (ref 65–99)
Potassium: 4.1 mmol/L (ref 3.5–5.2)
Sodium: 140 mmol/L (ref 134–144)
Total Protein: 7.5 g/dL (ref 6.0–8.5)
eGFR: 55 mL/min/{1.73_m2} — ABNORMAL LOW (ref >59)

## 2021-06-10 LAB — LIPID PANEL
Chol/HDL Ratio: 4.1 ratio (ref 0.0–5.0)
Cholesterol, Total: 167 mg/dL (ref 100–199)
HDL: 41 mg/dL (ref >39)
LDL Chol Calc (NIH): 105 mg/dL — ABNORMAL HIGH (ref 0–99)
Triglycerides: 115 mg/dL (ref 0–149)
VLDL Cholesterol Cal: 21 mg/dL (ref 5–40)

## 2021-06-10 LAB — MICROALBUMIN / CREATININE URINE RATIO
Creatinine, Urine: 259.3 mg/dL
Microalb/Creat Ratio: 14 mg/g creat (ref 0–29)
Microalbumin, Urine: 35.9 ug/mL

## 2021-06-10 LAB — PSA: Prostate Specific Ag, Serum: 0.6 ng/mL (ref 0.0–4.0)

## 2021-06-15 DIAGNOSIS — Z419 Encounter for procedure for purposes other than remedying health state, unspecified: Secondary | ICD-10-CM | POA: Diagnosis not present

## 2021-06-20 ENCOUNTER — Ambulatory Visit: Payer: Medicaid Other | Admitting: Licensed Clinical Social Worker

## 2021-06-20 ENCOUNTER — Ambulatory Visit: Payer: Self-pay | Admitting: Licensed Clinical Social Worker

## 2021-06-20 ENCOUNTER — Other Ambulatory Visit: Payer: Self-pay

## 2021-06-20 DIAGNOSIS — F339 Major depressive disorder, recurrent, unspecified: Secondary | ICD-10-CM

## 2021-06-20 DIAGNOSIS — F419 Anxiety disorder, unspecified: Secondary | ICD-10-CM

## 2021-06-20 NOTE — BH Specialist Note (Signed)
Integrated Behavioral Health Follow Up Telephone Visit  MRN: [id number] Name: JERUSALEM WERT   Total time: 60 minutes  Types of Service: Telephone visit Patient consents to telephone visit and 2 patient identifiers were used to identify patient   Interpretor:No. Interpretor Name and Language: N/A  Subjective: ARIZ TERRONES is a 55 y.o. male accompanied by  Himself Patient was referred by Carlyon Shadow for [place]. Patient reports the following symptoms/concerns: The patient reports that he has been doing well since his last follow-up appointment. He explained that his Landlord fixed the leak that flooded his kitchen but not the cabinets that were damaged. He discussed other stressors impacting his life. He noted that he has Medicaid now and is waiting to call his new provider until next week. He shared that he feels overall relieved to know his medical expenses are covered.The patient noted that he is sleeping well however, has low energy throughout the day. He stated that he felt down and depressed several days out of the week. The patient denied any panic attacks or suicidal or homicidal thoughts. Mr. Maxson noted that he will follow up on the referrals for mental health and call if he needs further assistance..  Duration of problem: Years; Severity of problem: moderate  Objective: Mood: Euthymic and Affect: Appropriate Risk of harm to self or others: No plan to harm self or others  Life Context: Family and Social: see above School/Work: see above Self-Care: see above Life Changes: see above  Patient and/or Family's Strengths/Protective Factors: Concrete supports in place (healthy food, safe environments, etc.)  Goals Addressed: Patient will:  Reduce symptoms of: anxiety, depression, insomnia, and stress   Increase knowledge and/or ability of: coping skills, healthy habits, self-management skills, and stress reduction   Demonstrate ability to: Increase healthy  adjustment to current life circumstances and Increase adequate support systems for patient/family  Progress towards Goals: Ongoing  Interventions: Interventions utilized:  Supportive Counselingwas utilized by the clinician during today's follow up session. The clinician processed with the patient how they have been doing since the last follow-up session. The clinician provided a space for the patient to ventilate their frustrations regarding their current life circumstances. Clinician measured the patient's anxiety and depression on a numerical scale.Clinician met with the client via the telephone to identify needs related to stressors and functioning, access and monitor for signs and symptoms of anxiety and depression and assess for safety. Clinician reassured the patient that he would receive a follow-up call to make sure the transition to a new provider was going well. Clinician provided the patient with referrals to other mental health providers who accept Medicaid.   Clinician Standardized Assessments completed: GAD-7 and PHQ 9 GAD-7    10 PHQ-9      9  Assessment: Patient currently experiencing see above.   Patient may benefit from see above.  Plan: Follow up with behavioral health clinician on : N/A Behavioral recommendations:  Referral(s): [place] (LME/Outside Clinic) Referrals provided to [facility],  [facility], and [facility]  "From scale of 1-10, how likely are you to follow plan?":   Lesli Albee, LCSWA

## 2021-06-28 ENCOUNTER — Other Ambulatory Visit: Payer: Self-pay

## 2021-06-28 ENCOUNTER — Other Ambulatory Visit: Payer: Self-pay | Admitting: Gerontology

## 2021-06-28 DIAGNOSIS — R7303 Prediabetes: Secondary | ICD-10-CM

## 2021-06-29 ENCOUNTER — Other Ambulatory Visit: Payer: Self-pay

## 2021-06-30 ENCOUNTER — Other Ambulatory Visit: Payer: Self-pay

## 2021-07-03 ENCOUNTER — Other Ambulatory Visit: Payer: Self-pay

## 2021-07-05 ENCOUNTER — Other Ambulatory Visit: Payer: Self-pay | Admitting: Gerontology

## 2021-07-05 DIAGNOSIS — R7303 Prediabetes: Secondary | ICD-10-CM

## 2021-07-05 MED ORDER — OZEMPIC (0.25 OR 0.5 MG/DOSE) 2 MG/1.5ML ~~LOC~~ SOPN: 0.2500 mg | PEN_INJECTOR | SUBCUTANEOUS | 0 refills | Status: DC

## 2021-07-12 ENCOUNTER — Ambulatory Visit: Payer: Medicaid Other | Admitting: Gerontology

## 2021-07-15 DIAGNOSIS — Z419 Encounter for procedure for purposes other than remedying health state, unspecified: Secondary | ICD-10-CM | POA: Diagnosis not present

## 2021-07-21 ENCOUNTER — Telehealth: Payer: Self-pay | Admitting: Pharmacy Technician

## 2021-07-21 NOTE — Telephone Encounter (Signed)
Patient has prescription drug coverage with [facility] Medicaid.  No longer meets [facility]'s eligibility criteria.  [place], [place]  [address]   July 20, 2021    [address], [place]  [address]  Dear Anthony Lam:  This is to inform you that you are no longer eligible to receive medication assistance at [facility].  The reason(s) are:    _____Your total gross monthly household income exceeds 250% of the Federal Poverty Level.   _____Tangible assets (savings, checking, stocks/bonds, pension, retirement, etc.) exceeds our limit  __X__You are eligible to receive benefits from [facility], [facility] or HIV Medication            Assistance program _____You are eligible to receive benefits from a Medicare Part "D" plan __X__You have prescription insurance with [facility] Medicaid _____You are not an [facility] resident _____Failure to provide all requested documentation (proof of income information for 2022., and/or Patient Intake Application, [place], Contract, etc).    We regret that we are unable to help you at this time.  If your prescription coverage is terminated, please contact [facility], so that we may reassess your eligibility for our program.  If you have questions, we may be contacted at [phone].  Thank you,  [facility]

## 2021-08-01 ENCOUNTER — Telehealth: Payer: Self-pay | Admitting: Emergency Medicine

## 2021-08-01 NOTE — Telephone Encounter (Signed)
Received fax from [facility] re: referral for patient to their clinic. They have contacted patient and patient has declined the referral. Referral has been closed.

## 2021-08-07 ENCOUNTER — Ambulatory Visit (INDEPENDENT_AMBULATORY_CARE_PROVIDER_SITE_OTHER): Payer: Medicaid Other | Admitting: Nurse Practitioner

## 2021-08-07 ENCOUNTER — Encounter: Payer: Self-pay | Admitting: Nurse Practitioner

## 2021-08-07 ENCOUNTER — Other Ambulatory Visit: Payer: Self-pay

## 2021-08-07 VITALS — BP 148/105 | HR 88 | Temp 97.8°F | Resp 18 | Ht 70.0 in | Wt 275.0 lb

## 2021-08-07 DIAGNOSIS — E118 Type 2 diabetes mellitus with unspecified complications: Secondary | ICD-10-CM | POA: Insufficient documentation

## 2021-08-07 DIAGNOSIS — E119 Type 2 diabetes mellitus without complications: Secondary | ICD-10-CM

## 2021-08-07 DIAGNOSIS — R7303 Prediabetes: Secondary | ICD-10-CM

## 2021-08-07 DIAGNOSIS — N1831 Chronic kidney disease, stage 3a: Secondary | ICD-10-CM

## 2021-08-07 DIAGNOSIS — E559 Vitamin D deficiency, unspecified: Secondary | ICD-10-CM

## 2021-08-07 DIAGNOSIS — Z7689 Persons encountering health services in other specified circumstances: Secondary | ICD-10-CM

## 2021-08-07 DIAGNOSIS — I10 Essential (primary) hypertension: Secondary | ICD-10-CM | POA: Diagnosis not present

## 2021-08-07 MED ORDER — OZEMPIC (0.25 OR 0.5 MG/DOSE) 2 MG/1.5ML ~~LOC~~ SOPN: 0.2500 mg | PEN_INJECTOR | SUBCUTANEOUS | 0 refills | Status: DC

## 2021-08-07 MED ORDER — HYDROCHLOROTHIAZIDE 25 MG PO TABS: 25.0000 mg | ORAL_TABLET | Freq: Every day | ORAL | 0 refills | Status: DC

## 2021-08-07 NOTE — Assessment & Plan Note (Signed)
Chronic.  Unontrolled.  Will increase HCTZ from 12.5mg  to 25mg .  Continue with Amlodipine 10mg  and Losartan 100mg . Labs ordered today.  Return to clinic in 1 months for reevaluation.  Continue to check blood pressure at home.  Call sooner if concerns arise.

## 2021-08-07 NOTE — Assessment & Plan Note (Signed)
Chronic. Likely etiology is uncontrolled Hypertension.  Patient is followed by Nephrology.  It is recommended that patient continue on GLP1 for kidney protection and diabetes control. Follow up in 1 month.

## 2021-08-07 NOTE — Assessment & Plan Note (Signed)
Labs ordered today.  Will make recommendations based on lab results. ?

## 2021-08-07 NOTE — Progress Notes (Signed)
BP (!) 148/105 (BP Location: Left Arm, Patient Position: Sitting)   Pulse 88   Temp 97.8 F (36.6 C) (Oral)   Resp 18   Ht '5\' 10"'  (1.778 m)   Wt 275 lb (124.7 kg)   SpO2 95%   BMI 39.46 kg/m    Subjective:    Patient ID: Anthony Lam, male    DOB: 1966/04/30, 55 y.o.   MRN: [id number]  HPI: Anthony Lam is a 55 y.o. male  Chief Complaint  Patient presents with   Establish Care   Patient presents to clinic to establish care with new PCP.  He has previously been seen by the [facility].   However, he now has insurance and has transferred his care to our office.  Patient reports a history of hypertension, CKD stage 3, sleep apnea, prediabetes, anxiety, and a vitamin D deficiency.  Patient denies a history of: Elevated Cholesterol,Thyroid problems, Depression, Neurological problems, and Abdominal problems.   PREDIABETES Patient is on Ozempic for prediabetes as well as his CKD. It was recommended by the Nephrologist.  He had diarrhea with the Metformin.   HYPERTENSION Patient has had a long standing history of HTN.  CKD is caused by uncontrolled HTN.  Patient has been on Amlodipine, losartan, HCTZ.  Patient was at the gym prior to coming here. He checked his blood pressure at home this morning and it was 130/90.  BACK PAIN Patient takes Gabapentin for lower back pain.  He has an appt with [facility] next month.  He will have a "burn out stent" to help with the back pain. Patient takes the Gabapentin 178m nightly to help with back pain at night.   SLEEP APNEA [place]  Patient has a CPAP and uses it every night. Patient states he is fatigued in the mornings.    Denies HA, CP, SOB, dizziness, palpitations, visual changes, and lower extremity swelling.  Active Ambulatory Problems    Diagnosis Date Noted   Benign hypertensive renal disease 05/11/2015   Sleep apnea 09/07/2015   CKD (chronic kidney disease) stage 3, GFR 30-59 ml/min  (HCC) 07/10/2018   BPH (benign prostatic hyperplasia) 08/07/2018   Back pain 08/28/2018   Essential hypertension 05/19/2019   Chronic back pain 05/19/2019   Vitamin D deficiency 02/24/2020   Anxiety 09/15/2020   Chronic pain of right knee 12/14/2020   Fluttering sensation of heart 04/25/2021   Complaint of nasal congestion 04/25/2021   [facility]    Resolved Ambulatory Problems    Diagnosis Date Noted   No Resolved Ambulatory Problems   Past Medical History:  Diagnosis Date   Chronic kidney disease    Hypertension    Migraines    [place]    TB (tuberculosis), treated 2009   Past Surgical History:  Procedure Laterality Date   APPENDECTOMY     COLONOSCOPY WITH PROPOFOL N/A 01/26/2020   Procedure: COLONOSCOPY WITH PROPOFOL;  Surgeon: AJonathon Bellows MD;  [facility]  Service: Gastroenterology;  Laterality: N/A;   Family History  Problem Relation Age of Onset   Hypertension Mother    Constipation Father        chronic   Alzheimer's disease Father    Deep vein thrombosis Sister      Review of Systems  Eyes:  Negative for visual disturbance.  Respiratory:  Negative for shortness of breath.   Cardiovascular:  Negative for chest pain and leg  swelling.  Neurological:  Negative for light-headedness and headaches.   Per HPI unless specifically indicated above     Objective:    BP (!) 148/105 (BP Location: Left Arm, Patient Position: Sitting)   Pulse 88   Temp 97.8 F (36.6 C) (Oral)   Resp 18   Ht '5\' 10"'  (1.778 m)   Wt 275 lb (124.7 kg)   SpO2 95%   BMI 39.46 kg/m   Wt Readings from Last 3 Encounters:  08/07/21 275 lb (124.7 kg)  06/08/21 282 lb 3.2 oz (128 kg)  04/25/21 288 lb 8 oz (130.9 kg)    Physical Exam Vitals and nursing note reviewed.  Constitutional:      General: He is not in acute distress.    Appearance: Normal appearance. He is obese. He is not ill-appearing, toxic-appearing or diaphoretic.   HENT:     Head: Normocephalic.     Right Ear: External ear normal.     Left Ear: External ear normal.     Nose: Nose normal. No congestion or rhinorrhea.     Mouth/Throat:     Mouth: Mucous membranes are moist.  Eyes:     General:        Right eye: No discharge.        Left eye: No discharge.     Extraocular Movements: Extraocular movements intact.     Conjunctiva/sclera: Conjunctivae normal.     Pupils: Pupils are equal, round, and reactive to light.  Cardiovascular:     Rate and Rhythm: Normal rate and regular rhythm.     Heart sounds: No murmur heard. Pulmonary:     Effort: Pulmonary effort is normal. No respiratory distress.     Breath sounds: Normal breath sounds. No wheezing, rhonchi or rales.  Abdominal:     General: Abdomen is flat. Bowel sounds are normal.  Musculoskeletal:     Cervical back: Normal range of motion and neck supple.  Skin:    General: Skin is warm and dry.     Capillary Refill: Capillary refill takes less than 2 seconds.  Neurological:     General: No focal deficit present.     Mental Status: He is alert and oriented to person, place, and time.  Psychiatric:        Mood and Affect: Mood normal.        Behavior: Behavior normal.        Thought Content: Thought content normal.        Judgment: Judgment normal.    Results for orders placed or performed in visit on 06/08/21  Comp Met (CMET)  Result Value Ref Range   Glucose 84 65 - 99 mg/dL   BUN 17 6 - 24 mg/dL   Creatinine, Ser 1.48 (H) 0.76 - 1.27 mg/dL   eGFR 55 (L) >59 mL/min/1.73   BUN/Creatinine Ratio 11 9 - 20   Sodium 140 134 - 144 mmol/L   Potassium 4.1 3.5 - 5.2 mmol/L   Chloride 104 96 - 106 mmol/L   CO2 22 20 - 29 mmol/L   Calcium 9.6 8.7 - 10.2 mg/dL   Total Protein 7.5 6.0 - 8.5 g/dL   Albumin 4.4 3.8 - 4.9 g/dL   Globulin, Total 3.1 1.5 - 4.5 g/dL   Albumin/Globulin Ratio 1.4 1.2 - 2.2   Bilirubin Total 0.3 0.0 - 1.2 mg/dL   Alkaline Phosphatase 88 44 - 121 IU/L   AST 22 0  - 40 IU/L   ALT 15 0 -  44 IU/L  Lipid Profile  Result Value Ref Range   Cholesterol, Total 167 100 - 199 mg/dL   Triglycerides 115 0 - 149 mg/dL   HDL 41 >39 mg/dL   VLDL Cholesterol Cal 21 5 - 40 mg/dL   LDL Chol Calc (NIH) 105 (H) 0 - 99 mg/dL   Chol/HDL Ratio 4.1 0.0 - 5.0 ratio  PSA  Result Value Ref Range   Prostate Specific Ag, Serum 0.6 0.0 - 4.0 ng/mL  Urine Microalbumin w/creat. ratio  Result Value Ref Range   Creatinine, Urine 259.3 Not Estab. mg/dL   Microalbumin, Urine 35.9 Not Estab. ug/mL   Microalb/Creat Ratio 14 0 - 29 mg/g creat      Assessment & Plan:   Problem List Items Addressed This Visit       Cardiovascular and Mediastinum   Essential hypertension    Chronic.  Unontrolled.  Will increase HCTZ from 12.80m to 268m  Continue with Amlodipine 1063mnd Losartan 100m19mabs ordered today.  Return to clinic in 1 months for reevaluation.  Continue to check blood pressure at home.  Call sooner if concerns arise.        Relevant Medications   hydrochlorothiazide (HYDRODIURIL) 25 MG tablet   Other Relevant Orders   Comp Met (CMET)   HgB A1c   Lipid Profile     Endocrine   Diabetes mellitus without complication (HCC)    Chronic.  Controlled.  Continue with current medication regimen on Ozempic weekly.  Labs ordered today.  Return to clinic in 1 months for reevaluation.  Call sooner if concerns arise.        Relevant Medications   Semaglutide,0.25 or 0.5MG/DOS, (OZEMPIC, 0.25 OR 0.5 MG/DOSE,) 2 MG/1.5ML SOPN     Genitourinary   CKD (chronic kidney disease) stage 3, GFR 30-59 ml/min (HCC) - Primary    Chronic. Likely etiology is uncontrolled Hypertension.  Patient is followed by Nephrology.  It is recommended that patient continue on GLP1 for kidney protection and diabetes control. Follow up in 1 month.       Relevant Orders   Comp Met (CMET)   HgB A1c   Lipid Profile     Other   Vitamin D deficiency    Labs ordered today. Will make recommendations  based on lab results.       Relevant Orders   Vitamin D (25 hydroxy)   Other Visit Diagnoses     Prediabetes       Relevant Medications   Semaglutide,0.25 or 0.5MG/DOS, (OZEMPIC, 0.25 OR 0.5 MG/DOSE,) 2 MG/1.5ML SOPN   Other Relevant Orders   Comp Met (CMET)   HgB A1c   Lipid Profile   Encounter to establish care            Follow up plan: Return in about 1 month (around 09/07/2021) for Physical and Fasting labs and blood pressure check.

## 2021-08-07 NOTE — Assessment & Plan Note (Signed)
Chronic.  Controlled.  Continue with current medication regimen on Ozempic weekly.  Labs ordered today.  Return to clinic in 1 months for reevaluation.  Call sooner if concerns arise.

## 2021-08-08 ENCOUNTER — Telehealth: Payer: Self-pay | Admitting: Nurse Practitioner

## 2021-08-08 LAB — LIPID PANEL
Chol/HDL Ratio: 4.4 ratio (ref 0.0–5.0)
Cholesterol, Total: 186 mg/dL (ref 100–199)
HDL: 42 mg/dL (ref >39)
LDL Chol Calc (NIH): 113 mg/dL — ABNORMAL HIGH (ref 0–99)
Triglycerides: 175 mg/dL — ABNORMAL HIGH (ref 0–149)
VLDL Cholesterol Cal: 31 mg/dL (ref 5–40)

## 2021-08-08 LAB — COMPREHENSIVE METABOLIC PANEL
ALT: 16 IU/L (ref 0–44)
AST: 16 IU/L (ref 0–40)
Albumin/Globulin Ratio: 1.6 (ref 1.2–2.2)
Albumin: 4.7 g/dL (ref 3.8–4.9)
Alkaline Phosphatase: 104 IU/L (ref 44–121)
BUN/Creatinine Ratio: 11 (ref 9–20)
BUN: 18 mg/dL (ref 6–24)
Bilirubin Total: 0.4 mg/dL (ref 0.0–1.2)
CO2: 21 mmol/L (ref 20–29)
Calcium: 10.1 mg/dL (ref 8.7–10.2)
Chloride: 102 mmol/L (ref 96–106)
Creatinine, Ser: 1.67 mg/dL — ABNORMAL HIGH (ref 0.76–1.27)
Globulin, Total: 3 g/dL (ref 1.5–4.5)
Glucose: 106 mg/dL — ABNORMAL HIGH (ref 70–99)
Potassium: 4.2 mmol/L (ref 3.5–5.2)
Sodium: 139 mmol/L (ref 134–144)
Total Protein: 7.7 g/dL (ref 6.0–8.5)
eGFR: 48 mL/min/{1.73_m2} — ABNORMAL LOW (ref >59)

## 2021-08-08 LAB — VITAMIN D 25 HYDROXY (VIT D DEFICIENCY, FRACTURES): Vit D, 25-Hydroxy: 24.6 ng/mL — ABNORMAL LOW (ref 30.0–100.0)

## 2021-08-08 LAB — HEMOGLOBIN A1C
Est. average glucose Bld gHb Est-mCnc: 117 mg/dL
Hgb A1c MFr Bld: 5.7 % — ABNORMAL HIGH (ref 4.8–5.6)

## 2021-08-08 NOTE — Progress Notes (Signed)
Please let patient know that his lab work remains stable.  Continue to avoid NSAIDS to help protect his kidneys.  A1c remains well controlled at 5.7.  Patient's vitamin D is low.  I recommend he start 1000IU of vitamin D over the counter.  We will recheck this at future visits. Follow up as discussed.

## 2021-08-08 NOTE — Telephone Encounter (Signed)
I do not know what this message means that I am not in the system to sign off the medication.  Please call the pharmacy and find out what the problem is so we can get it corrected.    Please call patient and let him know we are working on this.

## 2021-08-08 NOTE — Telephone Encounter (Signed)
Copied from [place] [id number]. Topic: Quick Communication - Rx Refill/Question >> Aug 08, 2021  9:06 AM Loma Boston wrote: Medication: Semaglutide,0.25 or 0.5MG /DOS, (OZEMPIC, 0.25 OR 0.5 MG/DOSE,) 2 MG/1.5ML SOPN 6 mL 0 08/07/2021   Sig - Route: Inject 0.25 mg into the skin once a week. - Subcutaneous  Sent to pharmacy as: Semaglutide,0.25 or 0.5MG /DOS, (OZEMPIC, 0.25 OR 0.5 MG/DOSE,) 2 MG/1.5ML Solution Pen-injector  E-Prescribing Status: Receipt confirmed by pharmacy (08/07/2021 2:55 PM EDT   Pt has called stating that he had appt with [facility] yesterday as his Medicare/Medicaid dr and she confirmed she was in the system, he states that he got one medication but when he was to pick up the above they stated that she is not in the system to sign off on this medication. He states that Medicare has faxed over the proper forms to be completed to further the process but he wants someone to reach out as if this take time he feels he needs something liken to this med. Pt wants a fu cb to assure that forms were received and being processed and that he can have a med as a stand in. He states he is accustom to this med and does not feel like himself without it, offered NT to speak with him but he states he is fine and actually had a full discussion with the dr about how he felt off the med and declined any further clinical input. FU at [phone] leave a message if no pu   Has the patient contacted their pharmacy? Yes.   (Agent: If no, request that the patient contact the pharmacy for the refill. If patient does not wish to contact the pharmacy document the reason why and proceed with request.) see above (Agent: If yes, when and what did the pharmacy advise?)  Preferred Pharmacy (with phone number or street name): [id number] [address], [place] [address] Phone: [phone] Fax: [phone] Hours: Not  open 24 hours   Has the patient been seen for an appointment in the last year OR does the patient have an upcoming appointment? Yes.    Agent: Please be advised that RX refills may take up to 3 business days. We ask that you follow-up with your pharmacy.

## 2021-08-09 NOTE — Telephone Encounter (Signed)
Contacted [facility] and discussed with them. PA is needed for the patient's Ozempic   PA initiated and submitted via Cover My Meds. Key: [id number]

## 2021-08-09 NOTE — Telephone Encounter (Signed)
PA approved for Ozempic. Called and notified patient that medication was approved.

## 2021-08-15 DIAGNOSIS — Z419 Encounter for procedure for purposes other than remedying health state, unspecified: Secondary | ICD-10-CM | POA: Diagnosis not present

## 2021-08-22 ENCOUNTER — Other Ambulatory Visit: Payer: Self-pay | Admitting: Nurse Practitioner

## 2021-08-22 DIAGNOSIS — I10 Essential (primary) hypertension: Secondary | ICD-10-CM

## 2021-08-22 MED ORDER — AMLODIPINE BESYLATE 10 MG PO TABS: ORAL_TABLET | Freq: Every day | ORAL | 1 refills | Status: DC

## 2021-08-22 NOTE — Telephone Encounter (Signed)
Copied from [place] [id number]. Topic: Quick Communication - Rx Refill/Question >> Aug 22, 2021 11:03 AM Tessa Lerner A wrote: Medication: amLODipine (NORVASC) 10 MG tablet [[id number]]   Has the patient contacted their pharmacy? Yes.  The patient was directed to contact their PCP (Agent: If no, request that the patient contact the pharmacy for the refill. If patient does not wish to contact the pharmacy document the reason why and proceed with request.) (Agent: If yes, when and what did the pharmacy advise?)  Preferred Pharmacy (with phone number or street name): [id number], [place]  Phone:  [phone] Fax:  [phone]  Has the patient been seen for an appointment in the last year OR does the patient have an upcoming appointment? Yes.    Agent: Please be advised that RX refills may take up to 3 business days. We ask that you follow-up with your pharmacy.

## 2021-08-22 NOTE — Telephone Encounter (Signed)
Requested Prescriptions  Pending Prescriptions Disp Refills  . amLODipine (NORVASC) 10 MG tablet 90 tablet 1    Sig: TAKE ONE TABLET BY MOUTH EVERY DAY     Cardiovascular:  Calcium Channel Blockers Failed - 08/22/2021  5:54 PM      Failed - Last BP in normal range    BP Readings from Last 1 Encounters:  08/07/21 (!) 148/105         Passed - Valid encounter within last 6 months    Recent Outpatient Visits          2 weeks ago Stage 3a chronic kidney disease ([place])   [place] Jon Billings, NP      Future Appointments            In 3 weeks Jon Billings, NP [facility], [place]

## 2021-09-06 ENCOUNTER — Other Ambulatory Visit: Payer: Self-pay | Admitting: Nurse Practitioner

## 2021-09-06 NOTE — Telephone Encounter (Signed)
Appointment 09/13/21 Requested Prescriptions  Pending Prescriptions Disp Refills  . hydrochlorothiazide (HYDRODIURIL) 25 MG tablet [Pharmacy Med Name: HYDROCHLOROTHIAZIDE 25MG  TABLETS] 30 tablet 0    Sig: TAKE 1 TABLET(25 MG) BY MOUTH DAILY     Cardiovascular: Diuretics - Thiazide Failed - 09/06/2021  8:45 AM      Failed - Cr in normal range and within 360 days    Creatinine  Date Value Ref Range Status  08/04/2013 1.68 (H) 0.60 - 1.30 mg/dL Final   Creatinine, Ser  Date Value Ref Range Status  08/07/2021 1.67 (H) 0.76 - 1.27 mg/dL Final         Failed - Last BP in normal range    BP Readings from Last 1 Encounters:  08/07/21 (!) 148/105         Passed - Ca in normal range and within 360 days    Calcium  Date Value Ref Range Status  08/07/2021 10.1 8.7 - 10.2 mg/dL Final   Calcium, Total  Date Value Ref Range Status  08/04/2013 9.2 8.5 - 10.1 mg/dL Final         Passed - K in normal range and within 360 days    Potassium  Date Value Ref Range Status  08/07/2021 4.2 3.5 - 5.2 mmol/L Final  08/04/2013 3.6 3.5 - 5.1 mmol/L Final         Passed - Na in normal range and within 360 days    Sodium  Date Value Ref Range Status  08/07/2021 139 134 - 144 mmol/L Final  08/04/2013 135 (L) 136 - 145 mmol/L Final         Passed - Valid encounter within last 6 months    Recent Outpatient Visits          1 month ago Stage 3a chronic kidney disease ([place])   [place] Jon Billings, NP      Future Appointments            In 1 week Jon Billings, NP [facility], [place]

## 2021-09-13 ENCOUNTER — Encounter: Payer: Medicaid Other | Admitting: Nurse Practitioner

## 2021-09-14 ENCOUNTER — Ambulatory Visit: Payer: Medicaid Other | Admitting: Gerontology

## 2021-09-14 DIAGNOSIS — Z419 Encounter for procedure for purposes other than remedying health state, unspecified: Secondary | ICD-10-CM | POA: Diagnosis not present

## 2021-09-22 DIAGNOSIS — N1831 Chronic kidney disease, stage 3a: Secondary | ICD-10-CM | POA: Diagnosis not present

## 2021-09-22 DIAGNOSIS — I10 Essential (primary) hypertension: Secondary | ICD-10-CM | POA: Diagnosis not present

## 2021-09-25 DIAGNOSIS — N1831 Chronic kidney disease, stage 3a: Secondary | ICD-10-CM | POA: Diagnosis not present

## 2021-09-25 DIAGNOSIS — M899 Disorder of bone, unspecified: Secondary | ICD-10-CM | POA: Diagnosis not present

## 2021-09-25 DIAGNOSIS — N189 Chronic kidney disease, unspecified: Secondary | ICD-10-CM | POA: Diagnosis not present

## 2021-09-25 DIAGNOSIS — I10 Essential (primary) hypertension: Secondary | ICD-10-CM | POA: Diagnosis not present

## 2021-09-25 DIAGNOSIS — D631 Anemia in chronic kidney disease: Secondary | ICD-10-CM | POA: Diagnosis not present

## 2021-09-28 ENCOUNTER — Telehealth: Payer: Self-pay | Admitting: Nurse Practitioner

## 2021-09-28 DIAGNOSIS — G4730 Sleep apnea, unspecified: Secondary | ICD-10-CM

## 2021-09-28 NOTE — Telephone Encounter (Signed)
Pt CPAP supplies has expired on 12.6.22 and he called [facility] and they advised him to contact PCP/ they advised him to have [name] call them to reactivate the application for his Cpap at [phone]/ please advise

## 2021-09-29 NOTE — Telephone Encounter (Signed)
Relation to pt: self  Call back number: [phone]    Reason for call:  Patient would like a follow up call when orders have been sent to [facility] regarding his CPAP supplies.

## 2021-10-02 NOTE — Telephone Encounter (Signed)
Please call the phone number on the original message and find out what needs to be done to get him new supplies.

## 2021-10-02 NOTE — Telephone Encounter (Signed)
CPAP order print and waiting for provider's signature.

## 2021-10-02 NOTE — Telephone Encounter (Signed)
Monica from [facility] home care specialist called and asked if [name] can fax CPAP paperwork to her at Fax# [phone]/ please advise

## 2021-10-03 NOTE — Telephone Encounter (Signed)
Order faxed as requested. Called and notified patient that this was done for him

## 2021-10-15 DIAGNOSIS — Z419 Encounter for procedure for purposes other than remedying health state, unspecified: Secondary | ICD-10-CM | POA: Diagnosis not present

## 2021-10-23 ENCOUNTER — Other Ambulatory Visit: Payer: Self-pay

## 2021-10-23 ENCOUNTER — Encounter: Payer: Self-pay | Admitting: Nurse Practitioner

## 2021-10-23 ENCOUNTER — Ambulatory Visit (INDEPENDENT_AMBULATORY_CARE_PROVIDER_SITE_OTHER): Payer: Medicaid Other | Admitting: Nurse Practitioner

## 2021-10-23 VITALS — BP 115/73 | HR 80 | Temp 98.6°F | Ht 67.8 in | Wt 274.2 lb

## 2021-10-23 DIAGNOSIS — Z114 Encounter for screening for human immunodeficiency virus [HIV]: Secondary | ICD-10-CM | POA: Diagnosis not present

## 2021-10-23 DIAGNOSIS — E119 Type 2 diabetes mellitus without complications: Secondary | ICD-10-CM | POA: Diagnosis not present

## 2021-10-23 DIAGNOSIS — Z0000 Encounter for general adult medical examination without abnormal findings: Secondary | ICD-10-CM | POA: Diagnosis not present

## 2021-10-23 DIAGNOSIS — Z1159 Encounter for screening for other viral diseases: Secondary | ICD-10-CM | POA: Diagnosis not present

## 2021-10-23 DIAGNOSIS — I10 Essential (primary) hypertension: Secondary | ICD-10-CM

## 2021-10-23 DIAGNOSIS — N1831 Chronic kidney disease, stage 3a: Secondary | ICD-10-CM

## 2021-10-23 DIAGNOSIS — Z23 Encounter for immunization: Secondary | ICD-10-CM

## 2021-10-23 DIAGNOSIS — E559 Vitamin D deficiency, unspecified: Secondary | ICD-10-CM

## 2021-10-23 LAB — URINALYSIS, ROUTINE W REFLEX MICROSCOPIC
Bilirubin, UA: NEGATIVE
Glucose, UA: NEGATIVE
Leukocytes,UA: NEGATIVE
Nitrite, UA: NEGATIVE
RBC, UA: NEGATIVE
Specific Gravity, UA: >1.03 — ABNORMAL HIGH (ref 1.005–1.030)
Urobilinogen, Ur: 0.2 mg/dL (ref 0.2–1.0)
pH, UA: 5.5 (ref 5.0–7.5)

## 2021-10-23 LAB — MICROSCOPIC EXAMINATION
Epithelial Cells (non renal): NONE SEEN /hpf (ref 0–10)
RBC, Urine: NONE SEEN /hpf (ref 0–2)
WBC, UA: NONE SEEN /hpf (ref 0–5)

## 2021-10-23 NOTE — Assessment & Plan Note (Signed)
Chronic.  Controlled.  Continue with current medication regimen of Amlodipine 10mg , HCTZ 25mg , and Losartan 100mg  daily.  Labs ordered today.  Return to clinic in 3 months for reevaluation.  Call sooner if concerns arise.

## 2021-10-23 NOTE — Progress Notes (Signed)
BP 115/73    Pulse 80    Temp 98.6 F (37 C) (Oral)    Ht 5' 7.8" (1.722 m)    Wt 274 lb 3.2 oz (124.4 kg)    SpO2 96%    BMI 41.94 kg/m    Subjective:    Patient ID: Anthony Lam, male    DOB: Aug 29, 1966, 56 y.o.   MRN: [id number]  HPI: Anthony Lam is a 56 y.o. male presenting on 10/23/2021 for comprehensive medical examination. Current medical complaints include:none  He currently lives with: Interim Problems from his last visit: no  HYPERTENSION Hypertension status: controlled  Satisfied with current treatment? no Duration of hypertension: years BP monitoring frequency:   using a cuff that is too small. Plans to get a new one. BP range:  BP medication side effects:  no Medication compliance: excellent compliance Previous BP meds:amlodipine, HCTZ, and losartan (cozaar) Aspirin: no Recurrent headaches: no Visual changes: no Palpitations: no Dyspnea: no Chest pain: no Lower extremity edema: no Dizzy/lightheaded: no  DIABETES Hypoglycemic episodes:no Polydipsia/polyuria: no Visual disturbance: no Chest pain: no Paresthesias: no Glucose Monitoring: no  Accucheck frequency: Not Checking  Fasting glucose:  Post prandial:  Evening:  Before meals: Taking Insulin?: no  Long acting insulin:  Short acting insulin: Blood Pressure Monitoring: not checking Retinal Examination: Not up to Date Foot Exam: Up to Date Diabetic Education: Not Completed Pneumovax: Up to Date Influenza: Up to Date Aspirin: no  CHRONIC KIDNEY DISEASE CKD status: controlled Medications renally dose: no Previous renal evaluation: no Pneumovax:  Up to Date Influenza Vaccine:  Up to Date  Depression Screen done today and results listed below:  Depression screen [facility] 2/9 10/23/2021 08/07/2021 06/20/2021 06/06/2021 05/23/2021  Decreased Interest 0 - 0 1 1  Down, Depressed, Hopeless 0 - _0 PHQ - 2 Score 0 - _1 Altered sleeping 1 0 _2 Tired, decreased energy _3 Change in  appetite 1 0 1 0 0  Feeling bad or failure about yourself  0 0 0 0 0  Trouble concentrating _4 Moving slowly or fidgety/restless 0 0 0 0 0  Suicidal thoughts 0 0 0 0 0  PHQ-9 Score 4 - _5 Difficult doing work/chores Not difficult at all - - Somewhat difficult Somewhat difficult  Some recent data might be hidden    The patient does not have a history of falls. I did complete a risk assessment for falls. A plan of care for falls was documented.   Past Medical History:  Past Medical History:  Diagnosis Date   Chronic kidney disease    stage 3a   Diabetes mellitus without complication ([place])    Hypertension    Migraines    Seizures ([place])    Sleep apnea    TB (tuberculosis), treated 2009    Surgical History:  Past Surgical History:  Procedure Laterality Date   APPENDECTOMY     COLONOSCOPY WITH PROPOFOL N/A 01/26/2020   Procedure: COLONOSCOPY WITH PROPOFOL;  Surgeon: Jonathon Bellows, MD;  Location: [facility] ENDOSCOPY;  Service: Gastroenterology;  Laterality: N/A;    Medications:  Current Outpatient Medications on File Prior to Visit  Medication Sig   amLODipine (NORVASC) 10 MG tablet TAKE ONE TABLET BY MOUTH EVERY DAY   Blood Glucose Monitoring Suppl (BLOOD GLUCOSE MONITOR SYSTEM) w/Device KIT USE AS DIRECTED.   fluticasone (FLONASE) 50 MCG/ACT nasal spray SPRAY  1 SPRAY IN EACH NOSTRIL ONCE DAILY.   gabapentin (NEURONTIN) 100 MG capsule TAKE ONE CAPSULE BY MOUTH 3 TIMES A DAY   glucose blood (RIGHTEST GS550 BLOOD GLUCOSE) test strip USE AS DIRECTED.   hydrochlorothiazide (HYDRODIURIL) 25 MG tablet TAKE 1 TABLET(25 MG) BY MOUTH DAILY   losartan (COZAAR) 100 MG tablet Take 1 tablet (100 mg total) by mouth once daily.   Rightest GL300 Lancets MISC USE AS DIRECTED.   Semaglutide,0.25 or 0.5MG/DOS, (OZEMPIC, 0.25 OR 0.5 MG/DOSE,) 2 MG/1.5ML SOPN Inject 0.25 mg into the skin once a week.   sodium chloride (OCEAN) 0.65 % SOLN nasal spray Spray 1 spray into both nostrils daily as  needed for congestion.   No current facility-administered medications on file prior to visit.    Allergies:  Allergies  Allergen Reactions   Metformin And Related Diarrhea    Social History:  Social History   Socioeconomic History   Marital status: Single    Spouse name: Not on file   Number of children: 2   Years of education: Not on file   Highest education level: 12th grade  Occupational History   Not on file  Tobacco Use   Smoking status: Some Days    Years: 15.00    Types: Cigarettes   Smokeless tobacco: Never   Tobacco comments:    The only time he smokes is when he has a beer 1 cigarette. 1 pack of cigarettes lasts ~2 weeks.  Vaping Use   Vaping Use: Never used  Substance and Sexual Activity   Alcohol use: Yes    Alcohol/week: 1.0 standard drink    Types: 1 Cans of beer per week    Comment: 1-2 beers a week   Drug use: Never   Sexual activity: Yes  Other Topics Concern   Not on file  Social History Narrative   Not on file   Social Determinants of Health   Financial Resource Strain: Not on file  Food Insecurity: No Food Insecurity   Worried About Running Out of Food in the Last Year: Never true   Ran Out of Food in the Last Year: Never true  Transportation Needs: No Transportation Needs   Lack of Transportation (Medical): No   Lack of Transportation (Non-Medical): No  Physical Activity: Not on file  Stress: Not on file  Social Connections: Not on file  Intimate Partner Violence: Not on file   Social History   Tobacco Use  Smoking Status Some Days   Years: 15.00   Types: Cigarettes  Smokeless Tobacco Never  Tobacco Comments   The only time he smokes is when he has a beer 1 cigarette. 1 pack of cigarettes lasts ~2 weeks.   Social History   Substance and Sexual Activity  Alcohol Use Yes   Alcohol/week: 1.0 standard drink   Types: 1 Cans of beer per week   Comment: 1-2 beers a week    Family History:  Family History  Problem Relation Age  of Onset   Hypertension Mother    Constipation Father        chronic   Alzheimer's disease Father    Deep vein thrombosis Sister     Past medical history, surgical history, medications, allergies, family history and social history reviewed with patient today and changes made to appropriate areas of the chart.   Review of Systems  HENT:         Denies vision changes.  Eyes:  Negative for blurred vision and double vision.  Respiratory:  Negative for shortness of breath.   Cardiovascular:  Negative for chest pain, palpitations and leg swelling.  Neurological:  Negative for dizziness, tingling and headaches.  Endo/Heme/Allergies:  Negative for polydipsia.       Denies Polyuria  All other ROS negative except what is listed above and in the HPI.      Objective:    BP 115/73    Pulse 80    Temp 98.6 F (37 C) (Oral)    Ht 5' 7.8" (1.722 m)    Wt 274 lb 3.2 oz (124.4 kg)    SpO2 96%    BMI 41.94 kg/m   Wt Readings from Last 3 Encounters:  10/23/21 274 lb 3.2 oz (124.4 kg)  08/07/21 275 lb (124.7 kg)  06/08/21 282 lb 3.2 oz (128 kg)    Physical Exam Vitals and nursing note reviewed.  Constitutional:      General: He is not in acute distress.    Appearance: Normal appearance. He is obese. He is not ill-appearing, toxic-appearing or diaphoretic.  HENT:     Head: Normocephalic.     Right Ear: Tympanic membrane, ear canal and external ear normal.     Left Ear: Tympanic membrane, ear canal and external ear normal.     Nose: Nose normal. No congestion or rhinorrhea.     Mouth/Throat:     Mouth: Mucous membranes are moist.  Eyes:     General:        Right eye: No discharge.        Left eye: No discharge.     Extraocular Movements: Extraocular movements intact.     Conjunctiva/sclera: Conjunctivae normal.     Pupils: Pupils are equal, round, and reactive to light.  Cardiovascular:     Rate and Rhythm: Normal rate and regular rhythm.     Heart sounds: No murmur heard. Pulmonary:      Effort: Pulmonary effort is normal. No respiratory distress.     Breath sounds: Normal breath sounds. No wheezing, rhonchi or rales.  Abdominal:     General: Abdomen is flat. Bowel sounds are normal. There is no distension.     Palpations: Abdomen is soft.     Tenderness: There is no abdominal tenderness. There is no guarding.  Musculoskeletal:     Cervical back: Normal range of motion and neck supple.  Skin:    General: Skin is warm and dry.     Capillary Refill: Capillary refill takes less than 2 seconds.  Neurological:     General: No focal deficit present.     Mental Status: He is alert and oriented to person, place, and time.     Cranial Nerves: No cranial nerve deficit.     Motor: No weakness.     Deep Tendon Reflexes: Reflexes normal.  Psychiatric:        Mood and Affect: Mood normal.        Behavior: Behavior normal.        Thought Content: Thought content normal.        Judgment: Judgment normal.    Results for orders placed or performed in visit on 08/07/21  Comp Met (CMET)  Result Value Ref Range   Glucose 106 (H) 70 - 99 mg/dL   BUN 18 6 - 24 mg/dL   Creatinine, Ser 1.67 (H) 0.76 - 1.27 mg/dL   eGFR 48 (L) >59 mL/min/1.73   BUN/Creatinine Ratio 11 9 - 20   Sodium 139 134 - 144 mmol/L  Potassium 4.2 3.5 - 5.2 mmol/L   Chloride 102 96 - 106 mmol/L   CO2 21 20 - 29 mmol/L   Calcium 10.1 8.7 - 10.2 mg/dL   Total Protein 7.7 6.0 - 8.5 g/dL   Albumin 4.7 3.8 - 4.9 g/dL   Globulin, Total 3.0 1.5 - 4.5 g/dL   Albumin/Globulin Ratio 1.6 1.2 - 2.2   Bilirubin Total 0.4 0.0 - 1.2 mg/dL   Alkaline Phosphatase 104 44 - 121 IU/L   AST 16 0 - 40 IU/L   ALT 16 0 - 44 IU/L  HgB A1c  Result Value Ref Range   Hgb A1c MFr Bld 5.7 (H) 4.8 - 5.6 %   Est. average glucose Bld gHb Est-mCnc 117 mg/dL  Lipid Profile  Result Value Ref Range   Cholesterol, Total 186 100 - 199 mg/dL   Triglycerides 175 (H) 0 - 149 mg/dL   HDL 42 >39 mg/dL   VLDL Cholesterol Cal 31 5 - 40  mg/dL   LDL Chol Calc (NIH) 113 (H) 0 - 99 mg/dL   Chol/HDL Ratio 4.4 0.0 - 5.0 ratio  Vitamin D (25 hydroxy)  Result Value Ref Range   Vit D, 25-Hydroxy 24.6 (L) 30.0 - 100.0 ng/mL      Assessment & Plan:   Problem List Items Addressed This Visit       Cardiovascular and Mediastinum   Essential hypertension    Chronic.  Controlled.  Continue with current medication regimen of Amlodipine 26m, HCTZ 267m and Losartan 10039maily.  Labs ordered today.  Return to clinic in 3 months for reevaluation.  Call sooner if concerns arise.          Endocrine   Diabetes mellitus without complication (HCC)    Chronic.  Controlled.  Continue with current medication regimen on Ozempic 0.43m54mily.  Labs ordered today.  Return to clinic in 3 months for reevaluation.  Call sooner if concerns arise.        Relevant Orders   HgB A1c     Genitourinary   CKD (chronic kidney disease) stage 3, GFR 30-59 ml/min (HCC)    Chronic.  Controlled.  Continue with current medication regimen.  Labs ordered today.  Return to clinic in 6 months for reevaluation.  Call sooner if concerns arise.          Other   Vitamin D deficiency    Labs ordered today. Will make recommendations based on lab results.       Relevant Orders   Vitamin D (25 hydroxy)   Other Visit Diagnoses     Annual physical exam    -  Primary   Health maintenance reviewed during visit. Flu and pneumonia given during visit today. Labs ordered. Up to date on Colon Cancer screen.   Relevant Orders   TSH   PSA   Lipid panel   CBC with Differential/Platelet   Comprehensive metabolic panel   Urinalysis, Routine w reflex microscopic   Vitamin D (25 hydroxy)   HgB A1c   Need for influenza vaccination       Relevant Orders   Flu Vaccine QUAD 72mo+34moFluarix, Fluzone & Alfiuria Quad PF) (Completed)   Encounter for hepatitis C screening test for low risk patient       Relevant Orders   Hepatitis C antibody   Screening for HIV (human  immunodeficiency virus)       Relevant Orders   HIV Antibody (routine testing w rflx)  Discussed aspirin prophylaxis for myocardial infarction prevention and decision was it was not indicated  LABORATORY TESTING:  Health maintenance labs ordered today as discussed above.   The natural history of prostate cancer and ongoing controversy regarding screening and potential treatment outcomes of prostate cancer has been discussed with the patient. The meaning of a false positive PSA and a false negative PSA has been discussed. He indicates understanding of the limitations of this screening test and wishes to proceed with screening PSA testing.   IMMUNIZATIONS:   - Tdap: Tetanus vaccination status reviewed: last tetanus booster within 10 years. - Influenza: Administered today - Pneumovax: Administered today - Prevnar: Not applicable - COVID: Refused - HPV: Not applicable - Shingrix vaccine:  Not up to date  SCREENING: - Colonoscopy: Up to date  Discussed with patient purpose of the colonoscopy is to detect colon cancer at curable precancerous or early stages   - AAA Screening: Not applicable  -Hearing Test: Not applicable  -Spirometry: Not applicable   PATIENT COUNSELING:    Sexuality: Discussed sexually transmitted diseases, partner selection, use of condoms, avoidance of unintended pregnancy  and contraceptive alternatives.   Advised to avoid cigarette smoking.  I discussed with the patient that most people either abstain from alcohol or drink within safe limits (<=14/week and <=4 drinks/occasion for males, <=7/weeks and <= 3 drinks/occasion for females) and that the risk for alcohol disorders and other health effects rises proportionally with the number of drinks per week and how often a drinker exceeds daily limits.  Discussed cessation/primary prevention of drug use and availability of treatment for abuse.   Diet: Encouraged to adjust caloric intake to maintain  or  achieve ideal body weight, to reduce intake of dietary saturated fat and total fat, to limit sodium intake by avoiding high sodium foods and not adding table salt, and to maintain adequate dietary potassium and calcium preferably from fresh fruits, vegetables, and low-fat dairy products.    stressed the importance of regular exercise  Injury prevention: Discussed safety belts, safety helmets, smoke detector, smoking near bedding or upholstery.   Dental health: Discussed importance of regular tooth brushing, flossing, and dental visits.   Follow up plan: NEXT PREVENTATIVE PHYSICAL DUE IN 1 YEAR. Return in about 3 months (around 01/21/2022) for HTN, HLD, DM2 FU.

## 2021-10-23 NOTE — Assessment & Plan Note (Signed)
Chronic.  Controlled.  Continue with current medication regimen on Ozempic 0.25mg  daily.  Labs ordered today.  Return to clinic in 3 months for reevaluation.  Call sooner if concerns arise.

## 2021-10-23 NOTE — Assessment & Plan Note (Signed)
Chronic.  Controlled.  Continue with current medication regimen.  Labs ordered today.  Return to clinic in 6 months for reevaluation.  Call sooner if concerns arise.  ? ?

## 2021-10-23 NOTE — Assessment & Plan Note (Signed)
Labs ordered today.  Will make recommendations based on lab results. ?

## 2021-10-24 LAB — LIPID PANEL
Chol/HDL Ratio: 3.5 ratio (ref 0.0–5.0)
Cholesterol, Total: 152 mg/dL (ref 100–199)
HDL: 43 mg/dL (ref >39)
LDL Chol Calc (NIH): 83 mg/dL (ref 0–99)
Triglycerides: 146 mg/dL (ref 0–149)
VLDL Cholesterol Cal: 26 mg/dL (ref 5–40)

## 2021-10-24 LAB — CBC WITH DIFFERENTIAL/PLATELET
Basophils Absolute: 0 10*3/uL (ref 0.0–0.2)
Basos: 0 %
EOS (ABSOLUTE): 0.1 10*3/uL (ref 0.0–0.4)
Eos: 2 %
Hematocrit: 44.9 % (ref 37.5–51.0)
Hemoglobin: 14.8 g/dL (ref 13.0–17.7)
Immature Grans (Abs): 0 10*3/uL (ref 0.0–0.1)
Immature Granulocytes: 0 %
Lymphocytes Absolute: 1.8 10*3/uL (ref 0.7–3.1)
Lymphs: 23 %
MCH: 27.1 pg (ref 26.6–33.0)
MCHC: 33 g/dL (ref 31.5–35.7)
MCV: 82 fL (ref 79–97)
Monocytes Absolute: 0.5 10*3/uL (ref 0.1–0.9)
Monocytes: 7 %
Neutrophils Absolute: 5.3 10*3/uL (ref 1.4–7.0)
Neutrophils: 68 %
Platelets: 291 10*3/uL (ref 150–450)
RBC: 5.47 x10E6/uL (ref 4.14–5.80)
RDW: 15.5 % — ABNORMAL HIGH (ref 11.6–15.4)
WBC: 7.8 10*3/uL (ref 3.4–10.8)

## 2021-10-24 LAB — COMPREHENSIVE METABOLIC PANEL
ALT: 10 IU/L (ref 0–44)
AST: 13 IU/L (ref 0–40)
Albumin/Globulin Ratio: 1.6 (ref 1.2–2.2)
Albumin: 4.5 g/dL (ref 3.8–4.9)
Alkaline Phosphatase: 89 IU/L (ref 44–121)
BUN/Creatinine Ratio: 10 (ref 9–20)
BUN: 19 mg/dL (ref 6–24)
Bilirubin Total: 0.3 mg/dL (ref 0.0–1.2)
CO2: 22 mmol/L (ref 20–29)
Calcium: 9.4 mg/dL (ref 8.7–10.2)
Chloride: 105 mmol/L (ref 96–106)
Creatinine, Ser: 1.81 mg/dL — ABNORMAL HIGH (ref 0.76–1.27)
Globulin, Total: 2.8 g/dL (ref 1.5–4.5)
Glucose: 115 mg/dL — ABNORMAL HIGH (ref 70–99)
Potassium: 3.7 mmol/L (ref 3.5–5.2)
Sodium: 141 mmol/L (ref 134–144)
Total Protein: 7.3 g/dL (ref 6.0–8.5)
eGFR: 43 mL/min/{1.73_m2} — ABNORMAL LOW (ref >59)

## 2021-10-24 LAB — HEMOGLOBIN A1C
Est. average glucose Bld gHb Est-mCnc: 123 mg/dL
Hgb A1c MFr Bld: 5.9 % — ABNORMAL HIGH (ref 4.8–5.6)

## 2021-10-24 LAB — TSH: TSH: 1.34 u[IU]/mL (ref 0.450–4.500)

## 2021-10-24 LAB — VITAMIN D 25 HYDROXY (VIT D DEFICIENCY, FRACTURES): Vit D, 25-Hydroxy: 31.9 ng/mL (ref 30.0–100.0)

## 2021-10-24 LAB — PSA: Prostate Specific Ag, Serum: 0.7 ng/mL (ref 0.0–4.0)

## 2021-10-24 LAB — HEPATITIS C ANTIBODY: Hep C Virus Ab: <0.1 s/co ratio (ref 0.0–0.9)

## 2021-10-24 LAB — HIV ANTIBODY (ROUTINE TESTING W REFLEX): HIV Screen 4th Generation wRfx: NONREACTIVE

## 2021-10-24 NOTE — Progress Notes (Signed)
Please let patient know that overall his lab work looks good.  His cholesterol has improved from prior. Hi kidney function was slightly worse. I recommend continuing to follow up with the kidney specialist. His vitamin D is within normal range.  Follow up as discussed.

## 2021-10-25 ENCOUNTER — Telehealth: Payer: Self-pay | Admitting: Nurse Practitioner

## 2021-10-25 NOTE — Telephone Encounter (Signed)
Form signed.

## 2021-10-25 NOTE — Telephone Encounter (Signed)
Placed fax in provider's folder.  Copied from [place] [phone]. Topic: General - Other >> Oct 25, 2021  1:20 PM Fields, [employer] R wrote: Reason for CRM: emma from [facility] checking on status of cpap machine form that she sent on 12/20 requesting for it to be sent back today >> Oct 25, 2021  1:37 PM Street, [place] L wrote: Spoke with Terrence Dupont and let her know that I will be looking for the new fax today and I will get it to [name]

## 2021-10-25 NOTE — Telephone Encounter (Signed)
Form faxed

## 2021-11-03 ENCOUNTER — Telehealth: Payer: Self-pay | Admitting: Nurse Practitioner

## 2021-11-03 NOTE — Telephone Encounter (Signed)
Called and reviewed lab results with patient based on result note from [name] in the patient's chart.

## 2021-11-03 NOTE — Telephone Encounter (Signed)
Copied from [place] [phone]. Topic: General - Other >> Nov 03, 2021  1:00 PM Pawlus, Brayton Layman A wrote: Reason for CRM: Pt called in regarding his latest lab results, pt had some follow up questions, please advise. >> Nov 03, 2021  1:13 PM Street, [place] L wrote: Pt was wondering where his A1c is at and what he needs to do to keep it down and control it.

## 2021-11-08 ENCOUNTER — Ambulatory Visit: Payer: Self-pay

## 2021-11-08 NOTE — Telephone Encounter (Signed)
Pt called and has questions about his  losartan (COZAAR) 100 MG tablet  amLODipine (NORVASC) 10 MG tablet   Pt. Calling to verify that pharmacy will contact the office for further refills. Instructed they will.  Answer Assessment - Initial Assessment Questions 1. NAME of MEDICATION: "What medicine are you calling about?"     Losartan, amlodipine 2. QUESTION: "What is your question?" (e.g., double dose of medicine, side effect)     Will pharmacy contact the office for future refills? 3. PRESCRIBING HCP: "Who prescribed it?" Reason: if prescribed by specialist, call should be referred to that group.     [place] 4. SYMPTOMS: "Do you have any symptoms?"     N/a 5. SEVERITY: If symptoms are present, ask "Are they mild, moderate or severe?"     N/a 6. PREGNANCY:  "Is there any chance that you are pregnant?" "When was your last menstrual period?"     N/a  Protocols used: Medication Question Call-A-AH

## 2021-11-15 DIAGNOSIS — Z419 Encounter for procedure for purposes other than remedying health state, unspecified: Secondary | ICD-10-CM | POA: Diagnosis not present

## 2021-12-13 DIAGNOSIS — Z419 Encounter for procedure for purposes other than remedying health state, unspecified: Secondary | ICD-10-CM | POA: Diagnosis not present

## 2021-12-18 ENCOUNTER — Telehealth: Payer: Self-pay | Admitting: Nurse Practitioner

## 2021-12-18 NOTE — Telephone Encounter (Signed)
Anthony Lam dropped off a [name] summons form and states that due to his condition he is unable to serve jury duty and would like provider to complete form and call when he can pick it up. ?

## 2021-12-18 NOTE — Telephone Encounter (Signed)
Please find out why patient is not able to perform jury duty.  He does not have a medical reason that he can't do jury duty.  ?

## 2021-12-19 NOTE — Telephone Encounter (Signed)
Called and spoke to patient. He states that he gets very anxious and nervous when around crowds of people. He also states that with his back pain, he cannot sit for a long period of time.  ?

## 2021-12-21 NOTE — Telephone Encounter (Signed)
Spoke with patient and notified him that his letter was written and placed up front with front desk staff for pick. Patient states he will try and get back her today for pick up, but if not today, he would definitely be here tomorrow.  ?

## 2022-01-01 ENCOUNTER — Other Ambulatory Visit: Payer: Self-pay | Admitting: Nurse Practitioner

## 2022-01-01 DIAGNOSIS — R7303 Prediabetes: Secondary | ICD-10-CM

## 2022-01-01 NOTE — Telephone Encounter (Signed)
Copied from [place] [phone]. Topic: Quick Communication - Rx Refill/Question ?>> Jan 01, 2022  2:30 PM Leward Quan A wrote: ?Medication: Semaglutide,0.25 or 0.'5MG'$ /DOS, (OZEMPIC, 0.25 OR 0.5 MG/DOSE,) 2 MG/1.5ML SOPN ? ?Has the patient contacted their pharmacy? No. Not out but has 3 pens left  ?(Agent: If no, request that the patient contact the pharmacy for the refill. If patient does not wish to contact the pharmacy document the reason why and proceed with request.) ?(Agent: If yes, when and what did the pharmacy advise?) ? ?Preferred Pharmacy (with phone number or street name): [facility] #[id number] - [place], [place]  ?Phone:  [phone] ?Fax:  [phone] ? ? ? ?Has the patient been seen for an appointment in the last year OR does the patient have an upcoming appointment? Yes.   ? ?Agent: Please be advised that RX refills may take up to 3 business days. We ask that you follow-up with your pharmacy. ?

## 2022-01-03 MED ORDER — OZEMPIC (0.25 OR 0.5 MG/DOSE) 2 MG/1.5ML ~~LOC~~ SOPN: 0.2500 mg | PEN_INJECTOR | SUBCUTANEOUS | 0 refills | Status: DC

## 2022-01-03 NOTE — Telephone Encounter (Signed)
Requested Prescriptions  ?Pending Prescriptions Disp Refills  ?? Semaglutide,0.25 or 0.'5MG'$ /DOS, (OZEMPIC, 0.25 OR 0.5 MG/DOSE,) 2 MG/1.5ML SOPN 6 mL 0  ?  Sig: Inject 0.25 mg into the skin once a week.  ?  ? Endocrinology:  Diabetes - GLP-1 Receptor Agonists - semaglutide Failed - 01/01/2022  2:46 PM  ?  ?  Failed - HBA1C in normal range and within 180 days  ?  Hemoglobin A1C  ?Date Value Ref Range Status  ?05/11/2015 6.1  Final  ? ?Hgb A1c MFr Bld  ?Date Value Ref Range Status  ?10/23/2021 5.9 (H) 4.8 - 5.6 % Final  ?  Comment:  ?           Prediabetes: 5.7 - 6.4 ?         Diabetes: >6.4 ?         Glycemic control for adults with diabetes: <7.0 ?  ?   ?  ?  Failed - Cr in normal range and within 360 days  ?  Creatinine  ?Date Value Ref Range Status  ?08/04/2013 1.68 (H) 0.60 - 1.30 mg/dL Final  ? ?Creatinine, Ser  ?Date Value Ref Range Status  ?10/23/2021 1.81 (H) 0.76 - 1.27 mg/dL Final  ?   ?  ?  Passed - Valid encounter within last 6 months  ?  Recent Outpatient Visits   ?      ? 2 months ago Annual physical exam  ? [place], NP  ? 4 months ago Stage 3a chronic kidney disease ([place])  ? [facility] Jon Billings, NP  ?  ?  ?Future Appointments   ?        ? In 3 weeks Jon Billings, NP [facility], [facility]  ?  ? ?  ?  ?  ? ?

## 2022-01-13 DIAGNOSIS — Z419 Encounter for procedure for purposes other than remedying health state, unspecified: Secondary | ICD-10-CM | POA: Diagnosis not present

## 2022-01-29 ENCOUNTER — Ambulatory Visit: Payer: Medicaid Other | Admitting: Nurse Practitioner

## 2022-02-01 ENCOUNTER — Telehealth: Payer: Self-pay | Admitting: Nurse Practitioner

## 2022-02-01 ENCOUNTER — Other Ambulatory Visit: Payer: Self-pay | Admitting: Nurse Practitioner

## 2022-02-01 DIAGNOSIS — I10 Essential (primary) hypertension: Secondary | ICD-10-CM

## 2022-02-01 NOTE — Telephone Encounter (Signed)
Copied from [place] [phone]. Topic: General - Other ?>> Feb 01, 2022  3:57 PM Erick Blinks wrote: ?Reason for CRM:  ? ?Provider needs to reach out to [facility] on this line [phone]  ? ?[id number] is the case ID with Medicaid.  ? ?Pt has had his teeth extracted two months ago, he had a dental appointment today that was cancelled because they are waiting for approval from PCP.  ? ?Pt has another appt next Thursday, he wants this completed prior to his appt. ? ?Please contact pt once this is completed ? ?Best contact: [phone] ?

## 2022-02-01 NOTE — Telephone Encounter (Signed)
Requested Prescriptions  ?Pending Prescriptions Disp Refills  ?? amLODipine (NORVASC) 10 MG tablet [Pharmacy Med Name: AMLODIPINE BESYLATE '10MG'$  TABLETS] 90 tablet 0  ?  Sig: TAKE 1 TABLET BY MOUTH EVERY DAY  ?  ? Cardiovascular: Calcium Channel Blockers 2 Passed - 02/01/2022  6:24 AM  ?  ?  Passed - Last BP in normal range  ?  BP Readings from Last 1 Encounters:  ?10/23/21 115/73  ?   ?  ?  Passed - Last Heart Rate in normal range  ?  Pulse Readings from Last 1 Encounters:  ?10/23/21 80  ?   ?  ?  Passed - Valid encounter within last 6 months  ?  Recent Outpatient Visits   ?      ? 3 months ago Annual physical exam  ? [place], NP  ? 5 months ago Stage 3a chronic kidney disease ([place])  ? [facility] Jon Billings, NP  ?  ?  ?Future Appointments   ?        ? In 3 weeks Jon Billings, NP [facility], [facility]  ?  ? ?  ?  ?  ? ?

## 2022-02-02 NOTE — Telephone Encounter (Signed)
Spoke to [facility], a prior authorization needs to be started. Was transferred 3 different times and was not able to get the right department. Will try again later.  ?

## 2022-02-02 NOTE — Telephone Encounter (Signed)
Please call the number and find out what patient needs.  ?

## 2022-02-05 NOTE — Telephone Encounter (Signed)
Left a message for [facility] to give our office a call back in regards to previous message with patient and to gather information in regards as to why the patient's appointment with their facility was cancelled.  ? ?OK for PEC/Nurse triage to gather information if facility calls back.  ?

## 2022-02-06 ENCOUNTER — Telehealth: Payer: Self-pay | Admitting: Nurse Practitioner

## 2022-02-06 NOTE — Telephone Encounter (Signed)
Attempted to contact the [facility] staff will be reaching back out to our office as they are short staff to collect more information regarding patient's previous call.  ?

## 2022-02-06 NOTE — Telephone Encounter (Unsigned)
Medication Refill - Medication:  ? ?fluticasone (FLONASE) 50 MCG/ACT nasal spray.   ? ?losartan (COZAAR) 100 MG tablet (patient has a 30 day supply) but states pharmacy keeps requesting him to reach out to PCP ? ?Has the patient contacted their pharmacy? Yes.   ? ?(Agent: If yes, when and what did the pharmacy advise?) ? ?Preferred Pharmacy (with phone number or street name):  ?[id number] [address], [place] AT [address] Phone:  [phone]  ?Fax:  [phone]  ?  ? ? ?Has the patient been seen for an appointment in the last year OR does the patient have an upcoming appointment? Yes.   ? ?Agent: Please be advised that RX refills may take up to 3 business days. We ask that you follow-up with your pharmacy. ?

## 2022-02-12 DIAGNOSIS — Z419 Encounter for procedure for purposes other than remedying health state, unspecified: Secondary | ICD-10-CM | POA: Diagnosis not present

## 2022-02-22 ENCOUNTER — Ambulatory Visit: Payer: Medicaid Other | Admitting: Nurse Practitioner

## 2022-03-05 ENCOUNTER — Other Ambulatory Visit: Payer: Self-pay | Admitting: Nurse Practitioner

## 2022-03-05 ENCOUNTER — Ambulatory Visit: Payer: Medicaid Other | Admitting: Nurse Practitioner

## 2022-03-05 DIAGNOSIS — R7303 Prediabetes: Secondary | ICD-10-CM

## 2022-03-05 NOTE — Telephone Encounter (Signed)
Medication Refill - Medication: Semaglutide,0.25 or 0.'5MG'$ /DOS, (OZEMPIC, 0.25 OR 0.5 MG/DOSE,) 2 MG/1.5ML SOPN  Has the patient contacted their pharmacy? No. (He forgot and took his last injection this morning  Preferred Pharmacy (with phone number or street name): [facility] #[id number] - [place], [place]  Has the patient been seen for an appointment in the last year OR does the patient have an upcoming appointment? Yes.    Agent: Please be advised that RX refills may take up to 3 business days. We ask that you follow-up with your pharmacy.

## 2022-03-06 NOTE — Telephone Encounter (Signed)
last RF 01/03/22 6 ml  Requested Prescriptions  Refused Prescriptions Disp Refills  . Semaglutide,0.25 or 0.'5MG'$ /DOS, (OZEMPIC, 0.25 OR 0.5 MG/DOSE,) 2 MG/1.5ML SOPN 6 mL 0    Sig: Inject 0.25 mg into the skin once a week.     Endocrinology:  Diabetes - GLP-1 Receptor Agonists - semaglutide Failed - 03/05/2022 10:41 AM      Failed - HBA1C in normal range and within 180 days    Hemoglobin A1C  Date Value Ref Range Status  05/11/2015 6.1  Final   Hgb A1c MFr Bld  Date Value Ref Range Status  10/23/2021 5.9 (H) 4.8 - 5.6 % Final    Comment:             Prediabetes: 5.7 - 6.4          Diabetes: >6.4          Glycemic control for adults with diabetes: <7.0          Failed - Cr in normal range and within 360 days    Creatinine  Date Value Ref Range Status  08/04/2013 1.68 (H) 0.60 - 1.30 mg/dL Final   Creatinine, Ser  Date Value Ref Range Status  10/23/2021 1.81 (H) 0.76 - 1.27 mg/dL Final         Passed - Valid encounter within last 6 months    Recent Outpatient Visits          4 months ago Annual physical exam   [facility] Jon Billings, NP   7 months ago Stage 3a chronic kidney disease ([place])   [place], Karen, NP      Future Appointments            In 3 days Jon Billings, NP [facility], [place]

## 2022-03-07 ENCOUNTER — Other Ambulatory Visit: Payer: Self-pay | Admitting: Nurse Practitioner

## 2022-03-07 DIAGNOSIS — R7303 Prediabetes: Secondary | ICD-10-CM

## 2022-03-07 NOTE — Telephone Encounter (Signed)
Medication Refill - Medication: Semaglutide,0.25 or 0.'5MG'$ /DOS, (OZEMPIC, 0.25 OR 0.5 MG/DOSE,) 2 MG/1.5ML SOPN  and  fluticasone (FLONASE) 50 MCG/ACT nasal spray   Has the patient contacted their pharmacy? Yes.     Preferred Pharmacy (with phone number or street name):  [facility] #[id number] [address], [place] Phone:  [phone]  Fax:  [phone]     Has the patient been seen for an appointment in the last year OR does the patient have an upcoming appointment? Yes.    Agent: Please be advised that RX refills may take up to 3 business days. We ask that you follow-up with your pharmacy.

## 2022-03-07 NOTE — Telephone Encounter (Signed)
Requested medications are due for refill today.  Unsure about Ozempic, Flonase yes  Requested medications are on the active medications list.  yes  Last refill. Ozempic refilled 01/03/2022 30m , Flonase 01/26/2021 16/3 refills  Future visit scheduled.   yes  Notes to clinic.  Flonase rx written to expire 01/26/2022 - rx is expired.  Ozempic was refused yesterday due to request for refill too soon. Please refill for refill.    Requested Prescriptions  Pending Prescriptions Disp Refills   Semaglutide,0.25 or 0.'5MG'$ /DOS, (OZEMPIC, 0.25 OR 0.5 MG/DOSE,) 2 MG/1.5ML SOPN 6 mL 0    Sig: Inject 0.25 mg into the skin once a week.     Endocrinology:  Diabetes - GLP-1 Receptor Agonists - semaglutide Failed - 03/07/2022 11:43 AM      Failed - HBA1C in normal range and within 180 days    Hemoglobin A1C  Date Value Ref Range Status  05/11/2015 6.1  Final   Hgb A1c MFr Bld  Date Value Ref Range Status  10/23/2021 5.9 (H) 4.8 - 5.6 % Final    Comment:             Prediabetes: 5.7 - 6.4          Diabetes: >6.4          Glycemic control for adults with diabetes: <7.0          Failed - Cr in normal range and within 360 days    Creatinine  Date Value Ref Range Status  08/04/2013 1.68 (H) 0.60 - 1.30 mg/dL Final   Creatinine, Ser  Date Value Ref Range Status  10/23/2021 1.81 (H) 0.76 - 1.27 mg/dL Final         Passed - Valid encounter within last 6 months    Recent Outpatient Visits           [facility]   7 months [place] 3a [place]   [place]       Future Appointments             In 2 days HJon Billings NP [facility]              fluticasone (FLONASE) 50 MCG/ACT nasal spray 16 g 3    Sig: SPRAY 1 SPRAY IN EACH NOSTRIL ONCE DAILY.     There is no refill protocol information for this order

## 2022-03-08 NOTE — Progress Notes (Deleted)
There were no vitals taken for this visit.   Subjective:    Patient ID: Anthony Lam, male    DOB: 1966-02-20, 56 y.o.   MRN: [id number]  HPI: Anthony Lam is a 56 y.o. male  No chief complaint on file.  HYPERTENSION Hypertension status: {Blank single:19197::"controlled","uncontrolled","better","worse","exacerbated","stable"}  Satisfied with current treatment? {Blank single:19197::"yes","no"} Duration of hypertension: {Blank single:19197::"chronic","months","years"} BP monitoring frequency:  {Blank single:19197::"not checking","rarely","daily","weekly","monthly","a few times a day","a few times a week","a few times a month"} BP range:  BP medication side effects:  {Blank single:19197::"yes","no"} Medication compliance: {Blank single:19197::"excellent compliance","good compliance","fair compliance","poor compliance"} Previous BP meds:{Blank BZJIRCVE:93810::"FBPZ","WCHENIDPOE","UMPNTIRWER/XVQMGQQPYP","PJKDTOIZ","TIWPYKDXIP","JASNKNLZJQ/BHAL","PFXTKWIOXB (bystolic)","carvedilol","chlorthalidone","clonidine","diltiazem","exforge HCT","HCTZ","irbesartan (avapro)","labetalol","lisinopril","lisinopril-HCTZ","losartan (cozaar)","methyldopa","nifedipine","olmesartan (benicar)","olmesartan-HCTZ","quinapril","ramipril","spironalactone","tekturna","valsartan","valsartan-HCTZ","verapamil"} Aspirin: {Blank single:19197::"yes","no"} Recurrent headaches: {Blank single:19197::"yes","no"} Visual changes: {Blank single:19197::"yes","no"} Palpitations: {Blank single:19197::"yes","no"} Dyspnea: {Blank single:19197::"yes","no"} Chest pain: {Blank single:19197::"yes","no"} Lower extremity edema: {Blank single:19197::"yes","no"} Dizzy/lightheaded: {Blank single:19197::"yes","no"}  DIABETES Hypoglycemic episodes:{Blank single:19197::"yes","no"} Polydipsia/polyuria: {Blank single:19197::"yes","no"} Visual disturbance: {Blank single:19197::"yes","no"} Chest pain: {Blank  single:19197::"yes","no"} Paresthesias: {Blank single:19197::"yes","no"} Glucose Monitoring: {Blank single:19197::"yes","no"}  Accucheck frequency: {Blank single:19197::"Not Checking","Daily","BID","TID"}  Fasting glucose:  Post prandial:  Evening:  Before meals: Taking Insulin?: {Blank single:19197::"yes","no"}  Long acting insulin:  Short acting insulin: Blood Pressure Monitoring: {Blank single:19197::"not checking","rarely","daily","weekly","monthly","a few times a day","a few times a week","a few times a month"} Retinal Examination: {Blank single:19197::"Up to Date","Not up to Date"} Foot Exam: {Blank single:19197::"Up to Date","Not up to Date"} Diabetic Education: {Blank single:19197::"Completed","Not Completed"} Pneumovax: {Blank single:19197::"Up to Date","Not up to Date","unknown"} Influenza: {Blank single:19197::"Up to Date","Not up to Date","unknown"} Aspirin: {Blank single:19197::"yes","no"}  CHRONIC KIDNEY DISEASE CKD status: {Blank single:19197::"controlled","uncontrolled","better","worse","exacerbated","stable"} Medications renally dose: {Blank single:19197::"yes","no"} Previous renal evaluation: {Blank single:19197::"yes","no"} Pneumovax:  {Blank single:19197::"Up to Date","Not up to Date","unknown"} Influenza Vaccine:  {Blank single:19197::"Up to Date","Not up to Date","unknown"}  Relevant past medical, surgical, family and social history reviewed and updated as indicated. Interim medical history since our last visit reviewed. Allergies and medications reviewed and updated.  Review of Systems  Per HPI unless specifically indicated above     Objective:    There were no vitals taken for this visit.  Wt Readings from Last 3 Encounters:  10/23/21 274 lb 3.2 oz (124.4 kg)  08/07/21 275 lb (124.7 kg)  06/08/21 282 lb 3.2 oz (128 kg)    Physical Exam  Results for orders placed or performed in visit on 10/23/21  Microscopic Examination   Urine  Result Value Ref  Range   WBC, UA None seen 0 - 5 /hpf   RBC None seen 0 - 2 /hpf   Epithelial Cells (non renal) None seen 0 - 10 /hpf   Casts Present (A) None seen /lpf   Cast Type Hyaline casts N/A   Crystals Present (A) N/A   Crystal Type Calcium Oxalate N/A   Mucus, UA Present (A) Not Estab.   Bacteria, UA Few (A) None seen/Few  TSH  Result Value Ref Range   TSH 1.340 0.450 - 4.500 uIU/mL  PSA  Result Value Ref Range   Prostate Specific Ag, Serum 0.7 0.0 - 4.0 ng/mL  Lipid panel  Result Value Ref Range   Cholesterol, Total 152 100 - 199 mg/dL   Triglycerides 146 0 - 149 mg/dL   HDL 43 >39 mg/dL   VLDL Cholesterol Cal 26 5 - 40 mg/dL   LDL Chol Calc (NIH) 83 0 - 99 mg/dL   Chol/HDL Ratio 3.5 0.0 - 5.0 ratio  CBC with Differential/Platelet  Result Value Ref Range   WBC 7.8 3.4 - 10.8 x10E3/uL   RBC 5.47 4.14 - 5.80 x10E6/uL   Hemoglobin  14.8 13.0 - 17.7 g/dL   Hematocrit 44.9 37.5 - 51.0 %   MCV 82 79 - 97 fL   MCH 27.1 26.6 - 33.0 pg   MCHC 33.0 31.5 - 35.7 g/dL   RDW 15.5 (H) 11.6 - 15.4 %   Platelets 291 150 - 450 x10E3/uL   Neutrophils 68 Not Estab. %   Lymphs 23 Not Estab. %   Monocytes 7 Not Estab. %   Eos 2 Not Estab. %   Basos 0 Not Estab. %   Neutrophils Absolute 5.3 1.4 - 7.0 x10E3/uL   Lymphocytes Absolute 1.8 0.7 - 3.1 x10E3/uL   Monocytes Absolute 0.5 0.1 - 0.9 x10E3/uL   EOS (ABSOLUTE) 0.1 0.0 - 0.4 x10E3/uL   Basophils Absolute 0.0 0.0 - 0.2 x10E3/uL   Immature Granulocytes 0 Not Estab. %   Immature Grans (Abs) 0.0 0.0 - 0.1 x10E3/uL  Comprehensive metabolic panel  Result Value Ref Range   Glucose 115 (H) 70 - 99 mg/dL   BUN 19 6 - 24 mg/dL   Creatinine, Ser 1.81 (H) 0.76 - 1.27 mg/dL   eGFR 43 (L) >59 mL/min/1.73   BUN/Creatinine Ratio 10 9 - 20   Sodium 141 134 - 144 mmol/L   Potassium 3.7 3.5 - 5.2 mmol/L   Chloride 105 96 - 106 mmol/L   CO2 22 20 - 29 mmol/L   Calcium 9.4 8.7 - 10.2 mg/dL   Total Protein 7.3 6.0 - 8.5 g/dL   Albumin 4.5 3.8 - 4.9 g/dL    Globulin, Total 2.8 1.5 - 4.5 g/dL   Albumin/Globulin Ratio 1.6 1.2 - 2.2   Bilirubin Total 0.3 0.0 - 1.2 mg/dL   Alkaline Phosphatase 89 44 - 121 IU/L   AST 13 0 - 40 IU/L   ALT 10 0 - 44 IU/L  Urinalysis, Routine w reflex microscopic  Result Value Ref Range   Specific Gravity, UA >1.030 (H) 1.005 - 1.030   pH, UA 5.5 5.0 - 7.5   Color, UA Yellow Yellow   Appearance Ur Clear Clear   Leukocytes,UA Negative Negative   Protein,UA 1+ (A) Negative/Trace   Glucose, UA Negative Negative   Ketones, UA Trace (A) Negative   RBC, UA Negative Negative   Bilirubin, UA Negative Negative   Urobilinogen, Ur 0.2 0.2 - 1.0 mg/dL   Nitrite, UA Negative Negative   Microscopic Examination See below:   Vitamin D (25 hydroxy)  Result Value Ref Range   Vit D, 25-Hydroxy 31.9 30.0 - 100.0 ng/mL  HgB A1c  Result Value Ref Range   Hgb A1c MFr Bld 5.9 (H) 4.8 - 5.6 %   Est. average glucose Bld gHb Est-mCnc 123 mg/dL  Hepatitis C antibody  Result Value Ref Range   Hep C Virus Ab <0.1 0.0 - 0.9 s/co ratio  HIV Antibody (routine testing w rflx)  Result Value Ref Range   HIV Screen 4th Generation wRfx Non Reactive Non Reactive      Assessment & Plan:   Problem List Items Addressed This Visit       Cardiovascular and Mediastinum   Essential hypertension - Primary     Endocrine   Diabetes mellitus without complication (HCC)     Genitourinary   CKD (chronic kidney disease) stage 3, GFR 30-59 ml/min (HCC)     Other   Vitamin D deficiency     Follow up plan: No follow-ups on file.

## 2022-03-09 ENCOUNTER — Ambulatory Visit: Payer: Self-pay | Admitting: *Deleted

## 2022-03-09 ENCOUNTER — Ambulatory Visit: Payer: Medicaid Other | Admitting: Nurse Practitioner

## 2022-03-09 DIAGNOSIS — E559 Vitamin D deficiency, unspecified: Secondary | ICD-10-CM

## 2022-03-09 DIAGNOSIS — I10 Essential (primary) hypertension: Secondary | ICD-10-CM

## 2022-03-09 DIAGNOSIS — E119 Type 2 diabetes mellitus without complications: Secondary | ICD-10-CM

## 2022-03-09 DIAGNOSIS — N1831 Chronic kidney disease, stage 3a: Secondary | ICD-10-CM

## 2022-03-09 MED ORDER — OZEMPIC (0.25 OR 0.5 MG/DOSE) 2 MG/1.5ML ~~LOC~~ SOPN: 0.2500 mg | PEN_INJECTOR | SUBCUTANEOUS | 0 refills | Status: DC

## 2022-03-09 NOTE — Telephone Encounter (Signed)
Pt called pharmacy and states they dont have any refills on file. Requested Prescriptions  Pending Prescriptions Disp Refills  . Semaglutide,0.25 or 0.'5MG'$ /DOS, (OZEMPIC, 0.25 OR 0.5 MG/DOSE,) 2 MG/1.5ML SOPN 6 mL 0    Sig: Inject 0.25 mg into the skin once a week.     Endocrinology:  Diabetes - GLP-1 Receptor Agonists - semaglutide Failed - 03/09/2022 11:11 AM      Failed - HBA1C in normal range and within 180 days    Hemoglobin A1C  Date Value Ref Range Status  05/11/2015 6.1  Final   Hgb A1c MFr Bld  Date Value Ref Range Status  10/23/2021 5.9 (H) 4.8 - 5.6 % Final    Comment:             Prediabetes: 5.7 - 6.4          Diabetes: >6.4          Glycemic control for adults with diabetes: <7.0          Failed - Cr in normal range and within 360 days    Creatinine  Date Value Ref Range Status  08/04/2013 1.68 (H) 0.60 - 1.30 mg/dL Final   Creatinine, Ser  Date Value Ref Range Status  10/23/2021 1.81 (H) 0.76 - 1.27 mg/dL Final         Passed - Valid encounter within last 6 months    Recent Outpatient Visits          4 months ago Annual physical exam   [facility] Jon Billings, NP   7 months ago Stage 3a chronic kidney disease ([place])   [place], Karen, NP      Future Appointments            In 1 week Jon Billings, NP [facility]           Refused Prescriptions Disp Refills  . Semaglutide,0.25 or 0.'5MG'$ /DOS, (OZEMPIC, 0.25 OR 0.5 MG/DOSE,) 2 MG/1.5ML SOPN 6 mL 0    Sig: Inject 0.25 mg into the skin once a week.     Endocrinology:  Diabetes - GLP-1 Receptor Agonists - semaglutide Failed - 03/09/2022 11:11 AM      Failed - HBA1C in normal range and within 180 days    Hemoglobin A1C  Date Value Ref Range Status  05/11/2015 6.1  Final   Hgb A1c MFr Bld  Date Value Ref Range Status  10/23/2021 5.9 (H) 4.8 - 5.6 % Final    Comment:             Prediabetes: 5.7 - 6.4          Diabetes: >6.4           Glycemic control for adults with diabetes: <7.0          Failed - Cr in normal range and within 360 days    Creatinine  Date Value Ref Range Status  08/04/2013 1.68 (H) 0.60 - 1.30 mg/dL Final   Creatinine, Ser  Date Value Ref Range Status  10/23/2021 1.81 (H) 0.76 - 1.27 mg/dL Final         Passed - Valid encounter within last 6 months    Recent Outpatient Visits          4 months ago Annual physical exam   [place], NP   7 months ago Stage 3a chronic kidney disease ([place])   [facility] Jon Billings, NP  Future Appointments            In 1 week Jon Billings, NP [facility], [place]           . fluticasone (FLONASE) 50 MCG/ACT nasal spray 16 g 3    Sig: SPRAY 1 SPRAY IN EACH NOSTRIL ONCE DAILY.     Ear, Nose, and Throat: Nasal Preparations - Corticosteroids Passed - 03/09/2022 11:11 AM      Passed - Valid encounter within last 12 months    Recent Outpatient Visits          4 months ago Annual physical exam   [facility] Jon Billings, NP   7 months ago Stage 3a chronic kidney disease [facility])   [place] Jon Billings, NP      Future Appointments            In 1 week Jon Billings, NP [facility], [place]

## 2022-03-09 NOTE — Telephone Encounter (Signed)
Called and let patient know that his prescription was sent in for him.

## 2022-03-09 NOTE — Telephone Encounter (Signed)
Pt called, explained to pt that he should have refills remaining at [facility] and to give them a call to see if they have any left on file and if not to call back and let us know so we can refill for him. Pt verbalized understanding and states he will call them now.

## 2022-03-09 NOTE — Addendum Note (Signed)
Addended by: Erie Noe on: 03/09/2022 11:11 AM   Modules accepted: Orders

## 2022-03-09 NOTE — Telephone Encounter (Signed)
Reason for Disposition  [1] Caller has URGENT medicine question about med that PCP or specialist prescribed AND [2] triager unable to answer question    Pt returned a call to let practice know the pharmacy had his insulin.  This information sent to [facility].  Answer Assessment - Initial Assessment Questions 1. NAME of MEDICATION: "What medicine are you calling about?"     Ozempic 2. QUESTION: "What is your question?" (e.g., double dose of medicine, side effect)     To call [facility] and see if the insulin was ready.    They do have the insulin. 3. PRESCRIBING HCP: "Who prescribed it?" Reason: if prescribed by specialist, call should be referred to that group.        Jon Billings, NP 4. SYMPTOMS: "Do you have any symptoms?"     *No Answer* 5. SEVERITY: If symptoms are present, ask "Are they mild, moderate or severe?"     *No Answer* 6. PREGNANCY:  "Is there any chance that you are pregnant?" "When was your last menstrual period?"     *No Answer*  Protocols used: Medication Question Call-A-AH

## 2022-03-09 NOTE — Telephone Encounter (Signed)
Pt took his last dose of Ozempic this Monday and is out / Pt called to get update on refill approval / Please advise

## 2022-03-14 ENCOUNTER — Telehealth: Payer: Self-pay | Admitting: Nurse Practitioner

## 2022-03-14 NOTE — Telephone Encounter (Signed)
Pt stated Ozempic comes 6 in a box per dose and was only given 1 box.  Pt stated was told he was supposed to get a quantity of 54 or 56.  Pt is unsure if what he received was correct. Pt is concerned and needs clarification.   Pt confirmed he is only using it once a week, every Monday.  Pt is requesting a call back.

## 2022-03-15 DIAGNOSIS — Z419 Encounter for procedure for purposes other than remedying health state, unspecified: Secondary | ICD-10-CM | POA: Diagnosis not present

## 2022-03-22 ENCOUNTER — Ambulatory Visit: Payer: Medicaid Other | Admitting: Nurse Practitioner

## 2022-04-04 ENCOUNTER — Ambulatory Visit: Payer: Medicaid Other | Admitting: Nurse Practitioner

## 2022-04-14 DIAGNOSIS — Z419 Encounter for procedure for purposes other than remedying health state, unspecified: Secondary | ICD-10-CM | POA: Diagnosis not present

## 2022-04-18 ENCOUNTER — Other Ambulatory Visit: Payer: Self-pay | Admitting: Nurse Practitioner

## 2022-04-18 DIAGNOSIS — R7303 Prediabetes: Secondary | ICD-10-CM

## 2022-04-18 NOTE — Telephone Encounter (Signed)
Pt called the pharmacy to refill his RX for Semaglutide,0.25 or 0.'5MG'$ /DOS, (OZEMPIC, 0.25 OR 0.5 MG/DOSE,) 2 MG/1.5ML SOPN / pharmacy was suppose to request refill / pt is now out and was suppose to take his Ozempic on Monday / pt called pharmacy today again and they sent a request / pt is asking if this can be refilled today asap / please advise   [facility] #[id number] [address], [place] AT [address]  [address] [place], [place] [address]  Phone:  [phone]  Fax:  [phone]

## 2022-04-18 NOTE — Telephone Encounter (Signed)
Requested medication (s) are due for refill today: yes  Requested medication (s) are on the active medication list: yes  Last refill:  03/09/22 #30m/0  Future visit scheduled: yes 04/23/22   Notes to clinic:  Unable to refill per protocol, medication not assigned to the refill protocol.      Requested Prescriptions  Pending Prescriptions Disp Refills   OZEMPIC, 0.25 OR 0.5 MG/DOSE, 2 MG/3ML SOPN [Pharmacy Med Name: OZEMPIC 0.25 OR 0.'5MG'$ /DOS1X'2MG'$  3ML] 3 mL     Sig: INJECT 0.25 MG INTO THE SKIN ONCE A WEEK     There is no refill protocol information for this order

## 2022-04-23 ENCOUNTER — Encounter: Payer: Self-pay | Admitting: Nurse Practitioner

## 2022-04-23 ENCOUNTER — Ambulatory Visit (INDEPENDENT_AMBULATORY_CARE_PROVIDER_SITE_OTHER): Payer: Medicaid Other | Admitting: Nurse Practitioner

## 2022-04-23 VITALS — BP 120/86 | HR 73 | Temp 98.7°F | Wt 264.6 lb

## 2022-04-23 DIAGNOSIS — I10 Essential (primary) hypertension: Secondary | ICD-10-CM | POA: Diagnosis not present

## 2022-04-23 DIAGNOSIS — F419 Anxiety disorder, unspecified: Secondary | ICD-10-CM

## 2022-04-23 DIAGNOSIS — N1831 Chronic kidney disease, stage 3a: Secondary | ICD-10-CM | POA: Diagnosis not present

## 2022-04-23 DIAGNOSIS — E119 Type 2 diabetes mellitus without complications: Secondary | ICD-10-CM

## 2022-04-23 DIAGNOSIS — E7800 Pure hypercholesterolemia, unspecified: Secondary | ICD-10-CM

## 2022-04-23 DIAGNOSIS — E559 Vitamin D deficiency, unspecified: Secondary | ICD-10-CM

## 2022-04-23 MED ORDER — LOSARTAN POTASSIUM 100 MG PO TABS: 100.0000 mg | ORAL_TABLET | Freq: Every day | ORAL | 1 refills | Status: DC

## 2022-04-23 MED ORDER — AMLODIPINE BESYLATE 10 MG PO TABS: 10.0000 mg | ORAL_TABLET | Freq: Every day | ORAL | 1 refills | Status: DC

## 2022-04-23 NOTE — Assessment & Plan Note (Signed)
Chronic.  Controlled.  Continue with current medication regimen.  Labs ordered today.  Return to clinic in 6 months for reevaluation.  Call sooner if concerns arise.  ? ?

## 2022-04-23 NOTE — Assessment & Plan Note (Signed)
Chronic.  Controlled.  Continue with current medication regimen of Ozempic 0.'25mg'$ .  Labs ordered today.  Return to clinic in 6 months for reevaluation.  Call sooner if concerns arise.

## 2022-04-23 NOTE — Progress Notes (Signed)
BP 120/86   Pulse 73   Temp 98.7 F (37.1 C) (Oral)   Wt 264 lb 9.6 oz (120 kg)   SpO2 97%   BMI 40.47 kg/m    Subjective:    Patient ID: Anthony Lam, male    DOB: 1966-01-13, 56 y.o.   MRN: [id number]  HPI: Anthony Lam is a 56 y.o. male  Chief Complaint  Patient presents with   Hypertension   Hyperlipidemia   Diabetes    Pt states he has noticed when he is out in the sun for too long he starts getting dizzy. No recent eye exam per patient.     PREDIABETES Patient is on Ozempic for prediabetes as well as his CKD. It was recommended by the Nephrologist.  He had diarrhea with the Metformin. He does not check sugars at home.  Patient has been taking Ozempic.  Feels like it is working well for him.   HYPERTENSION Patient has had a long standing history of HTN.  CKD is caused by uncontrolled HTN.  Patient has been on Amlodipine, losartan, HCTZ.  Patient was at the gym prior to coming here. He hasn't been checking his blood pressures at home because the cuff he has is too small.  BACK PAIN Patient takes Gabapentin for lower back pain he only uses the medication as needed.  He does not need a refill today.     Denies HA, CP, SOB, dizziness, palpitations, visual changes, and lower extremity swelling.  Active Ambulatory Problems    Diagnosis Date Noted   Benign hypertensive renal disease 05/11/2015   Sleep apnea 09/07/2015   CKD (chronic kidney disease) stage 3, GFR 30-59 ml/min (HCC) 07/10/2018   BPH (benign prostatic hyperplasia) 08/07/2018   Back pain 08/28/2018   Essential hypertension 05/19/2019   Chronic back pain 05/19/2019   Vitamin D deficiency 02/24/2020   Anxiety 09/15/2020   Chronic pain of right knee 12/14/2020   Fluttering sensation of heart 04/25/2021   Complaint of nasal congestion 04/25/2021   Diabetes mellitus without complication ([place])    Hypercholesteremia 04/23/2022   Resolved Ambulatory Problems    Diagnosis Date Noted   No Resolved  Ambulatory Problems   Past Medical History:  Diagnosis Date   Chronic kidney disease    Hypertension    Migraines    Seizures ([place])    TB (tuberculosis), treated 2009   Past Surgical History:  Procedure Laterality Date   APPENDECTOMY     COLONOSCOPY WITH PROPOFOL N/A 01/26/2020   Procedure: COLONOSCOPY WITH PROPOFOL;  Surgeon: Jonathon Bellows, MD;  Location[facility] END[facility]COPY;  Service: Gastroenterology;  Laterality: N/A;   Family History  Problem Relation Age of Onset   Hypertension Mother    Constipation Father        chronic   Alzheimer's disease Father    Deep vein thrombosis Sister      Review of Systems  Eyes:  Negative for visual disturbance.  Respiratory:  Negative for shortness of breath.   Cardiovascular:  Negative for chest pain and leg swelling.  Musculoskeletal:  Positive for back pain.  Neurological:  Negative for light-headedness and headaches.    Per HPI unless specifically indicated above     Objective:    BP 120/86   Pulse 73   Temp 98.7 F (37.1 C) (Oral)   Wt 264 lb 9.6 oz (120 kg)   SpO2 97%   BMI 40.47 kg/m   Wt Readings from Last 3 Encounters:  04/23/22 264  lb 9.6 oz (120 kg)  10/23/21 274 lb 3.2 oz (124.4 kg)  08/07/21 275 lb (124.7 kg)    Physical Exam Vitals and nursing note reviewed.  Constitutional:      General: He is not in acute distress.    Appearance: Normal appearance. He is obese. He is not ill-appearing, toxic-appearing or diaphoretic.  HENT:     Head: Normocephalic.     Right Ear: External ear normal.     Left Ear: External ear normal.     Nose: Nose normal. No congestion or rhinorrhea.     Mouth/Throat:     Mouth: Mucous membranes are moist.  Eyes:     General:        Right eye: No discharge.        Left eye: No discharge.     Extraocular Movements: Extraocular movements intact.     Conjunctiva/sclera: Conjunctivae normal.     Pupils: Pupils are equal, round, and reactive to light.  Cardiovascular:     Rate and  Rhythm: Normal rate and regular rhythm.     Heart sounds: No murmur heard. Pulmonary:     Effort: Pulmonary effort is normal. No respiratory distress.     Breath sounds: Normal breath sounds. No wheezing, rhonchi or rales.  Abdominal:     General: Abdomen is flat. Bowel sounds are normal.  Musculoskeletal:     Cervical back: Normal range of motion and neck supple.  Skin:    General: Skin is warm and dry.     Capillary Refill: Capillary refill takes less than 2 seconds.  Neurological:     General: No focal deficit present.     Mental Status: He is alert and oriented to person, place, and time.  Psychiatric:        Mood and Affect: Mood normal.        Behavior: Behavior normal.        Thought Content: Thought content normal.        Judgment: Judgment normal.     Results for orders placed or performed in visit on 10/23/21  Microscopic Examination   Urine  Result Value Ref Range   WBC, UA None seen 0 - 5 /hpf   RBC, Urine None seen 0 - 2 /hpf   Epithelial Cells (non renal) None seen 0 - 10 /hpf   Casts Present (A) None seen /lpf   Cast Type Hyaline casts N/A   Crystals Present (A) N/A   Crystal Type Calcium Oxalate N/A   Mucus, UA Present (A) Not Estab.   Bacteria, UA Few (A) None seen/Few  TSH  Result Value Ref Range   TSH 1.340 0.450 - 4.500 uIU/mL  PSA  Result Value Ref Range   Prostate Specific Ag, Serum 0.7 0.0 - 4.0 ng/mL  Lipid panel  Result Value Ref Range   Cholesterol, Total 152 100 - 199 mg/dL   Triglycerides 146 0 - 149 mg/dL   HDL 43 >39 mg/dL   VLDL Cholesterol Cal 26 5 - 40 mg/dL   LDL Chol Calc (NIH) 83 0 - 99 mg/dL   Chol/HDL Ratio 3.5 0.0 - 5.0 ratio  CBC with Differential/Platelet  Result Value Ref Range   WBC 7.8 3.4 - 10.8 x10E3/uL   RBC 5.47 4.14 - 5.80 x10E6/uL   Hemoglobin 14.8 13.0 - 17.7 g/dL   Hematocrit 44.9 37.5 - 51.0 %   MCV 82 79 - 97 fL   MCH 27.1 26.6 - 33.0 pg   MCHC  33.0 31.5 - 35.7 g/dL   RDW 15.5 (H) 11.6 - 15.4 %    Platelets 291 150 - 450 x10E3/uL   Neutrophils 68 Not Estab. %   Lymphs 23 Not Estab. %   Monocytes 7 Not Estab. %   Eos 2 Not Estab. %   Basos 0 Not Estab. %   Neutrophils Absolute 5.3 1.4 - 7.0 x10E3/uL   Lymphocytes Absolute 1.8 0.7 - 3.1 x10E3/uL   Monocytes Absolute 0.5 0.1 - 0.9 x10E3/uL   EOS (ABSOLUTE) 0.1 0.0 - 0.4 x10E3/uL   Basophils Absolute 0.0 0.0 - 0.2 x10E3/uL   Immature Granulocytes 0 Not Estab. %   Immature Grans (Abs) 0.0 0.0 - 0.1 x10E3/uL  Comprehensive metabolic panel  Result Value Ref Range   Glucose 115 (H) 70 - 99 mg/dL   BUN 19 6 - 24 mg/dL   Creatinine, Ser 1.81 (H) 0.76 - 1.27 mg/dL   eGFR 43 (L) >59 mL/min/1.73   BUN/Creatinine Ratio 10 9 - 20   Sodium 141 134 - 144 mmol/L   Potassium 3.7 3.5 - 5.2 mmol/L   Chloride 105 96 - 106 mmol/L   CO2 22 20 - 29 mmol/L   Calcium 9.4 8.7 - 10.2 mg/dL   Total Protein 7.3 6.0 - 8.5 g/dL   Albumin 4.5 3.8 - 4.9 g/dL   Globulin, Total 2.8 1.5 - 4.5 g/dL   Albumin/Globulin Ratio 1.6 1.2 - 2.2   Bilirubin Total 0.3 0.0 - 1.2 mg/dL   Alkaline Phosphatase 89 44 - 121 IU/L   AST 13 0 - 40 IU/L   ALT 10 0 - 44 IU/L  Urinalysis, Routine w reflex microscopic  Result Value Ref Range   Specific Gravity, UA >1.030 (H) 1.005 - 1.030   pH, UA 5.5 5.0 - 7.5   Color, UA Yellow Yellow   Appearance Ur Clear Clear   Leukocytes,UA Negative Negative   Protein,UA 1+ (A) Negative/Trace   Glucose, UA Negative Negative   Ketones, UA Trace (A) Negative   RBC, UA Negative Negative   Bilirubin, UA Negative Negative   Urobilinogen, Ur 0.2 0.2 - 1.0 mg/dL   Nitrite, UA Negative Negative   Microscopic Examination See below:   Vitamin D (25 hydroxy)  Result Value Ref Range   Vit D, 25-Hydroxy 31.9 30.0 - 100.0 ng/mL  HgB A1c  Result Value Ref Range   Hgb A1c MFr Bld 5.9 (H) 4.8 - 5.6 %   Est. average glucose Bld gHb Est-mCnc 123 mg/dL  Hepatitis C antibody  Result Value Ref Range   Hep C Virus Ab <0.1 0.0 - 0.9 s/co ratio   HIV Antibody (routine testing w rflx)  Result Value Ref Range   HIV Screen 4th Generation wRfx Non Reactive Non Reactive      Assessment & Plan:   Problem List Items Addressed This Visit       Cardiovascular and Mediastinum   Essential hypertension - Primary    Chronic.  Controlled.  Continue with current medication regimen of Amlodipine 49m and Losartan 1050mdaily.  Refills sent today.  Labs ordered today.  Return to clinic in 6 months for reevaluation.  Call sooner if concerns arise.        Relevant Medications   amLODipine (NORVASC) 10 MG tablet   losartan (COZAAR) 100 MG tablet   Other Relevant Orders   Comp Met (CMET)     Endocrine   Diabetes mellitus without complication (HCC)    Chronic.  Controlled.  Continue  with current medication regimen of Ozempic 0.59m.  Labs ordered today.  Return to clinic in 6 months for reevaluation.  Call sooner if concerns arise.        Relevant Medications   losartan (COZAAR) 100 MG tablet   Other Relevant Orders   HgB A1c     Genitourinary   CKD (chronic kidney disease) stage 3, GFR 30-59 ml/min (HCC)    Chronic.  Controlled.  Continue with current medication regimen.  Labs ordered today.  Return to clinic in 6 months for reevaluation.  Call sooner if concerns arise.          Other   Vitamin D deficiency    Labs ordered today. Will make recommendations based on lab results.       Relevant Orders   Vitamin D (25 hydroxy)   Anxiety    Chronic.  Controlled.  Continue with current medication regimen.  Labs ordered today.  Return to clinic in 6 months for reevaluation.  Call sooner if concerns arise.        Hypercholesteremia    Labs ordered today. Will make recommendations based on lab results.       Relevant Medications   amLODipine (NORVASC) 10 MG tablet   losartan (COZAAR) 100 MG tablet   Other Relevant Orders   Lipid Profile     Follow up plan: Return in about 6 months (around 10/24/2022) for Physical and  Fasting labs.

## 2022-04-23 NOTE — Assessment & Plan Note (Signed)
Labs ordered today.  Will make recommendations based on lab results. ?

## 2022-04-23 NOTE — Assessment & Plan Note (Signed)
Chronic.  Controlled.  Continue with current medication regimen of Amlodipine '10mg'$  and Losartan '100mg'$  daily.  Refills sent today.  Labs ordered today.  Return to clinic in 6 months for reevaluation.  Call sooner if concerns arise.

## 2022-04-24 LAB — LIPID PANEL
Chol/HDL Ratio: 3.4 ratio (ref 0.0–5.0)
Cholesterol, Total: 153 mg/dL (ref 100–199)
HDL: 45 mg/dL
LDL Chol Calc (NIH): 79 mg/dL (ref 0–99)
Triglycerides: 169 mg/dL — ABNORMAL HIGH (ref 0–149)
VLDL Cholesterol Cal: 29 mg/dL (ref 5–40)

## 2022-04-24 LAB — COMPREHENSIVE METABOLIC PANEL WITH GFR
ALT: 9 IU/L (ref 0–44)
AST: 10 IU/L (ref 0–40)
Albumin/Globulin Ratio: 1.4 (ref 1.2–2.2)
Albumin: 4.6 g/dL (ref 3.8–4.9)
Alkaline Phosphatase: 84 IU/L (ref 44–121)
BUN/Creatinine Ratio: 9 (ref 9–20)
BUN: 15 mg/dL (ref 6–24)
Bilirubin Total: 0.4 mg/dL (ref 0.0–1.2)
CO2: 21 mmol/L (ref 20–29)
Calcium: 9.4 mg/dL (ref 8.7–10.2)
Chloride: 104 mmol/L (ref 96–106)
Creatinine, Ser: 1.61 mg/dL — ABNORMAL HIGH (ref 0.76–1.27)
Globulin, Total: 3.2 g/dL (ref 1.5–4.5)
Glucose: 100 mg/dL — ABNORMAL HIGH (ref 70–99)
Potassium: 3.9 mmol/L (ref 3.5–5.2)
Sodium: 139 mmol/L (ref 134–144)
Total Protein: 7.8 g/dL (ref 6.0–8.5)
eGFR: 50 mL/min/1.73 — ABNORMAL LOW

## 2022-04-24 LAB — VITAMIN D 25 HYDROXY (VIT D DEFICIENCY, FRACTURES): Vit D, 25-Hydroxy: 39.4 ng/mL (ref 30.0–100.0)

## 2022-04-24 LAB — HEMOGLOBIN A1C
Est. average glucose Bld gHb Est-mCnc: 117 mg/dL
Hgb A1c MFr Bld: 5.7 % — ABNORMAL HIGH (ref 4.8–5.6)

## 2022-04-24 NOTE — Progress Notes (Signed)
Hi Anthony Lam. It was nice to see you yesterday.  Your lab work looks good.  Your A1c is well controlled at 5.7.  Keep up the good work.  Your kidney function improved slightly.  Continue with your current medication regimen.  Follow up as discussed.  Please let me know if you have any questions.

## 2022-05-14 ENCOUNTER — Other Ambulatory Visit: Payer: Self-pay | Admitting: Nurse Practitioner

## 2022-05-14 DIAGNOSIS — R7303 Prediabetes: Secondary | ICD-10-CM

## 2022-05-14 NOTE — Telephone Encounter (Signed)
Medication Refill - Medication:  OZEMPIC, 0.25 OR 0.5 MG/DOSE, 2 MG/3ML SOPN 3 mL 0 04/19/2022    Has the patient contacted their pharmacy? Yes.   (Agent: If no, request that the patient contact the pharmacy for the refill. If patient does not wish to contact the pharmacy document the reason why and proceed with request.) (Agent: If yes, when and what did the pharmacy advise?) told pt to always call dr first  Preferred Pharmacy (with phone number or street name):  [id number] [address], [address] [place] [address]  [place] [address]  Phone: [phone] Fax: [phone]   Has the patient been seen for an appointment in the last year OR does the patient have an upcoming appointment? Yes.    Agent: Please be advised that RX refills may take up to 3 business days. We ask that you follow-up with your pharmacy.

## 2022-05-15 DIAGNOSIS — Z419 Encounter for procedure for purposes other than remedying health state, unspecified: Secondary | ICD-10-CM | POA: Diagnosis not present

## 2022-05-16 MED ORDER — OZEMPIC (0.25 OR 0.5 MG/DOSE) 2 MG/3ML ~~LOC~~ SOPN: 0.2500 mg | PEN_INJECTOR | SUBCUTANEOUS | 2 refills | Status: DC

## 2022-05-16 NOTE — Telephone Encounter (Signed)
.   Requested Prescriptions  Pending Prescriptions Disp Refills  . Semaglutide,0.25 or 0.'5MG'$ /DOS, (OZEMPIC, 0.25 OR 0.5 MG/DOSE,) 2 MG/3ML SOPN 3 mL 2    Sig: Inject 0.25 mg into the skin once a week.     Endocrinology:  Diabetes - GLP-1 Receptor Agonists - semaglutide Failed - 05/15/2022  4:14 PM      Failed - HBA1C in normal range and within 180 days    Hemoglobin A1C  Date Value Ref Range Status  05/11/2015 6.1  Final   Hgb A1c MFr Bld  Date Value Ref Range Status  04/23/2022 5.7 (H) 4.8 - 5.6 % Final    Comment:             Prediabetes: 5.7 - 6.4          Diabetes: >6.4          Glycemic control for adults with diabetes: <7.0          Failed - Cr in normal range and within 360 days    Creatinine  Date Value Ref Range Status  08/04/2013 1.68 (H) 0.60 - 1.30 mg/dL Final   Creatinine, Ser  Date Value Ref Range Status  04/23/2022 1.61 (H) 0.76 - 1.27 mg/dL Final         Passed - Valid encounter within last 6 months    Recent Outpatient Visits          3 weeks ago Essential hypertension   [facility] Jon Billings, NP   6 months ago Annual physical exam   [facility] Jon Billings, NP   9 months ago Stage 3a chronic kidney disease ([place])   [place] Jon Billings, NP      Future Appointments            In 5 months Jon Billings, NP [facility], [place]

## 2022-05-30 DIAGNOSIS — I10 Essential (primary) hypertension: Secondary | ICD-10-CM | POA: Diagnosis not present

## 2022-06-07 DIAGNOSIS — I10 Essential (primary) hypertension: Secondary | ICD-10-CM | POA: Diagnosis not present

## 2022-06-07 DIAGNOSIS — Z6839 Body mass index (BMI) 39.0-39.9, adult: Secondary | ICD-10-CM | POA: Diagnosis not present

## 2022-06-07 DIAGNOSIS — N1831 Chronic kidney disease, stage 3a: Secondary | ICD-10-CM | POA: Diagnosis not present

## 2022-06-07 DIAGNOSIS — E669 Obesity, unspecified: Secondary | ICD-10-CM | POA: Diagnosis not present

## 2022-06-15 DIAGNOSIS — Z419 Encounter for procedure for purposes other than remedying health state, unspecified: Secondary | ICD-10-CM | POA: Diagnosis not present

## 2022-07-12 DIAGNOSIS — E113393 Type 2 diabetes mellitus with moderate nonproliferative diabetic retinopathy without macular edema, bilateral: Secondary | ICD-10-CM | POA: Diagnosis not present

## 2022-07-13 DIAGNOSIS — H5213 Myopia, bilateral: Secondary | ICD-10-CM | POA: Diagnosis not present

## 2022-07-15 DIAGNOSIS — Z419 Encounter for procedure for purposes other than remedying health state, unspecified: Secondary | ICD-10-CM | POA: Diagnosis not present

## 2022-07-26 NOTE — Progress Notes (Signed)
BP 125/77   Pulse 61   Temp 98.4 F (36.9 C) (Oral)   Wt 267 lb 4.8 oz (121.2 kg)   SpO2 96%   BMI 40.88 kg/m    Subjective:    Patient ID: Anthony Lam, male    DOB: [birth year], 56 y.o.   MRN: [id number]  HPI: Anthony Lam is a 56 y.o. male  Chief Complaint  Patient presents with   Knee Pain     Onset 1 month ago. L knee pain denies recent fall/injury. OTC icy/hot   KNEE PAIN Duration: weeks Involved knee: left Mechanism of injury: unknown Location:diffuse Onset: sudden Severity: moderate  Quality:  dull Frequency: intermittent Radiation: no Aggravating factors: walking and running  Alleviating factors:  icy hot   Status: worse Treatments attempted:  icy hot   Relief with NSAIDs?:  no Weakness with weight bearing or walking: yes Sensation of giving way: no Locking: no Popping: no Bruising: no Swelling: no Redness: no Paresthesias/decreased sensation: no Fevers: no  Relevant past medical, surgical, family and social history reviewed and updated as indicated. Interim medical history since our last visit reviewed. Allergies and medications reviewed and updated.  Review of Systems  Musculoskeletal:        Left knee pain    Per HPI unless specifically indicated above     Objective:    BP 125/77   Pulse 61   Temp 98.4 F (36.9 C) (Oral)   Wt 267 lb 4.8 oz (121.2 kg)   SpO2 96%   BMI 40.88 kg/m   Wt Readings from Last 3 Encounters:  07/27/22 267 lb 4.8 oz (121.2 kg)  04/23/22 264 lb 9.6 oz (120 kg)  10/23/21 274 lb 3.2 oz (124.4 kg)    Physical Exam Vitals and nursing note reviewed.  Constitutional:      General: He is not in acute distress.    Appearance: Normal appearance. He is not ill-appearing, toxic-appearing or diaphoretic.  HENT:     Head: Normocephalic.     Right Ear: External ear normal.     Left Ear: External ear normal.     Nose: Nose normal. No congestion or rhinorrhea.     Mouth/Throat:     Mouth: Mucous membranes are  moist.  Eyes:     General:        Right eye: No discharge.        Left eye: No discharge.     Extraocular Movements: Extraocular movements intact.     Conjunctiva/sclera: Conjunctivae normal.     Pupils: Pupils are equal, round, and reactive to light.  Cardiovascular:     Rate and Rhythm: Normal rate and regular rhythm.     Heart sounds: No murmur heard. Pulmonary:     Effort: Pulmonary effort is normal. No respiratory distress.     Breath sounds: Normal breath sounds. No wheezing, rhonchi or rales.  Abdominal:     General: Abdomen is flat. Bowel sounds are normal.  Musculoskeletal:     Cervical back: Normal range of motion and neck supple.     Left knee: Swelling present. Normal range of motion. No tenderness.  Skin:    General: Skin is warm and dry.     Capillary Refill: Capillary refill takes less than 2 seconds.  Neurological:     General: No focal deficit present.     Mental Status: He is alert and oriented to person, place, and time.  Psychiatric:        Mood  and Affect: Mood normal.        Behavior: Behavior normal.        Thought Content: Thought content normal.        Judgment: Judgment normal.     Results for orders placed or performed in visit on 04/23/22  Comp Met (CMET)  Result Value Ref Range   Glucose 100 (H) 70 - 99 mg/dL   BUN 15 6 - 24 mg/dL   Creatinine, Ser 1.61 (H) 0.76 - 1.27 mg/dL   eGFR 50 (L) >59 mL/min/1.73   BUN/Creatinine Ratio 9 9 - 20   Sodium 139 134 - 144 mmol/L   Potassium 3.9 3.5 - 5.2 mmol/L   Chloride 104 96 - 106 mmol/L   CO2 21 20 - 29 mmol/L   Calcium 9.4 8.7 - 10.2 mg/dL   Total Protein 7.8 6.0 - 8.5 g/dL   Albumin 4.6 3.8 - 4.9 g/dL   Globulin, Total 3.2 1.5 - 4.5 g/dL   Albumin/Globulin Ratio 1.4 1.2 - 2.2   Bilirubin Total 0.4 0.0 - 1.2 mg/dL   Alkaline Phosphatase 84 44 - 121 IU/L   AST 10 0 - 40 IU/L   ALT 9 0 - 44 IU/L  Lipid Profile  Result Value Ref Range   Cholesterol, Total 153 100 - 199 mg/dL   Triglycerides  169 (H) 0 - 149 mg/dL   HDL 45 >39 mg/dL   VLDL Cholesterol Cal 29 5 - 40 mg/dL   LDL Chol Calc (NIH) 79 0 - 99 mg/dL   Chol/HDL Ratio 3.4 0.0 - 5.0 ratio  HgB A1c  Result Value Ref Range   Hgb A1c MFr Bld 5.7 (H) 4.8 - 5.6 %   Est. average glucose Bld gHb Est-mCnc 117 mg/dL  VITAMIN D 25 Hydroxy (Vit-D Deficiency, Fractures)  Result Value Ref Range   Vit D, 25-Hydroxy 39.4 30.0 - 100.0 ng/mL      Assessment & Plan:   Problem List Items Addressed This Visit       Other   Hypercholesteremia - Primary    Chronic. Will start Crestor 53m daily.  Side effects and benefits of medication discussed during visit. Will order labs at next visit.  Follow up in January at regularly scheduled appt.       Relevant Medications   rosuvastatin (CRESTOR) 5 MG tablet   Other Visit Diagnoses     Acute pain of left knee       Will treat with voltaren gel.  Will obtain xray for evaluation. May need steroid injection. Will make recommendations based on lab results.    Relevant Medications   diclofenac Sodium (VOLTAREN) 1 % GEL   Other Relevant Orders   DG Knee Complete 4 Views Left   Positive depression screening       Feels like the knee pain is getting him down.  Does not feel like he needs medication at this time.  Encouraged patient to follow up if symptoms worsen.   Need for influenza vaccination       Relevant Orders   Flu Vaccine QUAD 6+ mos PF IM (Fluarix Quad PF)        Follow up plan: Return if symptoms worsen or fail to improve.

## 2022-07-27 ENCOUNTER — Telehealth: Payer: Self-pay | Admitting: Nurse Practitioner

## 2022-07-27 ENCOUNTER — Ambulatory Visit (INDEPENDENT_AMBULATORY_CARE_PROVIDER_SITE_OTHER): Payer: Medicaid Other | Admitting: Nurse Practitioner

## 2022-07-27 ENCOUNTER — Encounter: Payer: Self-pay | Admitting: Nurse Practitioner

## 2022-07-27 VITALS — BP 125/77 | HR 61 | Temp 98.4°F | Wt 267.3 lb

## 2022-07-27 DIAGNOSIS — Z23 Encounter for immunization: Secondary | ICD-10-CM

## 2022-07-27 DIAGNOSIS — M25562 Pain in left knee: Secondary | ICD-10-CM

## 2022-07-27 DIAGNOSIS — E7800 Pure hypercholesterolemia, unspecified: Secondary | ICD-10-CM | POA: Diagnosis not present

## 2022-07-27 DIAGNOSIS — Z1331 Encounter for screening for depression: Secondary | ICD-10-CM

## 2022-07-27 MED ORDER — DICLOFENAC SODIUM 1 % EX GEL
4.0000 g | Freq: Four times a day (QID) | CUTANEOUS | 1 refills | Status: DC
Start: 2022-07-27 — End: 2022-10-24

## 2022-07-27 MED ORDER — IRBESARTAN 300 MG PO TABS: 300.0000 mg | ORAL_TABLET | Freq: Every day | ORAL | 0 refills | Status: DC

## 2022-07-27 MED ORDER — ROSUVASTATIN CALCIUM 5 MG PO TABS: 5.0000 mg | ORAL_TABLET | Freq: Every day | ORAL | 1 refills | Status: DC

## 2022-07-27 NOTE — Telephone Encounter (Signed)
Please let patient know that I updated his medication list.

## 2022-07-27 NOTE — Assessment & Plan Note (Signed)
Chronic. Will start Crestor '5mg'$  daily.  Side effects and benefits of medication discussed during visit. Will order labs at next visit.  Follow up in January at regularly scheduled appt.

## 2022-07-27 NOTE — Telephone Encounter (Signed)
Pt called in for assistance. Pt says that his BP medication was changed by his kidney Dr. Abbott Pao had a visit with PCP today and forgot to update her of this change. Pt says that he is now taking Irbesartan 300 MG. Pt would like to have his chart updated with this new BP medication. Pt says that he was told by kidney provider to no longer take Losartan '100mg'$ .

## 2022-07-30 ENCOUNTER — Ambulatory Visit
Admission: RE | Admit: 2022-07-30 | Discharge: 2022-07-30 | Disposition: A | Discharge status: Home / Self Care | Payer: Medicaid Other | Attending: Nurse Practitioner | Admitting: Nurse Practitioner

## 2022-07-30 ENCOUNTER — Ambulatory Visit
Admission: RE | Admit: 2022-07-30 | Discharge: 2022-07-30 | Disposition: A | Discharge status: Home / Self Care | Payer: Medicaid Other | Source: Ambulatory Visit | Attending: Nurse Practitioner | Admitting: Nurse Practitioner

## 2022-07-30 DIAGNOSIS — M25562 Pain in left knee: Secondary | ICD-10-CM

## 2022-07-30 NOTE — Telephone Encounter (Signed)
Patient contacted via telephone.

## 2022-07-31 ENCOUNTER — Telehealth: Payer: Self-pay | Admitting: Nurse Practitioner

## 2022-07-31 NOTE — Telephone Encounter (Signed)
Advised patient I will call when report has been read by K. Cisco

## 2022-07-31 NOTE — Telephone Encounter (Signed)
Copied from [place] [phone] [id number]. Topic: General - Other >> Jul 31, 2022 10:29 AM Cyndi Bender wrote: Reason for CRM: Pt requests call back to go over his x-ray results. Cb# [phone]

## 2022-08-01 NOTE — Addendum Note (Signed)
Addended by: Jon Billings on: 08/01/2022 07:59 AM   Modules accepted: Orders

## 2022-08-01 NOTE — Progress Notes (Signed)
The referral has already been placed and sent to [facility].

## 2022-08-01 NOTE — Progress Notes (Signed)
Please let patient know that his xray shows that he has some swelling in his knee.  There is a bone spur on his quadriceps tendon.  I recommend he see Orthopedics.  I have placed the referral.

## 2022-08-03 ENCOUNTER — Other Ambulatory Visit: Payer: Self-pay | Admitting: Nurse Practitioner

## 2022-08-03 DIAGNOSIS — R7303 Prediabetes: Secondary | ICD-10-CM

## 2022-08-03 MED ORDER — OZEMPIC (0.25 OR 0.5 MG/DOSE) 2 MG/3ML ~~LOC~~ SOPN: 0.2500 mg | PEN_INJECTOR | SUBCUTANEOUS | 2 refills | Status: DC

## 2022-08-03 NOTE — Telephone Encounter (Signed)
Medication Refill - Medication: Semaglutide,0.25 or 0.'5MG'$ /DOS, (OZEMPIC, 0.25 OR 0.5 MG/DOSE,) 2 MG/3ML SOPN  Has the patient contacted their pharmacy? Yes.     Preferred Pharmacy (with phone number or street name):  [facility] #[id number], [place] Phone:  [phone]  Fax:  [phone]     Has the patient been seen for an appointment in the last year OR does the patient have an upcoming appointment? Yes.     Agent: Please be advised that RX refills may take up to 3 business days. We ask that you follow-up with your pharmacy.

## 2022-08-03 NOTE — Telephone Encounter (Signed)
Requested Prescriptions  Pending Prescriptions Disp Refills  . Semaglutide,0.25 or 0.'5MG'$ /DOS, (OZEMPIC, 0.25 OR 0.5 MG/DOSE,) 2 MG/3ML SOPN 3 mL 2    Sig: Inject 0.25 mg into the skin once a week.     Endocrinology:  Diabetes - GLP-1 Receptor Agonists - semaglutide Failed - 08/03/2022 12:29 PM      Failed - HBA1C in normal range and within 180 days    Hemoglobin A1C  Date Value Ref Range Status  05/11/2015 6.1  Final   Hgb A1c MFr Bld  Date Value Ref Range Status  04/23/2022 5.7 (H) 4.8 - 5.6 % Final    Comment:             Prediabetes: 5.7 - 6.4          Diabetes: >6.4          Glycemic control for adults with diabetes: <7.0          Failed - Cr in normal range and within 360 days    Creatinine  Date Value Ref Range Status  08/04/2013 1.68 (H) 0.60 - 1.30 mg/dL Final   Creatinine, Ser  Date Value Ref Range Status  04/23/2022 1.61 (H) 0.76 - 1.27 mg/dL Final         Passed - Valid encounter within last 6 months    Recent Outpatient Visits          1 week ago [place], NP   3 months ago Essential hypertension   [facility] Jon Billings, NP   9 months ago Annual physical exam   [facility] Jon Billings, NP   12 months ago Stage 3a chronic kidney disease [facility])   [place] Jon Billings, NP      Future Appointments            In 2 months Jon Billings, NP [facility], [place]

## 2022-08-10 DIAGNOSIS — Z7689 Persons encountering health services in other specified circumstances: Secondary | ICD-10-CM | POA: Diagnosis not present

## 2022-08-10 DIAGNOSIS — S86912A Strain of unspecified muscle(s) and tendon(s) at lower leg level, left leg, initial encounter: Secondary | ICD-10-CM | POA: Diagnosis not present

## 2022-08-15 DIAGNOSIS — Z419 Encounter for procedure for purposes other than remedying health state, unspecified: Secondary | ICD-10-CM | POA: Diagnosis not present

## 2022-09-11 DIAGNOSIS — I10 Essential (primary) hypertension: Secondary | ICD-10-CM | POA: Diagnosis not present

## 2022-09-12 DIAGNOSIS — Z7689 Persons encountering health services in other specified circumstances: Secondary | ICD-10-CM | POA: Diagnosis not present

## 2022-09-12 DIAGNOSIS — S86912A Strain of unspecified muscle(s) and tendon(s) at lower leg level, left leg, initial encounter: Secondary | ICD-10-CM | POA: Diagnosis not present

## 2022-09-12 DIAGNOSIS — M7042 Prepatellar bursitis, left knee: Secondary | ICD-10-CM | POA: Diagnosis not present

## 2022-09-12 DIAGNOSIS — S83412A Sprain of medial collateral ligament of left knee, initial encounter: Secondary | ICD-10-CM | POA: Diagnosis not present

## 2022-09-12 DIAGNOSIS — M67864 Other specified disorders of tendon, left knee: Secondary | ICD-10-CM | POA: Diagnosis not present

## 2022-09-14 DIAGNOSIS — S86912A Strain of unspecified muscle(s) and tendon(s) at lower leg level, left leg, initial encounter: Secondary | ICD-10-CM | POA: Diagnosis not present

## 2022-09-14 DIAGNOSIS — S86912D Strain of unspecified muscle(s) and tendon(s) at lower leg level, left leg, subsequent encounter: Secondary | ICD-10-CM | POA: Diagnosis not present

## 2022-09-14 DIAGNOSIS — Z7689 Persons encountering health services in other specified circumstances: Secondary | ICD-10-CM | POA: Diagnosis not present

## 2022-09-14 DIAGNOSIS — Z419 Encounter for procedure for purposes other than remedying health state, unspecified: Secondary | ICD-10-CM | POA: Diagnosis not present

## 2022-10-15 DIAGNOSIS — Z419 Encounter for procedure for purposes other than remedying health state, unspecified: Secondary | ICD-10-CM | POA: Diagnosis not present

## 2022-10-16 ENCOUNTER — Other Ambulatory Visit: Payer: Self-pay | Admitting: Nurse Practitioner

## 2022-10-16 DIAGNOSIS — I10 Essential (primary) hypertension: Secondary | ICD-10-CM

## 2022-10-17 ENCOUNTER — Telehealth: Payer: Self-pay | Admitting: Nurse Practitioner

## 2022-10-17 NOTE — Telephone Encounter (Signed)
Patient states that he is out of his c pap supplies and is needing his PCP to contact [facility] in order for him to get his supplies.  Please advise  [facility] Phone Number: [phone]

## 2022-10-17 NOTE — Telephone Encounter (Signed)
Requested Prescriptions  Pending Prescriptions Disp Refills   amLODipine (NORVASC) 10 MG tablet [Pharmacy Med Name: AMLODIPINE BESYLATE '10MG'$  TABLETS] 90 tablet 0    Sig: TAKE 1 TABLET(10 MG) BY MOUTH DAILY     Cardiovascular: Calcium Channel Blockers 2 Passed - 10/16/2022  6:23 AM      Passed - Last BP in normal range    BP Readings from Last 1 Encounters:  07/27/22 125/77         Passed - Last Heart Rate in normal range    Pulse Readings from Last 1 Encounters:  07/27/22 61         Passed - Valid encounter within last 6 months    Recent Outpatient Visits           2 months ago [place], NP   5 months ago Essential hypertension   [facility] Jon Billings, NP   11 months ago Annual physical exam   [facility] Jon Billings, NP   1 year ago Stage 3a chronic kidney disease ([place])   [place] Jon Billings, NP       Future Appointments             In 1 week Jon Billings, NP [facility], [place]

## 2022-10-19 NOTE — Telephone Encounter (Signed)
[facility] and was advised that an order for CPAP supplies need to be signed and faxed over. Fax number [phone].    Order written, signed by [name], and faxed to [facility].

## 2022-10-22 NOTE — Telephone Encounter (Signed)
Called and notified patient that a new prescription has been sent in as requested for new CPAP supplies.

## 2022-10-24 ENCOUNTER — Ambulatory Visit (INDEPENDENT_AMBULATORY_CARE_PROVIDER_SITE_OTHER): Payer: Medicaid Other | Admitting: Nurse Practitioner

## 2022-10-24 ENCOUNTER — Encounter: Payer: Self-pay | Admitting: Nurse Practitioner

## 2022-10-24 VITALS — BP 139/85 | HR 72 | Temp 97.8°F | Ht 69.0 in | Wt 270.8 lb

## 2022-10-24 DIAGNOSIS — I10 Essential (primary) hypertension: Secondary | ICD-10-CM | POA: Diagnosis not present

## 2022-10-24 DIAGNOSIS — G4730 Sleep apnea, unspecified: Secondary | ICD-10-CM

## 2022-10-24 DIAGNOSIS — E7800 Pure hypercholesterolemia, unspecified: Secondary | ICD-10-CM | POA: Diagnosis not present

## 2022-10-24 DIAGNOSIS — Z1211 Encounter for screening for malignant neoplasm of colon: Secondary | ICD-10-CM

## 2022-10-24 DIAGNOSIS — N1831 Chronic kidney disease, stage 3a: Secondary | ICD-10-CM | POA: Diagnosis not present

## 2022-10-24 DIAGNOSIS — E119 Type 2 diabetes mellitus without complications: Secondary | ICD-10-CM | POA: Diagnosis not present

## 2022-10-24 DIAGNOSIS — Z0000 Encounter for general adult medical examination without abnormal findings: Secondary | ICD-10-CM

## 2022-10-24 DIAGNOSIS — E559 Vitamin D deficiency, unspecified: Secondary | ICD-10-CM | POA: Diagnosis not present

## 2022-10-24 LAB — URINALYSIS, ROUTINE W REFLEX MICROSCOPIC
Bilirubin, UA: NEGATIVE
Glucose, UA: NEGATIVE
Ketones, UA: NEGATIVE
Leukocytes,UA: NEGATIVE
Nitrite, UA: NEGATIVE
RBC, UA: NEGATIVE
Specific Gravity, UA: 1.025 (ref 1.005–1.030)
Urobilinogen, Ur: 0.2 mg/dL (ref 0.2–1.0)
pH, UA: 5.5 (ref 5.0–7.5)

## 2022-10-24 LAB — MICROALBUMIN, URINE WAIVED
Creatinine, Urine Waived: 200 mg/dL (ref 10–300)
Microalb, Ur Waived: 80 mg/L — ABNORMAL HIGH (ref 0–19)

## 2022-10-24 LAB — MICROSCOPIC EXAMINATION
Bacteria, UA: NONE SEEN
Epithelial Cells (non renal): NONE SEEN /hpf (ref 0–10)
RBC, Urine: NONE SEEN /hpf (ref 0–2)

## 2022-10-24 NOTE — Assessment & Plan Note (Signed)
Will order updated sleep study.  May need updated settings.

## 2022-10-24 NOTE — Assessment & Plan Note (Signed)
Chronic.  Controlled.  Continue with current medication regimen.  Labs ordered today.  Return to clinic in 6 months for reevaluation.  Call sooner if concerns arise.  ? ?

## 2022-10-24 NOTE — Assessment & Plan Note (Signed)
Labs ordered at visit today.  Will make recommendations based on lab results.   

## 2022-10-24 NOTE — Assessment & Plan Note (Signed)
Chronic. Continue with Crestor '5mg'$  daily.  Labs ordered today.  Follow up in 6 months.  Call sooner if concerns arise.

## 2022-10-24 NOTE — Progress Notes (Signed)
BP 139/85   Pulse 72   Temp 97.8 F (36.6 C) (Oral)   Ht '5\' 9"'$  (1.753 m)   Wt 270 lb 12.8 oz (122.8 kg)   SpO2 98%   BMI 39.99 kg/m    Subjective:    Patient ID: Anthony Lam, male    DOB: June 21, 1966, 57 y.o.   MRN: [id number]  HPI: Anthony Lam is a 57 y.o. male presenting on 10/24/2022 for comprehensive medical examination. Current medical complaints include:none  He currently lives with: Interim Problems from his last visit: no  HYPERTENSION Hypertension status: controlled  Satisfied with current treatment? no Duration of hypertension: years BP monitoring frequency:  weekly BP range: 130/80 BP medication side effects:  no Medication compliance: excellent compliance Previous BP meds:amlodipine, HCTZ, and losartan (cozaar) Aspirin: no Recurrent headaches: no Visual changes: no Palpitations: no Dyspnea: no Chest pain: no Lower extremity edema: no Dizzy/lightheaded: no  DIABETES Hypoglycemic episodes:no Polydipsia/polyuria: no Visual disturbance: no Chest pain: no Paresthesias: no Glucose Monitoring: no  Accucheck frequency: Not Checking  Fasting glucose:  Post prandial:  Evening:  Before meals: Taking Insulin?: no  Long acting insulin:  Short acting insulin: Blood Pressure Monitoring: not checking Retinal Examination: Not up to Date Foot Exam: Up to Date Diabetic Education: Not Completed Pneumovax: Up to Date Influenza: Up to Date Aspirin: no  CHRONIC KIDNEY DISEASE CKD status: controlled Medications renally dose: no Previous renal evaluation: no Pneumovax:  Up to Date Influenza Vaccine:  Up to Date  Patient states he has been using his CPAP machine and he has been waking up still tired.  He used to wake feeling refreshed.  Patient states he is having a lot of dry mouth which also wakes him up at night.    Depression Screen done today and results listed below:     10/24/2022    2:01 PM 04/23/2022    3:11 PM 10/23/2021    2:44 PM 08/07/2021     3:00 PM 06/20/2021    9:31 AM  Depression screen PHQ 2/9  Decreased Interest 2 0 0  0  Down, Depressed, Hopeless 1 1 0  2  PHQ - 2 Score 3 1 0  2  Altered sleeping '3 1 1 '$ 0 1  Tired, decreased energy '3 1 1 1 3  '$ Change in appetite 0 0 1 0 1  Feeling bad or failure about yourself  0 0 0 0 0  Trouble concentrating '2 1 1 2 3  '$ Moving slowly or fidgety/restless 0 0 0 0 0  Suicidal thoughts 0 0 0 0 0  PHQ-9 Score '11 4 4  10  '$ Difficult doing work/chores Somewhat difficult Somewhat difficult Not difficult at all      The patient does not have a history of falls. I did complete a risk assessment for falls. A plan of care for falls was documented.   Past Medical History:  Past Medical History:  Diagnosis Date   Chronic kidney disease    stage 3a   Diabetes mellitus without complication ([place])    Hypertension    Migraines    Seizures ([place])    Sleep apnea    TB (tuberculosis), treated 2009    Surgical History:  Past Surgical History:  Procedure Laterality Date   APPENDECTOMY     COLONOSCOPY WITH PROPOFOL N/A 01/26/2020   Procedure: COLONOSCOPY WITH PROPOFOL;  Surgeon: Jonathon Bellows, MD;  Location: [facility] ENDOSCOPY;  Service: Gastroenterology;  Laterality: N/A;    Medications:  Current  Outpatient Medications on File Prior to Visit  Medication Sig   amLODipine (NORVASC) 10 MG tablet TAKE 1 TABLET(10 MG) BY MOUTH DAILY   Blood Glucose Monitoring Suppl (BLOOD GLUCOSE MONITOR SYSTEM) w/Device KIT USE AS DIRECTED.   glucose blood (RIGHTEST GS550 BLOOD GLUCOSE) test strip USE AS DIRECTED.   irbesartan (AVAPRO) 300 MG tablet Take 1 tablet (300 mg total) by mouth daily.   Rightest GL300 Lancets MISC USE AS DIRECTED.   rosuvastatin (CRESTOR) 5 MG tablet Take 1 tablet (5 mg total) by mouth daily.   Semaglutide,0.25 or 0.'5MG'$ /DOS, (OZEMPIC, 0.25 OR 0.5 MG/DOSE,) 2 MG/3ML SOPN Inject 0.25 mg into the skin once a week.   fluticasone (FLONASE) 50 MCG/ACT nasal spray SPRAY 1 SPRAY IN EACH NOSTRIL  ONCE DAILY.   gabapentin (NEURONTIN) 100 MG capsule TAKE ONE CAPSULE BY MOUTH 3 TIMES A DAY   No current facility-administered medications on file prior to visit.    Allergies:  Allergies  Allergen Reactions   Metformin And Related Diarrhea    Social History:  Social History   Socioeconomic History   Marital status: Single    Spouse name: Not on file   Number of children: 2   Years of education: Not on file   Highest education level: 12th grade  Occupational History   Not on file  Tobacco Use   Smoking status: Some Days    Years: 15.00    Types: Cigarettes   Smokeless tobacco: Never   Tobacco comments:    The only time he smokes is when he has a beer 1 cigarette. 1 pack of cigarettes lasts ~2 weeks.  Vaping Use   Vaping Use: Never used  Substance and Sexual Activity   Alcohol use: Yes    Alcohol/week: 1.0 standard drink of alcohol    Types: 1 Cans of beer per week    Comment: 1-2 beers a week   Drug use: Never   Sexual activity: Yes  Other Topics Concern   Not on file  Social History Narrative   Not on file   Social Determinants of Health   Financial Resource Strain: High Risk (07/10/2018)   Overall Financial Resource Strain (CARDIA)    Difficulty of Paying Living Expenses: Very hard  Food Insecurity: No Food Insecurity (04/12/2021)   Hunger Vital Sign    Worried About Running Out of Food in the Last Year: Never true    Ran Out of Food in the Last Year: Never true  Transportation Needs: No Transportation Needs (04/12/2021)   PRAPARE - Hydrologist (Medical): No    Lack of Transportation (Non-Medical): No  Physical Activity: Not on file  Stress: Not on file  Social Connections: Not on file  Intimate Partner Violence: Not on file   Social History   Tobacco Use  Smoking Status Some Days   Years: 15.00   Types: Cigarettes  Smokeless Tobacco Never  Tobacco Comments   The only time he smokes is when he has a beer 1 cigarette. 1  pack of cigarettes lasts ~2 weeks.   Social History   Substance and Sexual Activity  Alcohol Use Yes   Alcohol/week: 1.0 standard drink of alcohol   Types: 1 Cans of beer per week   Comment: 1-2 beers a week    Family History:  Family History  Problem Relation Age of Onset   Hypertension Mother    Constipation Father        chronic   Alzheimer's disease  Father    Deep vein thrombosis Sister     Past medical history, surgical history, medications, allergies, family history and social history reviewed with patient today and changes made to appropriate areas of the chart.   Review of Systems  Constitutional:  Positive for malaise/fatigue.  HENT:         Denies vision changes.  Eyes:  Negative for blurred vision and double vision.  Respiratory:  Negative for shortness of breath.   Cardiovascular:  Negative for chest pain, palpitations and leg swelling.  Neurological:  Negative for dizziness, tingling and headaches.  Endo/Heme/Allergies:  Negative for polydipsia.       Denies Polyuria   All other ROS negative except what is listed above and in the HPI.      Objective:    BP 139/85   Pulse 72   Temp 97.8 F (36.6 C) (Oral)   Ht '5\' 9"'$  (1.753 m)   Wt 270 lb 12.8 oz (122.8 kg)   SpO2 98%   BMI 39.99 kg/m   Wt Readings from Last 3 Encounters:  10/24/22 270 lb 12.8 oz (122.8 kg)  07/27/22 267 lb 4.8 oz (121.2 kg)  04/23/22 264 lb 9.6 oz (120 kg)    Physical Exam Vitals and nursing note reviewed.  Constitutional:      General: He is not in acute distress.    Appearance: Normal appearance. He is obese. He is not ill-appearing, toxic-appearing or diaphoretic.  HENT:     Head: Normocephalic.     Right Ear: Tympanic membrane, ear canal and external ear normal.     Left Ear: Tympanic membrane, ear canal and external ear normal.     Nose: Nose normal. No congestion or rhinorrhea.     Mouth/Throat:     Mouth: Mucous membranes are moist.  Eyes:     General:        Right  eye: No discharge.        Left eye: No discharge.     Extraocular Movements: Extraocular movements intact.     Conjunctiva/sclera: Conjunctivae normal.     Pupils: Pupils are equal, round, and reactive to light.  Cardiovascular:     Rate and Rhythm: Normal rate and regular rhythm.     Heart sounds: No murmur heard. Pulmonary:     Effort: Pulmonary effort is normal. No respiratory distress.     Breath sounds: Normal breath sounds. No wheezing, rhonchi or rales.  Abdominal:     General: Abdomen is flat. Bowel sounds are normal. There is no distension.     Palpations: Abdomen is soft.     Tenderness: There is no abdominal tenderness. There is no guarding.  Musculoskeletal:     Cervical back: Normal range of motion and neck supple.  Skin:    General: Skin is warm and dry.     Capillary Refill: Capillary refill takes less than 2 seconds.  Neurological:     General: No focal deficit present.     Mental Status: He is alert and oriented to person, place, and time.     Cranial Nerves: No cranial nerve deficit.     Motor: No weakness.     Deep Tendon Reflexes: Reflexes normal.  Psychiatric:        Mood and Affect: Mood normal.        Behavior: Behavior normal.        Thought Content: Thought content normal.        Judgment: Judgment normal.  Results for orders placed or performed in visit on 04/23/22  Comp Met (CMET)  Result Value Ref Range   Glucose 100 (H) 70 - 99 mg/dL   BUN 15 6 - 24 mg/dL   Creatinine, Ser 1.61 (H) 0.76 - 1.27 mg/dL   eGFR 50 (L) >59 mL/min/1.73   BUN/Creatinine Ratio 9 9 - 20   Sodium 139 134 - 144 mmol/L   Potassium 3.9 3.5 - 5.2 mmol/L   Chloride 104 96 - 106 mmol/L   CO2 21 20 - 29 mmol/L   Calcium 9.4 8.7 - 10.2 mg/dL   Total Protein 7.8 6.0 - 8.5 g/dL   Albumin 4.6 3.8 - 4.9 g/dL   Globulin, Total 3.2 1.5 - 4.5 g/dL   Albumin/Globulin Ratio 1.4 1.2 - 2.2   Bilirubin Total 0.4 0.0 - 1.2 mg/dL   Alkaline Phosphatase 84 44 - 121 IU/L   AST 10 0 -  40 IU/L   ALT 9 0 - 44 IU/L  Lipid Profile  Result Value Ref Range   Cholesterol, Total 153 100 - 199 mg/dL   Triglycerides 169 (H) 0 - 149 mg/dL   HDL 45 >39 mg/dL   VLDL Cholesterol Cal 29 5 - 40 mg/dL   LDL Chol Calc (NIH) 79 0 - 99 mg/dL   Chol/HDL Ratio 3.4 0.0 - 5.0 ratio  HgB A1c  Result Value Ref Range   Hgb A1c MFr Bld 5.7 (H) 4.8 - 5.6 %   Est. average glucose Bld gHb Est-mCnc 117 mg/dL  VITAMIN D 25 Hydroxy (Vit-D Deficiency, Fractures)  Result Value Ref Range   Vit D, 25-Hydroxy 39.4 30.0 - 100.0 ng/mL      Assessment & Plan:   Problem List Items Addressed This Visit       Cardiovascular and Mediastinum   Essential hypertension    Chronic.  Controlled.  Continue with current medication regimen of Amlodipine '10mg'$  and Losartan '100mg'$  daily.  Labs ordered today.  Return to clinic in 6 months for reevaluation.  Call sooner if concerns arise.          Respiratory   Sleep apnea    Will order updated sleep study.  May need updated settings.       Relevant Orders   Home sleep test     Endocrine   Diabetes mellitus without complication (HCC)    Chronic.  Controlled.  Last A1c 5.7%.  Continue with current medication regimen of Ozempic 0.'25mg'$ .  Labs ordered today.  Microalbumin ordered today.  Return to clinic in 6 months for reevaluation.  Call sooner if concerns arise.        Relevant Orders   HgB A1c   Microalbumin, Urine Waived     Genitourinary   CKD (chronic kidney disease) stage 3, GFR 30-59 ml/min (HCC)    Chronic.  Controlled.  Continue with current medication regimen.  Labs ordered today.  Return to clinic in 6 months for reevaluation.  Call sooner if concerns arise.          Other   Vitamin D deficiency    Labs ordered at visit today.  Will make recommendations based on lab results.        Relevant Orders   Vitamin D (25 hydroxy)   Hypercholesteremia    Chronic. Continue with Crestor '5mg'$  daily.  Labs ordered today.  Follow up in 6 months.   Call sooner if concerns arise.        Relevant Orders   Lipid panel  Other Visit Diagnoses     Annual physical exam    -  Primary   Health maintenance reviewed during visit today.  Labs ordered. Vaccines up to date. Will get shingles at next visit. Colonoscopy due in [place]- ordered today.   Relevant Orders   TSH   PSA   Lipid panel   CBC with Differential/Platelet   Comprehensive metabolic panel   Urinalysis, Routine w reflex microscopic   HgB A1c   Microalbumin, Urine Waived   Screening for colon cancer       Relevant Orders   Ambulatory referral to Gastroenterology        Discussed aspirin prophylaxis for myocardial infarction prevention and decision was it was not indicated  LABORATORY TESTING:  Health maintenance labs ordered today as discussed above.   The natural history of prostate cancer and ongoing controversy regarding screening and potential treatment outcomes of prostate cancer has been discussed with the patient. The meaning of a false positive PSA and a false negative PSA has been discussed. He indicates understanding of the limitations of this screening test and wishes to proceed with screening PSA testing.   IMMUNIZATIONS:   - Tdap: Tetanus vaccination status reviewed: last tetanus booster within 10 years. - Influenza: up to date - Pneumovax: up to date - Prevnar: Not applicable - COVID: Refused - HPV: Not applicable - Shingrix vaccine: will get at next visit.  SCREENING: - Colonoscopy: Up to date  Discussed with patient purpose of the colonoscopy is to detect colon cancer at curable precancerous or early stages   - AAA Screening: Not applicable  -Hearing Test: Not applicable  -Spirometry: Not applicable   PATIENT COUNSELING:    Sexuality: Discussed sexually transmitted diseases, partner selection, use of condoms, avoidance of unintended pregnancy  and contraceptive alternatives.   Advised to avoid cigarette smoking.  I discussed with the  patient that most people either abstain from alcohol or drink within safe limits (<=14/week and <=4 drinks/occasion for males, <=7/weeks and <= 3 drinks/occasion for females) and that the risk for alcohol disorders and other health effects rises proportionally with the number of drinks per week and how often a drinker exceeds daily limits.  Discussed cessation/primary prevention of drug use and availability of treatment for abuse.   Diet: Encouraged to adjust caloric intake to maintain  or achieve ideal body weight, to reduce intake of dietary saturated fat and total fat, to limit sodium intake by avoiding high sodium foods and not adding table salt, and to maintain adequate dietary potassium and calcium preferably from fresh fruits, vegetables, and low-fat dairy products.    stressed the importance of regular exercise  Injury prevention: Discussed safety belts, safety helmets, smoke detector, smoking near bedding or upholstery.   Dental health: Discussed importance of regular tooth brushing, flossing, and dental visits.   Follow up plan: NEXT PREVENTATIVE PHYSICAL DUE IN 1 YEAR. Return in about 5 months (around 03/25/2023) for HTN, HLD, DM2 FU.

## 2022-10-24 NOTE — Assessment & Plan Note (Signed)
Chronic.  Controlled.  Last A1c 5.7%.  Continue with current medication regimen of Ozempic 0.'25mg'$ .  Labs ordered today.  Microalbumin ordered today.  Return to clinic in 6 months for reevaluation.  Call sooner if concerns arise.

## 2022-10-24 NOTE — Assessment & Plan Note (Signed)
Chronic.  Controlled.  Continue with current medication regimen of Amlodipine '10mg'$  and Losartan '100mg'$  daily.  Labs ordered today.  Return to clinic in 6 months for reevaluation.  Call sooner if concerns arise.

## 2022-10-25 ENCOUNTER — Other Ambulatory Visit: Payer: Self-pay

## 2022-10-25 ENCOUNTER — Telehealth: Payer: Self-pay

## 2022-10-25 DIAGNOSIS — Z860100 Personal history of colon polyps, unspecified: Secondary | ICD-10-CM

## 2022-10-25 LAB — LIPID PANEL
Chol/HDL Ratio: 2.3 ratio (ref 0.0–5.0)
Cholesterol, Total: 156 mg/dL (ref 100–199)
HDL: 69 mg/dL (ref >39)
LDL Chol Calc (NIH): 73 mg/dL (ref 0–99)
Triglycerides: 70 mg/dL (ref 0–149)
VLDL Cholesterol Cal: 14 mg/dL (ref 5–40)

## 2022-10-25 LAB — CBC WITH DIFFERENTIAL/PLATELET
Basophils Absolute: 0 10*3/uL (ref 0.0–0.2)
Basos: 1 %
EOS (ABSOLUTE): 0.1 10*3/uL (ref 0.0–0.4)
Eos: 2 %
Hematocrit: 47.3 % (ref 37.5–51.0)
Hemoglobin: 15.5 g/dL (ref 13.0–17.7)
Immature Grans (Abs): 0 10*3/uL (ref 0.0–0.1)
Immature Granulocytes: 0 %
Lymphocytes Absolute: 1.6 10*3/uL (ref 0.7–3.1)
Lymphs: 26 %
MCH: 27.2 pg (ref 26.6–33.0)
MCHC: 32.8 g/dL (ref 31.5–35.7)
MCV: 83 fL (ref 79–97)
Monocytes Absolute: 0.5 10*3/uL (ref 0.1–0.9)
Monocytes: 8 %
Neutrophils Absolute: 3.8 10*3/uL (ref 1.4–7.0)
Neutrophils: 63 %
Platelets: 269 10*3/uL (ref 150–450)
RBC: 5.7 x10E6/uL (ref 4.14–5.80)
RDW: 15.8 % — ABNORMAL HIGH (ref 11.6–15.4)
WBC: 6.1 10*3/uL (ref 3.4–10.8)

## 2022-10-25 LAB — COMPREHENSIVE METABOLIC PANEL
ALT: 20 IU/L (ref 0–44)
AST: 14 IU/L (ref 0–40)
Albumin/Globulin Ratio: 1.4 (ref 1.2–2.2)
Albumin: 4.6 g/dL (ref 3.8–4.9)
Alkaline Phosphatase: 87 IU/L (ref 44–121)
BUN/Creatinine Ratio: 12 (ref 9–20)
BUN: 17 mg/dL (ref 6–24)
Bilirubin Total: 0.6 mg/dL (ref 0.0–1.2)
CO2: 23 mmol/L (ref 20–29)
Calcium: 9.4 mg/dL (ref 8.7–10.2)
Chloride: 103 mmol/L (ref 96–106)
Creatinine, Ser: 1.41 mg/dL — ABNORMAL HIGH (ref 0.76–1.27)
Globulin, Total: 3.3 g/dL (ref 1.5–4.5)
Glucose: 90 mg/dL (ref 70–99)
Potassium: 4.3 mmol/L (ref 3.5–5.2)
Sodium: 140 mmol/L (ref 134–144)
Total Protein: 7.9 g/dL (ref 6.0–8.5)
eGFR: 58 mL/min/{1.73_m2} — ABNORMAL LOW (ref >59)

## 2022-10-25 LAB — PSA: Prostate Specific Ag, Serum: 0.6 ng/mL (ref 0.0–4.0)

## 2022-10-25 LAB — VITAMIN D 25 HYDROXY (VIT D DEFICIENCY, FRACTURES): Vit D, 25-Hydroxy: 30.7 ng/mL (ref 30.0–100.0)

## 2022-10-25 LAB — HEMOGLOBIN A1C
Est. average glucose Bld gHb Est-mCnc: 117 mg/dL
Hgb A1c MFr Bld: 5.7 % — ABNORMAL HIGH (ref 4.8–5.6)

## 2022-10-25 LAB — TSH: TSH: 1.14 u[IU]/mL (ref 0.450–4.500)

## 2022-10-25 MED ORDER — NA SULFATE-K SULFATE-MG SULF 17.5-3.13-1.6 GM/177ML PO SOLN: 1.0000 | Freq: Once | ORAL | 0 refills | Status: AC

## 2022-10-25 NOTE — Telephone Encounter (Signed)
Gastroenterology Pre-Procedure Review  Request Date: 01/28/23 Requesting Physician: Dr. Vicente Males  PATIENT REVIEW QUESTIONS: The patient responded to the following health history questions as indicated:    1. Are you having any GI issues? no 2. Do you have a personal history of Polyps? yes (last colonoscopy 01/26/20 with Dr. Vicente Males polyps noted) 3. Do you have a family history of Colon Cancer or Polyps? yes (mom and sister colon polyps) 4. Diabetes Mellitus? yes (has been advised to stop Trulicity 25/00/37) 5. Joint replacements in the past 12 months?no 6. Major health problems in the past 3 months?no 7. Any artificial heart valves, MVP, or defibrillator?no    MEDICATIONS & ALLERGIES:    Patient reports the following regarding taking any anticoagulation/antiplatelet therapy:   Plavix, Coumadin, Eliquis, Xarelto, Lovenox, Pradaxa, Brilinta, or Effient? no Aspirin? no  Patient confirms/reports the following medications:  Current Outpatient Medications  Medication Sig Dispense Refill   amLODipine (NORVASC) 10 MG tablet TAKE 1 TABLET(10 MG) BY MOUTH DAILY 90 tablet 0   Blood Glucose Monitoring Suppl (BLOOD GLUCOSE MONITOR SYSTEM) w/Device KIT USE AS DIRECTED. 1 kit 0   fluticasone (FLONASE) 50 MCG/ACT nasal spray SPRAY 1 SPRAY IN EACH NOSTRIL ONCE DAILY. 16 g 3   gabapentin (NEURONTIN) 100 MG capsule TAKE ONE CAPSULE BY MOUTH 3 TIMES A DAY 90 capsule 1   glucose blood (RIGHTEST GS550 BLOOD GLUCOSE) test strip USE AS DIRECTED. 100 each PRN   irbesartan (AVAPRO) 300 MG tablet Take 1 tablet (300 mg total) by mouth daily. 30 tablet 0   Rightest GL300 Lancets MISC USE AS DIRECTED. 100 each PRN   rosuvastatin (CRESTOR) 5 MG tablet Take 1 tablet (5 mg total) by mouth daily. 90 tablet 1   Semaglutide,0.25 or 0.'5MG'$ /DOS, (OZEMPIC, 0.25 OR 0.5 MG/DOSE,) 2 MG/3ML SOPN Inject 0.25 mg into the skin once a week. 3 mL 2   No current facility-administered medications for this visit.    Patient  confirms/reports the following allergies:  Allergies  Allergen Reactions   Metformin And Related Diarrhea    No orders of the defined types were placed in this encounter.   AUTHORIZATION INFORMATION Primary Insurance: 1D#: Group #:  Secondary Insurance: 1D#: Group #:  SCHEDULE INFORMATION: Date: 01/28/23 Time: Location: [facility]

## 2022-10-25 NOTE — Progress Notes (Signed)
Hi Anthony Lam. It was nice to see you yesterday.  Your lab work looks good.  Your A1c is well controlled at 5.7.  Your kidney function remains well controlled.  No concerns at this time. Continue with your current medication regimen.  Follow up as discussed.  Please let me know if you have any questions.

## 2022-10-30 ENCOUNTER — Telehealth: Payer: Self-pay | Admitting: Nurse Practitioner

## 2022-10-30 NOTE — Telephone Encounter (Signed)
Copied from [place] [phone]. Topic: General - Other >> Oct 30, 2022 10:31 AM Chapman Fitch wrote: Reason for CRM: Pt just spoke with Cpap supply comp and they advised they havent received RX yet for Cpap/ they are working on his order and asked if [name] can direct call them at [phone]/ They never received the fax and said to call instead / please advise asap today

## 2022-10-30 NOTE — Telephone Encounter (Signed)
Patient called back to get an update on his cpap rx. Please advise patient.

## 2022-10-30 NOTE — Telephone Encounter (Signed)
Tried contacting the number provided by the patient, no answer and VM could not be left. Will try to call again.

## 2022-10-31 NOTE — Telephone Encounter (Signed)
Tried contacting [facility] at the number provided by the patient, no answer and VM could not be left as VM was full.

## 2022-10-31 NOTE — Telephone Encounter (Signed)
Form received via fax from [facility] regarding patient's cpap and supplies. Form placed in providers folder for signature.

## 2022-10-31 NOTE — Telephone Encounter (Signed)
Form signed and faxed to [facility].   Called and notified patient that this was completed for him.

## 2022-11-01 DIAGNOSIS — G4733 Obstructive sleep apnea (adult) (pediatric): Secondary | ICD-10-CM | POA: Diagnosis not present

## 2022-11-05 DIAGNOSIS — S86912D Strain of unspecified muscle(s) and tendon(s) at lower leg level, left leg, subsequent encounter: Secondary | ICD-10-CM | POA: Diagnosis not present

## 2022-11-05 DIAGNOSIS — Z7689 Persons encountering health services in other specified circumstances: Secondary | ICD-10-CM | POA: Diagnosis not present

## 2022-11-15 DIAGNOSIS — Z419 Encounter for procedure for purposes other than remedying health state, unspecified: Secondary | ICD-10-CM | POA: Diagnosis not present

## 2022-11-30 ENCOUNTER — Other Ambulatory Visit: Payer: Self-pay | Admitting: Nurse Practitioner

## 2022-11-30 DIAGNOSIS — F419 Anxiety disorder, unspecified: Secondary | ICD-10-CM

## 2022-11-30 NOTE — Telephone Encounter (Signed)
Medication Refill - Medication: gabapentin (NEURONTIN) 100 MG capsule   Has the patient contacted their pharmacy? Yes.    (Agent: If yes, when and what did the pharmacy advise?) Medication expired, contact PCP   Preferred Pharmacy (with phone number or street name): [id number] - [place], [place]   Has the patient been seen for an appointment in the last year OR does the patient have an upcoming appointment? Yes.    Agent: Please be advised that RX refills may take up to 3 business days. We ask that you follow-up with your pharmacy.   Patient says he would like [name] to reinstate the medication. Patient has 3-4 pills left.

## 2022-12-03 MED ORDER — GABAPENTIN 100 MG PO CAPS: ORAL_CAPSULE | Freq: Three times a day (TID) | ORAL | 1 refills | Status: DC

## 2022-12-03 NOTE — Telephone Encounter (Signed)
Requested medication (s) are due for refill today: Yes  Requested medication (s) are on the active medication list: Yes  Last refill:  11/03/20  Future visit scheduled: Yes  Notes to clinic:  Unable to refill per protocol, last refill by another provider.      Requested Prescriptions  Pending Prescriptions Disp Refills   gabapentin (NEURONTIN) 100 MG capsule 90 capsule 1    Sig: TAKE ONE CAPSULE BY MOUTH 3 TIMES A DAY     Neurology: Anticonvulsants - gabapentin Failed - 12/03/2022  9:19 AM      Failed - Cr in normal range and within 360 days    Creatinine  Date Value Ref Range Status  08/04/2013 1.68 (H) 0.60 - 1.30 mg/dL Final   Creatinine, Ser  Date Value Ref Range Status  10/24/2022 1.41 (H) 0.76 - 1.27 mg/dL Final         Passed - Completed PHQ-2 or PHQ-9 in the last 360 days      Passed - Valid encounter within last 12 months    Recent Outpatient Visits           1 month ago Annual physical exam   [place], NP   4 months ago [place], NP   7 months ago Essential hypertension   [place] Jon Billings, NP   1 year ago Annual physical exam   [place] Jon Billings, NP   1 year ago Stage 3a chronic kidney disease [facility])   [place] Jon Billings, NP       Future Appointments             In 3 months Jon Billings, NP [place]

## 2022-12-03 NOTE — Telephone Encounter (Signed)
Called and spoke to the patient. He states that he only takes the Gabapentin as needed for back pain. States if he does a lot of bending over during the day then he would take one, but not every day.

## 2022-12-03 NOTE — Telephone Encounter (Signed)
Can you verify how often patient is taking the medication and for what reason?

## 2022-12-04 DIAGNOSIS — Z7689 Persons encountering health services in other specified circumstances: Secondary | ICD-10-CM | POA: Diagnosis not present

## 2022-12-06 DIAGNOSIS — M47816 Spondylosis without myelopathy or radiculopathy, lumbar region: Secondary | ICD-10-CM | POA: Diagnosis not present

## 2022-12-06 DIAGNOSIS — M5136 Other intervertebral disc degeneration, lumbar region: Secondary | ICD-10-CM | POA: Diagnosis not present

## 2022-12-06 DIAGNOSIS — M479 Spondylosis, unspecified: Secondary | ICD-10-CM | POA: Diagnosis not present

## 2022-12-06 DIAGNOSIS — Z7689 Persons encountering health services in other specified circumstances: Secondary | ICD-10-CM | POA: Diagnosis not present

## 2022-12-14 DIAGNOSIS — Z419 Encounter for procedure for purposes other than remedying health state, unspecified: Secondary | ICD-10-CM | POA: Diagnosis not present

## 2022-12-19 ENCOUNTER — Ambulatory Visit: Payer: Self-pay

## 2022-12-19 NOTE — Telephone Encounter (Signed)
     Chief Complaint: Heart "flutters" Comes and goes. Last 10 minutes. No chest pain Symptoms: Above Frequency: weeks Pertinent Negatives: Patient denies  Disposition: '[]'$ ED /'[]'$ Urgent Care (no appt availability in office) / '[x]'$ Appointment(In office/virtual)/ '[]'$  [facility]/ '[]'$ Home Care/ '[]'$ Refused Recommended Disposition /'[]'$ [facility]/ '[]'$  Follow-up with PCP Additional Notes: Call 911 for worsening of symptoms  Reason for Disposition  [1] Palpitations AND [2] no improvement after using Care Advice  Answer Assessment - Initial Assessment Questions 1. DESCRIPTION: "Please describe your heart rate or heartbeat that you are having" (e.g., fast/slow, regular/irregular, skipped or extra beats, "palpitations")     Flutters 2. ONSET: "When did it start?" (Minutes, hours or days)      Weeks 3. DURATION: "How long does it last" (e.g., seconds, minutes, hours)     10 minutes 4. PATTERN "Does it come and go, or has it been constant since it started?"  "Does it get worse with exertion?"   "Are you feeling it now?"     Comes and goes 5. TAP: "Using your hand, can you tap out what you are feeling on a chair or table in front of you, so that I can hear?" (Note: not all patients can do this)       No 6. HEART RATE: "Can you tell me your heart rate?" "How many beats in 15 seconds?"  (Note: not all patients can do this)       No 7. RECURRENT SYMPTOM: "Have you ever had this before?" If Yes, ask: "When was the last time?" and "What happened that time?"      Yes 8. CAUSE: "What do you think is causing the palpitations?"     Unsure 9. CARDIAC HISTORY: "Do you have any history of heart disease?" (e.g., heart attack, angina, bypass surgery, angioplasty, arrhythmia)      No 10. OTHER SYMPTOMS: "Do you have any other symptoms?" (e.g., dizziness, chest pain, sweating, difficulty breathing)       SOB 11. PREGNANCY: "Is there any chance you are pregnant?" "When was your last menstrual  period?"       N/a  Protocols used: Heart Rate and Heartbeat Questions-A-AH

## 2022-12-20 ENCOUNTER — Ambulatory Visit: Payer: Medicaid Other | Admitting: Nurse Practitioner

## 2022-12-20 NOTE — Progress Notes (Deleted)
There were no vitals taken for this visit.   Subjective:    Patient ID: Anthony Lam, male    DOB: 1965/10/26, 57 y.o.   MRN: [id number]  HPI: Anthony Lam is a 57 y.o. male  No chief complaint on file.  PALPITATIONS Duration: {Blank single:19197::"days","weeks","months"} Symptom description: Duration of episode: {Blank single:19197::"seconds","minutes","hours","unknown"} Frequency: {Blank single:19197::"no history of the same","recurrentl"} Activity when event occurred: {Blank single:19197::"rest","valsalva","prolonged sitting","exercising"} Related to exertion: {Blank single:19197::"yes","no"} Dyspnea: {Blank single:19197::"yes","no"} Chest pain: {Blank single:19197::"yes","no"} Syncope: {Blank single:19197::"yes","no"} Anxiety/stress: {Blank single:19197::"yes","no"} Nausea/vomiting: {Blank single:19197::"yes","no"} Diaphoresis: {Blank single:19197::"yes","no"} Coronary artery disease: {Blank single:19197::"yes","no"} Congestive heart failure: {Blank single:19197::"yes","no"} Arrhythmia:{Blank single:19197::"yes","no"} Thyroid disease: {Blank single:19197::"yes","no"} Caffeine intake: {Blank single:19197::"none","1 cafienated beverage","1 cafienated beverages","Heavy caffeine consumption"} Status:  {Blank multiple:19196::"better","worse","stable","fluctuating"} Treatments attempted:{Blank single:19197::"none"}  Relevant past medical, surgical, family and social history reviewed and updated as indicated. Interim medical history since our last visit reviewed. Allergies and medications reviewed and updated.  Review of Systems  Per HPI unless specifically indicated above     Objective:    There were no vitals taken for this visit.  Wt Readings from Last 3 Encounters:  10/24/22 270 lb 12.8 oz (122.8 kg)  07/27/22 267 lb 4.8 oz (121.2 kg)  04/23/22 264 lb 9.6 oz (120 kg)    Physical Exam  Results for orders placed or performed in visit on 10/24/22  Microscopic  Examination   Urine  Result Value Ref Range   WBC, UA 0-5 0 - 5 /hpf   RBC, Urine None seen 0 - 2 /hpf   Epithelial Cells (non renal) None seen 0 - 10 /hpf   Mucus, UA Present (A) Not Estab.   Bacteria, UA None seen None seen/Few  TSH  Result Value Ref Range   TSH 1.140 0.450 - 4.500 uIU/mL  PSA  Result Value Ref Range   Prostate Specific Ag, Serum 0.6 0.0 - 4.0 ng/mL  Lipid panel  Result Value Ref Range   Cholesterol, Total 156 100 - 199 mg/dL   Triglycerides 70 0 - 149 mg/dL   HDL 69 >39 mg/dL   VLDL Cholesterol Cal 14 5 - 40 mg/dL   LDL Chol Calc (NIH) 73 0 - 99 mg/dL   Chol/HDL Ratio 2.3 0.0 - 5.0 ratio  CBC with Differential/Platelet  Result Value Ref Range   WBC 6.1 3.4 - 10.8 x10E3/uL   RBC 5.70 4.14 - 5.80 x10E6/uL   Hemoglobin 15.5 13.0 - 17.7 g/dL   Hematocrit 47.3 37.5 - 51.0 %   MCV 83 79 - 97 fL   MCH 27.2 26.6 - 33.0 pg   MCHC 32.8 31.5 - 35.7 g/dL   RDW 15.8 (H) 11.6 - 15.4 %   Platelets 269 150 - 450 x10E3/uL   Neutrophils 63 Not Estab. %   Lymphs 26 Not Estab. %   Monocytes 8 Not Estab. %   Eos 2 Not Estab. %   Basos 1 Not Estab. %   Neutrophils Absolute 3.8 1.4 - 7.0 x10E3/uL   Lymphocytes Absolute 1.6 0.7 - 3.1 x10E3/uL   Monocytes Absolute 0.5 0.1 - 0.9 x10E3/uL   EOS (ABSOLUTE) 0.1 0.0 - 0.4 x10E3/uL   Basophils Absolute 0.0 0.0 - 0.2 x10E3/uL   Immature Granulocytes 0 Not Estab. %   Immature Grans (Abs) 0.0 0.0 - 0.1 x10E3/uL  Comprehensive metabolic panel  Result Value Ref Range   Glucose 90 70 - 99 mg/dL   BUN 17 6 - 24 mg/dL   Creatinine, Ser 1.41 (H) 0.76 - 1.27  mg/dL   eGFR 58 (L) >59 mL/min/1.73   BUN/Creatinine Ratio 12 9 - 20   Sodium 140 134 - 144 mmol/L   Potassium 4.3 3.5 - 5.2 mmol/L   Chloride 103 96 - 106 mmol/L   CO2 23 20 - 29 mmol/L   Calcium 9.4 8.7 - 10.2 mg/dL   Total Protein 7.9 6.0 - 8.5 g/dL   Albumin 4.6 3.8 - 4.9 g/dL   Globulin, Total 3.3 1.5 - 4.5 g/dL   Albumin/Globulin Ratio 1.4 1.2 - 2.2   Bilirubin Total  0.6 0.0 - 1.2 mg/dL   Alkaline Phosphatase 87 44 - 121 IU/L   AST 14 0 - 40 IU/L   ALT 20 0 - 44 IU/L  Urinalysis, Routine w reflex microscopic  Result Value Ref Range   Specific Gravity, UA 1.025 1.005 - 1.030   pH, UA 5.5 5.0 - 7.5   Color, UA Yellow Yellow   Appearance Ur Clear Clear   Leukocytes,UA Negative Negative   Protein,UA 1+ (A) Negative/Trace   Glucose, UA Negative Negative   Ketones, UA Negative Negative   RBC, UA Negative Negative   Bilirubin, UA Negative Negative   Urobilinogen, Ur 0.2 0.2 - 1.0 mg/dL   Nitrite, UA Negative Negative   Microscopic Examination See below:   HgB A1c  Result Value Ref Range   Hgb A1c MFr Bld 5.7 (H) 4.8 - 5.6 %   Est. average glucose Bld gHb Est-mCnc 117 mg/dL  Microalbumin, Urine Waived  Result Value Ref Range   Microalb, Ur Waived 80 (H) 0 - 19 mg/L   Creatinine, Urine Waived 200 10 - 300 mg/dL   Microalb/Creat Ratio 30-300 (H) <30 mg/g  Vitamin D (25 hydroxy)  Result Value Ref Range   Vit D, 25-Hydroxy 30.7 30.0 - 100.0 ng/mL      Assessment & Plan:   Problem List Items Addressed This Visit   None    Follow up plan: No follow-ups on file.

## 2022-12-24 DIAGNOSIS — Z7689 Persons encountering health services in other specified circumstances: Secondary | ICD-10-CM | POA: Diagnosis not present

## 2022-12-24 DIAGNOSIS — Z6841 Body Mass Index (BMI) 40.0 and over, adult: Secondary | ICD-10-CM | POA: Diagnosis not present

## 2022-12-24 DIAGNOSIS — E669 Obesity, unspecified: Secondary | ICD-10-CM | POA: Diagnosis not present

## 2022-12-24 DIAGNOSIS — N1831 Chronic kidney disease, stage 3a: Secondary | ICD-10-CM | POA: Diagnosis not present

## 2022-12-24 DIAGNOSIS — I10 Essential (primary) hypertension: Secondary | ICD-10-CM | POA: Diagnosis not present

## 2022-12-27 ENCOUNTER — Ambulatory Visit (INDEPENDENT_AMBULATORY_CARE_PROVIDER_SITE_OTHER): Payer: Medicaid Other | Admitting: Nurse Practitioner

## 2022-12-27 ENCOUNTER — Encounter: Payer: Self-pay | Admitting: Nurse Practitioner

## 2022-12-27 VITALS — BP 132/88 | HR 80 | Temp 98.7°F | Wt 282.2 lb

## 2022-12-27 DIAGNOSIS — Z7689 Persons encountering health services in other specified circumstances: Secondary | ICD-10-CM | POA: Diagnosis not present

## 2022-12-27 DIAGNOSIS — R002 Palpitations: Secondary | ICD-10-CM

## 2022-12-27 NOTE — Progress Notes (Signed)
BP 132/88   Pulse 80   Temp 98.7 F (37.1 C) (Oral)   Wt 282 lb 3.2 oz (128 kg)   SpO2 97%   BMI 41.67 kg/m    Subjective:    Patient ID: Anthony Lam, male    DOB: 11-21-1965, 57 y.o.   MRN: [id number]  HPI: Anthony Lam is a 57 y.o. male  Chief Complaint  Patient presents with   Palpitations    Pt states he has been experiencing SOB as well as off and on palpitations. States this has been going on for about a month.    PALPITATIONS Duration:  about a month Symptom description: feels a fluttering in his chest Duration of episode:  20 minutes Frequency: no history of the same Activity when event occurred: rest Related to exertion: yes Dyspnea: yes Chest pain: no Syncope: no Anxiety/stress: yes Nausea/vomiting: no Diaphoresis: no Coronary artery disease: no Congestive heart failure: no Arrhythmia:no Thyroid disease: no Caffeine intake:  1-2 every other day Status:  stable Treatments attempted: drinking more water.  Relevant past medical, surgical, family and social history reviewed and updated as indicated. Interim medical history since our last visit reviewed. Allergies and medications reviewed and updated.  Review of Systems  Respiratory:  Positive for shortness of breath. Negative for cough and chest tightness.   Cardiovascular:  Positive for palpitations. Negative for chest pain.    Per HPI unless specifically indicated above     Objective:    BP 132/88   Pulse 80   Temp 98.7 F (37.1 C) (Oral)   Wt 282 lb 3.2 oz (128 kg)   SpO2 97%   BMI 41.67 kg/m   Wt Readings from Last 3 Encounters:  12/27/22 282 lb 3.2 oz (128 kg)  10/24/22 270 lb 12.8 oz (122.8 kg)  07/27/22 267 lb 4.8 oz (121.2 kg)    Physical Exam Vitals and nursing note reviewed.  Constitutional:      General: He is not in acute distress.    Appearance: Normal appearance. He is obese. He is not ill-appearing, toxic-appearing or diaphoretic.  HENT:     Head: Normocephalic.      Right Ear: External ear normal.     Left Ear: External ear normal.     Nose: Nose normal. No congestion or rhinorrhea.     Mouth/Throat:     Mouth: Mucous membranes are moist.  Eyes:     General:        Right eye: No discharge.        Left eye: No discharge.     Extraocular Movements: Extraocular movements intact.     Conjunctiva/sclera: Conjunctivae normal.     Pupils: Pupils are equal, round, and reactive to light.  Cardiovascular:     Rate and Rhythm: Normal rate and regular rhythm.     Heart sounds: No murmur heard. Pulmonary:     Effort: Pulmonary effort is normal. No respiratory distress.     Breath sounds: Normal breath sounds. No wheezing, rhonchi or rales.  Abdominal:     General: Abdomen is flat. Bowel sounds are normal.  Musculoskeletal:     Cervical back: Normal range of motion and neck supple.  Skin:    General: Skin is warm and dry.     Capillary Refill: Capillary refill takes less than 2 seconds.  Neurological:     General: No focal deficit present.     Mental Status: He is alert and oriented to person, place, and time.  Psychiatric:        Mood and Affect: Mood normal.        Behavior: Behavior normal.        Thought Content: Thought content normal.        Judgment: Judgment normal.     Results for orders placed or performed in visit on 10/24/22  Microscopic Examination   Urine  Result Value Ref Range   WBC, UA 0-5 0 - 5 /hpf   RBC, Urine None seen 0 - 2 /hpf   Epithelial Cells (non renal) None seen 0 - 10 /hpf   Mucus, UA Present (A) Not Estab.   Bacteria, UA None seen None seen/Few  TSH  Result Value Ref Range   TSH 1.140 0.450 - 4.500 uIU/mL  PSA  Result Value Ref Range   Prostate Specific Ag, Serum 0.6 0.0 - 4.0 ng/mL  Lipid panel  Result Value Ref Range   Cholesterol, Total 156 100 - 199 mg/dL   Triglycerides 70 0 - 149 mg/dL   HDL 69 >39 mg/dL   VLDL Cholesterol Cal 14 5 - 40 mg/dL   LDL Chol Calc (NIH) 73 0 - 99 mg/dL   Chol/HDL  Ratio 2.3 0.0 - 5.0 ratio  CBC with Differential/Platelet  Result Value Ref Range   WBC 6.1 3.4 - 10.8 x10E3/uL   RBC 5.70 4.14 - 5.80 x10E6/uL   Hemoglobin 15.5 13.0 - 17.7 g/dL   Hematocrit 47.3 37.5 - 51.0 %   MCV 83 79 - 97 fL   MCH 27.2 26.6 - 33.0 pg   MCHC 32.8 31.5 - 35.7 g/dL   RDW 15.8 (H) 11.6 - 15.4 %   Platelets 269 150 - 450 x10E3/uL   Neutrophils 63 Not Estab. %   Lymphs 26 Not Estab. %   Monocytes 8 Not Estab. %   Eos 2 Not Estab. %   Basos 1 Not Estab. %   Neutrophils Absolute 3.8 1.4 - 7.0 x10E3/uL   Lymphocytes Absolute 1.6 0.7 - 3.1 x10E3/uL   Monocytes Absolute 0.5 0.1 - 0.9 x10E3/uL   EOS (ABSOLUTE) 0.1 0.0 - 0.4 x10E3/uL   Basophils Absolute 0.0 0.0 - 0.2 x10E3/uL   Immature Granulocytes 0 Not Estab. %   Immature Grans (Abs) 0.0 0.0 - 0.1 x10E3/uL  Comprehensive metabolic panel  Result Value Ref Range   Glucose 90 70 - 99 mg/dL   BUN 17 6 - 24 mg/dL   Creatinine, Ser 1.41 (H) 0.76 - 1.27 mg/dL   eGFR 58 (L) >59 mL/min/1.73   BUN/Creatinine Ratio 12 9 - 20   Sodium 140 134 - 144 mmol/L   Potassium 4.3 3.5 - 5.2 mmol/L   Chloride 103 96 - 106 mmol/L   CO2 23 20 - 29 mmol/L   Calcium 9.4 8.7 - 10.2 mg/dL   Total Protein 7.9 6.0 - 8.5 g/dL   Albumin 4.6 3.8 - 4.9 g/dL   Globulin, Total 3.3 1.5 - 4.5 g/dL   Albumin/Globulin Ratio 1.4 1.2 - 2.2   Bilirubin Total 0.6 0.0 - 1.2 mg/dL   Alkaline Phosphatase 87 44 - 121 IU/L   AST 14 0 - 40 IU/L   ALT 20 0 - 44 IU/L  Urinalysis, Routine w reflex microscopic  Result Value Ref Range   Specific Gravity, UA 1.025 1.005 - 1.030   pH, UA 5.5 5.0 - 7.5   Color, UA Yellow Yellow   Appearance Ur Clear Clear   Leukocytes,UA Negative Negative   Protein,UA 1+ (A)  Negative/Trace   Glucose, UA Negative Negative   Ketones, UA Negative Negative   RBC, UA Negative Negative   Bilirubin, UA Negative Negative   Urobilinogen, Ur 0.2 0.2 - 1.0 mg/dL   Nitrite, UA Negative Negative   Microscopic Examination See below:    HgB A1c  Result Value Ref Range   Hgb A1c MFr Bld 5.7 (H) 4.8 - 5.6 %   Est. average glucose Bld gHb Est-mCnc 117 mg/dL  Microalbumin, Urine Waived  Result Value Ref Range   Microalb, Ur Waived 80 (H) 0 - 19 mg/L   Creatinine, Urine Waived 200 10 - 300 mg/dL   Microalb/Creat Ratio 30-300 (H) <30 mg/g  Vitamin D (25 hydroxy)  Result Value Ref Range   Vit D, 25-Hydroxy 30.7 30.0 - 100.0 ng/mL      Assessment & Plan:   Problem List Items Addressed This Visit   None Visit Diagnoses     Palpitations    -  Primary   NSR on EKG.  Possibly related to caffeine intake. Recommend keeping a log.  Referral placed for Cardiology for further evaluation and management.   Relevant Orders   EKG 12-Lead (Completed)   Ambulatory referral to Cardiology        Follow up plan: Return if symptoms worsen or fail to improve.

## 2022-12-27 NOTE — Progress Notes (Signed)
Results discussed with patient during visit.

## 2023-01-10 DIAGNOSIS — E113393 Type 2 diabetes mellitus with moderate nonproliferative diabetic retinopathy without macular edema, bilateral: Secondary | ICD-10-CM | POA: Diagnosis not present

## 2023-01-14 ENCOUNTER — Other Ambulatory Visit: Payer: Self-pay | Admitting: Nurse Practitioner

## 2023-01-14 DIAGNOSIS — I10 Essential (primary) hypertension: Secondary | ICD-10-CM

## 2023-01-14 DIAGNOSIS — Z419 Encounter for procedure for purposes other than remedying health state, unspecified: Secondary | ICD-10-CM | POA: Diagnosis not present

## 2023-01-14 NOTE — Telephone Encounter (Signed)
Requested Prescriptions  Pending Prescriptions Disp Refills   rosuvastatin (CRESTOR) 5 MG tablet [Pharmacy Med Name: ROSUVASTATIN 5MG  TABLETS] 90 tablet 2    Sig: TAKE 1 TABLET(5 MG) BY MOUTH DAILY     Cardiovascular:  Antilipid - Statins 2 Failed - 01/14/2023  6:20 AM      Failed - Cr in normal range and within 360 days    Creatinine  Date Value Ref Range Status  08/04/2013 1.68 (H) 0.60 - 1.30 mg/dL Final   Creatinine, Ser  Date Value Ref Range Status  10/24/2022 1.41 (H) 0.76 - 1.27 mg/dL Final         Failed - Lipid Panel in normal range within the last 12 months    Cholesterol, Total  Date Value Ref Range Status  10/24/2022 156 100 - 199 mg/dL Final   LDL Chol Calc (NIH)  Date Value Ref Range Status  10/24/2022 73 0 - 99 mg/dL Final   HDL  Date Value Ref Range Status  10/24/2022 69 >39 mg/dL Final   Triglycerides  Date Value Ref Range Status  10/24/2022 70 0 - 149 mg/dL Final         Passed - Patient is not pregnant      Passed - Valid encounter within last 12 months    Recent Outpatient Visits           2 weeks ago [place], NP   2 months ago Annual physical exam   [place] Jon Billings, NP   5 months ago [place], NP   8 months ago Essential hypertension   [place] Jon Billings, NP   1 year ago Annual physical exam   [place] Jon Billings, NP       Future Appointments             In 1 month Agbor-Etang, Aaron Edelman, MD [place] at [place]   In 2 months Jon Billings, NP [place], [facility]            Refused Prescriptions Disp Refills   amLODipine ([place]) 10 MG tablet [Pharmacy Med Name: AMLODIPINE BESYLATE 10MG  TABLETS] 90 tablet 0    Sig: TAKE 1 TABLET(10 MG) BY MOUTH DAILY      Cardiovascular: Calcium Channel Blockers 2 Passed - 01/14/2023  6:20 AM      Passed - Last BP in normal range    BP Readings from Last 1 Encounters:  12/27/22 132/88         Passed - Last Heart Rate in normal range    Pulse Readings from Last 1 Encounters:  12/27/22 80         Passed - Valid encounter within last 6 months    Recent Outpatient Visits           2 weeks ago [place], NP   2 months ago Annual physical exam   [place], NP   5 months ago [place], NP   8 months ago Essential hypertension   [place] Jon Billings, NP   1 year ago Annual physical exam   [place] Jon Billings, NP       Future Appointments  In 1 month Agbor-Etang, Aaron Edelman, MD [place] at [place]   In 2 months Jon Billings, NP [place], [place]

## 2023-01-16 ENCOUNTER — Other Ambulatory Visit: Payer: Self-pay | Admitting: Nurse Practitioner

## 2023-01-16 DIAGNOSIS — R7303 Prediabetes: Secondary | ICD-10-CM

## 2023-01-16 NOTE — Telephone Encounter (Signed)
Unable to refill per protocol, Rx expired. Discontinued 03/09/22.  Requested Prescriptions  Pending Prescriptions Disp Refills   OZEMPIC, 0.25 OR 0.5 MG/DOSE, 2 MG/1.5ML SOPN [Pharmacy Med Name: OZEMPIC 0.25 OR 0.5MG /DOSE PN 1.5ML] 6 mL 0    Sig: INJECT 0.25 MG INTO THE SKIN ONCE A WEEK     Endocrinology:  Diabetes - GLP-1 Receptor Agonists - semaglutide Failed - 01/16/2023 12:18 PM      Failed - HBA1C in normal range and within 180 days    Hemoglobin A1C  Date Value Ref Range Status  05/11/2015 6.1  Final   Hgb A1c MFr Bld  Date Value Ref Range Status  10/24/2022 5.7 (H) 4.8 - 5.6 % Final    Comment:             Prediabetes: 5.7 - 6.4          Diabetes: >6.4          Glycemic control for adults with diabetes: <7.0          Failed - Cr in normal range and within 360 days    Creatinine  Date Value Ref Range Status  08/04/2013 1.68 (H) 0.60 - 1.30 mg/dL Final   Creatinine, Ser  Date Value Ref Range Status  10/24/2022 1.41 (H) 0.76 - 1.27 mg/dL Final         Passed - Valid encounter within last 6 months    Recent Outpatient Visits           2 weeks ago [place], NP   2 months ago Annual physical exam   [place], NP   5 months ago [place], NP   8 months ago Essential hypertension   [place] Jon Billings, NP   1 year ago Annual physical exam   [place] Jon Billings, NP       Future Appointments             In 1 month Agbor-Etang, Aaron Edelman, MD [place] at [place]   In 2 months Jon Billings, NP [place], [employer]

## 2023-01-16 NOTE — Telephone Encounter (Signed)
Requested medication (s) are due for refill today: yes  Requested medication (s) are on the active medication list: yes    Last refill:08/03/22  6ml  2 refills  Future visit scheduled yes 03/25/23  Notes to clinic: Pt states pharmacy told denied. Please review, thank you.  Requested Prescriptions  Pending Prescriptions Disp Refills   Semaglutide,0.25 or 0.5MG /DOS, (OZEMPIC, 0.25 OR 0.5 MG/DOSE,) 2 MG/3ML SOPN 3 mL 2    Sig: Inject 0.25 mg into the skin once a week.     Endocrinology:  Diabetes - GLP-1 Receptor Agonists - semaglutide Failed - 01/16/2023 12:52 PM      Failed - HBA1C in normal range and within 180 days    Hemoglobin A1C  Date Value Ref Range Status  05/11/2015 6.1  Final   Hgb A1c MFr Bld  Date Value Ref Range Status  10/24/2022 5.7 (H) 4.8 - 5.6 % Final    Comment:             Prediabetes: 5.7 - 6.4          Diabetes: >6.4          Glycemic control for adults with diabetes: <7.0          Failed - Cr in normal range and within 360 days    Creatinine  Date Value Ref Range Status  08/04/2013 1.68 (H) 0.60 - 1.30 mg/dL Final   Creatinine, Ser  Date Value Ref Range Status  10/24/2022 1.41 (H) 0.76 - 1.27 mg/dL Final         Passed - Valid encounter within last 6 months    Recent Outpatient Visits           2 weeks ago [place], NP   2 months ago Annual physical exam   [place], NP   5 months ago [place], NP   8 months ago Essential hypertension   [place] Jon Billings, NP   1 year ago Annual physical exam   [place] Jon Billings, NP       Future Appointments             In 1 month Agbor-Etang, Aaron Edelman, MD [place] at [place]   In 2 months Jon Billings, NP [place]

## 2023-01-16 NOTE — Telephone Encounter (Signed)
Medication Refill - Medication: Semaglutide,0.25 or 0.5MG /DOS, (OZEMPIC, 0.25 OR 0.5 MG/DOSE,) 2 MG/3ML SOPN   Has the patient contacted their pharmacy? Yes.   Pt stated he received a message that the Rx was denied  Preferred Pharmacy (with phone number or street name):  [facility] [place], [place] Phone: [phone]  Fax: [phone]     Has the patient been seen for an appointment in the last year OR does the patient have an upcoming appointment? Yes.    Agent: Please be advised that RX refills may take up to 3 business days. We ask that you follow-up with your pharmacy.

## 2023-01-17 MED ORDER — OZEMPIC (0.25 OR 0.5 MG/DOSE) 2 MG/3ML ~~LOC~~ SOPN: 0.2500 mg | PEN_INJECTOR | SUBCUTANEOUS | 2 refills | Status: DC

## 2023-01-21 ENCOUNTER — Encounter: Payer: Self-pay | Admitting: Gastroenterology

## 2023-01-23 ENCOUNTER — Other Ambulatory Visit: Payer: Self-pay | Admitting: Nurse Practitioner

## 2023-01-23 DIAGNOSIS — I10 Essential (primary) hypertension: Secondary | ICD-10-CM

## 2023-01-23 NOTE — Telephone Encounter (Signed)
Pt called to report that he has three left but he wants to pick up this medication tomorrow along with his other prescriptions. Advised that PCP has up to 3 days refill.   Pt is requesting a call back to notify him when this has been sent in. Please advise   Best contact: [phone]

## 2023-01-24 NOTE — Telephone Encounter (Signed)
Requested Prescriptions  Pending Prescriptions Disp Refills   amLODipine (NORVASC) 10 MG tablet [Pharmacy Med Name: AMLODIPINE BESYLATE 10MG  TABLETS] 90 tablet 0    Sig: TAKE 1 TABLET BY MOUTH EVERY DAY     Cardiovascular: Calcium Channel Blockers 2 Passed - 01/23/2023  1:09 PM      Passed - Last BP in normal range    BP Readings from Last 1 Encounters:  12/27/22 132/88         Passed - Last Heart Rate in normal range    Pulse Readings from Last 1 Encounters:  12/27/22 80         Passed - Valid encounter within last 6 months    Recent Outpatient Visits           4 weeks ago Palpitations   [facility] Larae Grooms, NP   3 months ago Annual physical exam   [place] [facility] Larae Grooms, NP   6 months ago Hypercholesteremia   [place] [facility] Larae Grooms, NP   9 months ago Essential hypertension   [place] [facility] Larae Grooms, NP   1 year ago Annual physical exam   [place] [facility] Larae Grooms, NP       Future Appointments             In 1 month Agbor-Etang, Arlys John, MD [facility] at [place]   In 2 months Larae Grooms, NP [place] [facility], [facility]

## 2023-01-25 ENCOUNTER — Encounter: Payer: Self-pay | Admitting: Gastroenterology

## 2023-01-28 ENCOUNTER — Ambulatory Visit: Payer: Medicaid Other | Admitting: Certified Registered"

## 2023-01-28 ENCOUNTER — Ambulatory Visit
Admission: RE | Admit: 2023-01-28 | Discharge: 2023-01-28 | Disposition: A | Discharge status: Home / Self Care | Payer: Medicaid Other | Attending: Gastroenterology | Admitting: Gastroenterology

## 2023-01-28 ENCOUNTER — Encounter
Admission: RE | Disposition: A | Discharge status: Home / Self Care | Payer: Self-pay | Source: Home / Self Care | Attending: Gastroenterology

## 2023-01-28 ENCOUNTER — Other Ambulatory Visit: Payer: Self-pay

## 2023-01-28 ENCOUNTER — Encounter: Payer: Self-pay | Admitting: Gastroenterology

## 2023-01-28 DIAGNOSIS — Z8249 Family history of ischemic heart disease and other diseases of the circulatory system: Secondary | ICD-10-CM | POA: Insufficient documentation

## 2023-01-28 DIAGNOSIS — Z1211 Encounter for screening for malignant neoplasm of colon: Secondary | ICD-10-CM | POA: Diagnosis not present

## 2023-01-28 DIAGNOSIS — E1122 Type 2 diabetes mellitus with diabetic chronic kidney disease: Secondary | ICD-10-CM | POA: Diagnosis not present

## 2023-01-28 DIAGNOSIS — K5730 Diverticulosis of large intestine without perforation or abscess without bleeding: Secondary | ICD-10-CM | POA: Insufficient documentation

## 2023-01-28 DIAGNOSIS — F17210 Nicotine dependence, cigarettes, uncomplicated: Secondary | ICD-10-CM | POA: Insufficient documentation

## 2023-01-28 DIAGNOSIS — N183 Chronic kidney disease, stage 3 unspecified: Secondary | ICD-10-CM | POA: Diagnosis not present

## 2023-01-28 DIAGNOSIS — G4730 Sleep apnea, unspecified: Secondary | ICD-10-CM | POA: Insufficient documentation

## 2023-01-28 DIAGNOSIS — Z860100 Personal history of colon polyps, unspecified: Secondary | ICD-10-CM

## 2023-01-28 DIAGNOSIS — Z6841 Body Mass Index (BMI) 40.0 and over, adult: Secondary | ICD-10-CM | POA: Diagnosis not present

## 2023-01-28 DIAGNOSIS — Z7985 Long-term (current) use of injectable non-insulin antidiabetic drugs: Secondary | ICD-10-CM | POA: Insufficient documentation

## 2023-01-28 DIAGNOSIS — I129 Hypertensive chronic kidney disease with stage 1 through stage 4 chronic kidney disease, or unspecified chronic kidney disease: Secondary | ICD-10-CM | POA: Diagnosis not present

## 2023-01-28 DIAGNOSIS — Z5986 Financial insecurity: Secondary | ICD-10-CM | POA: Insufficient documentation

## 2023-01-28 DIAGNOSIS — N1831 Chronic kidney disease, stage 3a: Secondary | ICD-10-CM | POA: Diagnosis not present

## 2023-01-28 HISTORY — PX: COLONOSCOPY WITH PROPOFOL: SHX5780

## 2023-01-28 LAB — GLUCOSE, CAPILLARY: Glucose-Capillary: 105 mg/dL — ABNORMAL HIGH (ref 70–99)

## 2023-01-28 SURGERY — COLONOSCOPY WITH PROPOFOL
Anesthesia: General

## 2023-01-28 MED ORDER — SODIUM CHLORIDE 0.9 % IV SOLN: INTRAVENOUS | Status: DC

## 2023-01-28 MED ORDER — LIDOCAINE HCL (CARDIAC) PF 100 MG/5ML IV SOSY
PREFILLED_SYRINGE | INTRAVENOUS | Status: DC | PRN
  Administered 2023-01-28: 100 mg via INTRAVENOUS

## 2023-01-28 MED ORDER — DEXMEDETOMIDINE HCL 200 MCG/2ML IV SOLN
INTRAVENOUS | Status: DC | PRN
  Administered 2023-01-28: 8 ug via INTRAVENOUS

## 2023-01-28 MED ORDER — PROPOFOL 10 MG/ML IV BOLUS
INTRAVENOUS | Status: DC | PRN
  Administered 2023-01-28: 150 ug/kg/min via INTRAVENOUS
  Administered 2023-01-28: 50 mg via INTRAVENOUS

## 2023-01-28 NOTE — Op Note (Signed)
[facility] Gastroenterology Patient Name: Anthony Lam Procedure Date: 01/28/2023 8:47 AM MRN: [id number] Account #: [id number] Date of Birth: 1965/11/18 Admit Type: Outpatient Age: 57 Room: [facility] ENDO ROOM 1 Gender: Male Note Status: Finalized Instrument Name: Prentice Docker [id number] Procedure:             Colonoscopy Indications:           Surveillance: Personal history of adenomatous polyps                         on last colonoscopy > 3 years ago, Last colonoscopy:                         April 2021 Providers:             Wyline Mood MD, MD Referring MD:          Larae Grooms (Referring MD) Medicines:             Monitored Anesthesia Care Complications:         No immediate complications. Procedure:             Pre-Anesthesia Assessment:                        - Prior to the procedure, a History and Physical was                         performed, and patient medications, allergies and                         sensitivities were reviewed. The patient's tolerance                         of previous anesthesia was reviewed.                        - The risks and benefits of the procedure and the                         sedation options and risks were discussed with the                         patient. All questions were answered and informed                         consent was obtained.                        - ASA Grade Assessment: II - A patient with mild                         systemic disease.                        After obtaining informed consent, the colonoscope was                         passed under direct vision. Throughout the procedure,                         the patient's blood pressure,  pulse, and oxygen                         saturations were monitored continuously. The                         Colonoscope was introduced through the anus and                         advanced to the the cecum, identified by the                         appendiceal  orifice. The colonoscopy was performed                         with ease. The patient tolerated the procedure well.                         The quality of the bowel preparation was good. The                         ileocecal valve, appendiceal orifice, and rectum were                         photographed. Findings:      The perianal and digital rectal examinations were normal.      Scattered medium-mouthed diverticula were found in the sigmoid colon.      The exam was otherwise without abnormality on direct and retroflexion       views. Impression:            - Diverticulosis in the sigmoid colon.                        - The examination was otherwise normal on direct and                         retroflexion views.                        - No specimens collected. Recommendation:        - Discharge patient to home (with escort).                        - Resume previous diet.                        - Continue present medications.                        - Repeat colonoscopy in 10 years for surveillance. Procedure Code(s):     --- Professional ---                        [id number], Colonoscopy, flexible; diagnostic, including                         collection of specimen(s) by brushing or washing, when                         performed (separate procedure) Diagnosis Code(s):     ---  Professional ---                        Z86.010, Personal history of colonic polyps                        K57.30, Diverticulosis of large intestine without                         perforation or abscess without bleeding CPT copyright 2022 American Medical Association. All rights reserved. The codes documented in this report are preliminary and upon coder review may  be revised to meet current compliance requirements. Wyline Mood, MD Wyline Mood MD, MD 01/28/2023 9:21:47 AM This report has been signed electronically. Number of Addenda: 0 Note Initiated On: 01/28/2023 8:47 AM Scope Withdrawal Time: 0 hours 13  minutes 5 seconds  Total Procedure Duration: 0 hours 15 minutes 16 seconds  Estimated Blood Loss:  Estimated blood loss: none.      [facility]

## 2023-01-28 NOTE — H&P (Signed)
Wyline Mood, MD [address], [place], [place], [address] [address], [address], [place], [place], [address] Phone: [phone]  Fax: [phone]  Primary Care Physician:  Larae Grooms, NP   Pre-Procedure History & Physical: HPI:  Anthony Lam is a 58 y.o. male is here for an colonoscopy.   Past Medical History:  Diagnosis Date   Chronic kidney disease    stage 3a   Diabetes mellitus without complication    Hypertension    Migraines    Seizures    Sleep apnea    TB (tuberculosis), treated 2009    Past Surgical History:  Procedure Laterality Date   APPENDECTOMY     COLONOSCOPY WITH PROPOFOL N/A 01/26/2020   Procedure: COLONOSCOPY WITH PROPOFOL;  Surgeon: Wyline Mood, MD;  Location: [facility] ENDOSCOPY;  Service: Gastroenterology;  Laterality: N/A;    Prior to Admission medications   Medication Sig Start Date End Date Taking? Authorizing Provider  amLODipine (NORVASC) 10 MG tablet TAKE 1 TABLET BY MOUTH EVERY DAY 01/24/23   Larae Grooms, NP  Blood Glucose Monitoring Suppl (BLOOD GLUCOSE MONITOR SYSTEM) w/Device KIT USE AS DIRECTED. 06/08/21   Tukov-Yual, Magdalene S, NP  fluticasone (FLONASE) 50 MCG/ACT nasal spray SPRAY 1 SPRAY IN EACH NOSTRIL ONCE DAILY. 01/26/21 01/26/22    gabapentin (NEURONTIN) 100 MG capsule TAKE ONE CAPSULE BY MOUTH 3 TIMES A DAY 12/03/22 12/03/23  Larae Grooms, NP  glucose blood (RIGHTEST [phone] BLOOD GLUCOSE) test strip USE AS DIRECTED. 06/08/21   Beryl Meager K, RPH  irbesartan (AVAPRO) 300 MG tablet Take 1 tablet (300 mg total) by mouth daily. 07/27/22   Larae Grooms, NP  Rightest [phone] Lancets MISC USE AS DIRECTED. 06/08/21   Beryl Meager K, RPH  rosuvastatin (CRESTOR) 5 MG tablet TAKE 1 TABLET(5 MG) BY MOUTH DAILY Patient not taking: Reported on 01/21/2023 01/14/23   Larae Grooms, NP  Semaglutide,0.25 or 0.5MG /DOS, (OZEMPIC, 0.25 OR 0.5 MG/DOSE,) 2 MG/3ML SOPN Inject 0.25 mg into the skin once a week. 01/17/23    Larae Grooms, NP    Allergies as of 10/25/2022 - Review Complete 10/25/2022  Allergen Reaction Noted   Metformin and related Diarrhea 04/12/2021    Family History  Problem Relation Age of Onset   Hypertension Mother    Constipation Father        chronic   Alzheimer's disease Father    Deep vein thrombosis Sister     Social History   Socioeconomic History   Marital status: Single    Spouse name: Not on file   Number of children: 2   Years of education: Not on file   Highest education level: 12th grade  Occupational History   Not on file  Tobacco Use   Smoking status: Some Days    Years: 15    Types: Cigarettes   Smokeless tobacco: Never   Tobacco comments:    The only time he smokes is when he has a beer 1 cigarette. 1 pack of cigarettes lasts ~2 weeks.  Vaping Use   Vaping Use: Never used  Substance and Sexual Activity   Alcohol use: Yes    Alcohol/week: 1.0 standard drink of alcohol    Types: 1 Cans of beer per week    Comment: 1-2 beers a week   Drug use: Never   Sexual activity: Yes  Other Topics Concern   Not on file  Social History Narrative   Not on file   Social Determinants of Health   Financial Resource  Strain: High Risk (07/10/2018)   Overall Financial Resource Strain (CARDIA)    Difficulty of Paying Living Expenses: Very hard  Food Insecurity: No Food Insecurity (04/12/2021)   Hunger Vital Sign    Worried About Running Out of Food in the Last Year: Never true    Ran Out of Food in the Last Year: Never true  Transportation Needs: No Transportation Needs (04/12/2021)   PRAPARE - Administrator, Civil Service (Medical): No    Lack of Transportation (Non-Medical): No  Physical Activity: Not on file  Stress: Not on file  Social Connections: Not on file  Intimate Partner Violence: Not on file    Review of Systems: See HPI, otherwise negative ROS  Physical Exam: There were no vitals taken for this visit. General:   Alert,   pleasant and cooperative in NAD Head:  Normocephalic and atraumatic. Neck:  Supple; no masses or thyromegaly. Lungs:  Clear throughout to auscultation, normal respiratory effort.    Heart:  +S1, +S2, Regular rate and rhythm, No edema. Abdomen:  Soft, nontender and nondistended. Normal bowel sounds, without guarding, and without rebound.   Neurologic:  Alert and  oriented x4;  grossly normal neurologically.  Impression/Plan: Anthony Lam is here for an colonoscopy to be performed for surveillance due to prior history of colon polyps   Risks, benefits, limitations, and alternatives regarding  colonoscopy have been reviewed with the patient.  Questions have been answered.  All parties agreeable.   Wyline Mood, MD  01/28/2023, 8:27 AM

## 2023-01-28 NOTE — Anesthesia Preprocedure Evaluation (Signed)
Anesthesia Evaluation  Patient identified by MRN, date of birth, ID band Patient awake    Reviewed: Allergy & Precautions, NPO status , Patient's Chart, lab work & pertinent test results  History of Anesthesia Complications Negative for: history of anesthetic complications  Airway Mallampati: III  TM Distance: >3 FB Neck ROM: full    Dental no notable dental hx.    Pulmonary sleep apnea and Continuous Positive Airway Pressure Ventilation , Current Smoker and Patient abstained from smoking.   Pulmonary exam normal        Cardiovascular hypertension, On Medications negative cardio ROS Normal cardiovascular exam     Neuro/Psych  Headaches, Seizures -, Well Controlled,  PSYCHIATRIC DISORDERS Anxiety        GI/Hepatic negative GI ROS, Neg liver ROS,,,  Endo/Other  diabetes  Morbid obesity  Renal/GU Renal disease  negative genitourinary   Musculoskeletal   Abdominal   Peds  Hematology negative hematology ROS (+)   Anesthesia Other Findings Past Medical History: No date: Chronic kidney disease     Comment:  stage 3a No date: Diabetes mellitus without complication No date: Hypertension No date: Migraines No date: Seizures No date: Sleep apnea 2009: TB (tuberculosis), treated  Past Surgical History: No date: APPENDECTOMY 01/26/2020: COLONOSCOPY WITH PROPOFOL; N/A     Comment:  Procedure: COLONOSCOPY WITH PROPOFOL;  Surgeon: Wyline Mood, MD;  Location: [facility] ENDOSCOPY;  Service:               Gastroenterology;  Laterality: N/A;  BMI    Body Mass Index: 41.94 kg/m      Reproductive/Obstetrics negative OB ROS                             Anesthesia Physical Anesthesia Plan  ASA: 3  Anesthesia Plan: General   Post-op Pain Management: Minimal or no pain anticipated   Induction: Intravenous  PONV Risk Score and Plan: Propofol infusion and TIVA  Airway Management  Planned: Natural Airway and Nasal Cannula  Additional Equipment:   Intra-op Plan:   Post-operative Plan:   Informed Consent: I have reviewed the patients History and Physical, chart, labs and discussed the procedure including the risks, benefits and alternatives for the proposed anesthesia with the patient or authorized representative who has indicated his/her understanding and acceptance.     Dental Advisory Given  Plan Discussed with: Anesthesiologist, CRNA and Surgeon  Anesthesia Plan Comments: (Patient consented for risks of anesthesia including but not limited to:  - adverse reactions to medications - risk of airway placement if required - damage to eyes, teeth, lips or other oral mucosa - nerve damage due to positioning  - sore throat or hoarseness - Damage to heart, brain, nerves, lungs, other parts of body or loss of life  Patient voiced understanding.)       Anesthesia Quick Evaluation

## 2023-01-28 NOTE — Transfer of Care (Signed)
Immediate Anesthesia Transfer of Care Note  Patient: Anthony Lam  Procedure(s) Performed: COLONOSCOPY WITH PROPOFOL  Patient Location: PACU  Anesthesia Type:General  Level of Consciousness: drowsy  Airway & Oxygen Therapy: Patient Spontanous Breathing and Patient connected to nasal cannula oxygen  Post-op Assessment: Report given to RN and Post -op Vital signs reviewed and stable  Post vital signs: stable  Last Vitals:  Vitals Value Taken Time  BP 106/71 01/28/23 0924  Temp    Pulse 73 01/28/23 0925  Resp 31 01/28/23 0925  SpO2 92 % 01/28/23 0925  Vitals shown include unvalidated device data.  Last Pain:  Vitals:   01/28/23 0844  TempSrc: Temporal  PainSc: 0-No pain         Complications: No notable events documented.

## 2023-01-28 NOTE — Anesthesia Postprocedure Evaluation (Signed)
Anesthesia Post Note  Patient: TRAVONTE DUGGAL  Procedure(s) Performed: COLONOSCOPY WITH PROPOFOL  Patient location during evaluation: Endoscopy Anesthesia Type: General Level of consciousness: awake and alert Pain management: pain level controlled Vital Signs Assessment: post-procedure vital signs reviewed and stable Respiratory status: spontaneous breathing, nonlabored ventilation, respiratory function stable and patient connected to nasal cannula oxygen Cardiovascular status: blood pressure returned to baseline and stable Postop Assessment: no apparent nausea or vomiting Anesthetic complications: no   There were no known notable events for this encounter.   Last Vitals:  Vitals:   01/28/23 0924 01/28/23 0944  BP: 104/71 (!) 129/96  Pulse:    Resp: (!) 32   Temp: 36.4 C   SpO2: 95%     Last Pain:  Vitals:   01/28/23 0957  TempSrc:   PainSc: 0-No pain                 Louie Boston

## 2023-01-29 ENCOUNTER — Encounter: Payer: Self-pay | Admitting: Gastroenterology

## 2023-02-05 DIAGNOSIS — G4733 Obstructive sleep apnea (adult) (pediatric): Secondary | ICD-10-CM | POA: Diagnosis not present

## 2023-02-07 DIAGNOSIS — M479 Spondylosis, unspecified: Secondary | ICD-10-CM | POA: Diagnosis not present

## 2023-02-13 DIAGNOSIS — Z419 Encounter for procedure for purposes other than remedying health state, unspecified: Secondary | ICD-10-CM | POA: Diagnosis not present

## 2023-02-18 DIAGNOSIS — M479 Spondylosis, unspecified: Secondary | ICD-10-CM | POA: Diagnosis not present

## 2023-02-18 DIAGNOSIS — Z7689 Persons encountering health services in other specified circumstances: Secondary | ICD-10-CM | POA: Diagnosis not present

## 2023-02-18 DIAGNOSIS — M4807 Spinal stenosis, lumbosacral region: Secondary | ICD-10-CM | POA: Diagnosis not present

## 2023-02-18 DIAGNOSIS — M47817 Spondylosis without myelopathy or radiculopathy, lumbosacral region: Secondary | ICD-10-CM | POA: Diagnosis not present

## 2023-02-18 DIAGNOSIS — M47816 Spondylosis without myelopathy or radiculopathy, lumbar region: Secondary | ICD-10-CM | POA: Diagnosis not present

## 2023-02-18 DIAGNOSIS — M5136 Other intervertebral disc degeneration, lumbar region: Secondary | ICD-10-CM | POA: Diagnosis not present

## 2023-02-20 ENCOUNTER — Encounter: Payer: Self-pay | Admitting: Gastroenterology

## 2023-02-25 ENCOUNTER — Ambulatory Visit: Payer: Medicaid Other | Attending: Cardiology | Admitting: Cardiology

## 2023-02-25 ENCOUNTER — Ambulatory Visit (INDEPENDENT_AMBULATORY_CARE_PROVIDER_SITE_OTHER): Payer: Medicaid Other

## 2023-02-25 ENCOUNTER — Encounter: Payer: Self-pay | Admitting: Cardiology

## 2023-02-25 VITALS — BP 150/100 | HR 79 | Ht 69.0 in | Wt 289.0 lb

## 2023-02-25 DIAGNOSIS — R002 Palpitations: Secondary | ICD-10-CM | POA: Diagnosis not present

## 2023-02-25 DIAGNOSIS — I10 Essential (primary) hypertension: Secondary | ICD-10-CM

## 2023-02-25 DIAGNOSIS — Z7689 Persons encountering health services in other specified circumstances: Secondary | ICD-10-CM | POA: Diagnosis not present

## 2023-02-25 NOTE — Progress Notes (Signed)
Cardiology Office Note:    Date:  02/25/2023   ID:  [name], DOB 1966-07-08, MRN [id number]  PCP:  Larae Grooms, NP    [facility]Care Providers Cardiologist:  Debbe Odea, MD     Referring MD: Larae Grooms, NP   Chief Complaint  Patient presents with   Follow-up   New Patient (Initial Visit)    Palpitations C/o heart flutters with Crestor and pt mentioned he d/c due to heart flutters. Meds reviewed verbally with pt.    History of Present Illness:    Anthony Lam is a 57 y.o. male with a hx of hypertension, CKD 3, smoker who presented due to palpitations.  Complains of palpitations and skipped heartbeats starting 3 months ago.  Symptoms occur about 2 times weekly lasting a few minutes.  Symptoms began a week after starting Crestor.  Was started on Crestor due to history of prediabetes, last cholesterol was normal.  He stopped taking Crestor about 6 weeks ago, symptoms have persisted.  Denies dizziness, chest pain or syncope.  Trying to exercise more often.  Blood pressures at home usually controlled with systolics in the 130s.  Past Medical History:  Diagnosis Date   Chronic kidney disease    stage 3a   Diabetes mellitus without complication (HCC)    Hypertension    Migraines    Seizures (HCC)    Sleep apnea    TB (tuberculosis), treated 2009    Past Surgical History:  Procedure Laterality Date   APPENDECTOMY     COLONOSCOPY WITH PROPOFOL N/A 01/26/2020   Procedure: COLONOSCOPY WITH PROPOFOL;  Surgeon: Wyline Mood, MD;  Location: [facility] ENDOSCOPY;  Service: Gastroenterology;  Laterality: N/A;   COLONOSCOPY WITH PROPOFOL N/A 01/28/2023   Procedure: COLONOSCOPY WITH PROPOFOL;  Surgeon: Wyline Mood, MD;  Location: [facility] ENDOSCOPY;  Service: Gastroenterology;  Laterality: N/A;  Patient needs a late morning appointment for Colonoscopy due to transportation ([facility])    Current Medications: Current Meds  Medication Sig   amLODipine (NORVASC) 10 MG  tablet TAKE 1 TABLET BY MOUTH EVERY DAY   Blood Glucose Monitoring Suppl (BLOOD GLUCOSE MONITOR SYSTEM) w/Device KIT USE AS DIRECTED.   fluticasone (FLONASE) 50 MCG/ACT nasal spray SPRAY 1 SPRAY IN EACH NOSTRIL ONCE DAILY.   gabapentin (NEURONTIN) 100 MG capsule TAKE ONE CAPSULE BY MOUTH 3 TIMES A DAY   glucose blood (RIGHTEST GS550 BLOOD GLUCOSE) test strip USE AS DIRECTED.   irbesartan (AVAPRO) 300 MG tablet Take 1 tablet (300 mg total) by mouth daily.   Rightest GL300 Lancets MISC USE AS DIRECTED.   Semaglutide,0.25 or 0.5MG /DOS, (OZEMPIC, 0.25 OR 0.5 MG/DOSE,) 2 MG/3ML SOPN Inject 0.25 mg into the skin once a week.     Allergies:   Metformin and related and Peanut-containing drug products   Social History   Socioeconomic History   Marital status: Single    Spouse name: Not on file   Number of children: 2   Years of education: Not on file   Highest education level: 12th grade  Occupational History   Not on file  Tobacco Use   Smoking status: Some Days    Years: 15    Types: Cigarettes   Smokeless tobacco: Never   Tobacco comments:    The only time he smokes is when he has a beer 1 cigarette. 1 pack of cigarettes lasts ~2 weeks.  Vaping Use   Vaping Use: Never used  Substance and Sexual Activity   Alcohol use: Yes  Alcohol/week: 1.0 standard drink of alcohol    Types: 1 Cans of beer per week    Comment: 1-2 beers a week   Drug use: Never   Sexual activity: Yes  Other Topics Concern   Not on file  Social History Narrative   Not on file   Social Determinants of Health   Financial Resource Strain: High Risk (07/10/2018)   Overall Financial Resource Strain (CARDIA)    Difficulty of Paying Living Expenses: Very hard  Food Insecurity: No Food Insecurity (04/12/2021)   Hunger Vital Sign    Worried About Running Out of Food in the Last Year: Never true    Ran Out of Food in the Last Year: Never true  Transportation Needs: No Transportation Needs (04/12/2021)   PRAPARE -  Administrator, Civil Service (Medical): No    Lack of Transportation (Non-Medical): No  Physical Activity: Not on file  Stress: Not on file  Social Connections: Not on file     Family History: The patient's family history includes Alzheimer's disease in his father; Constipation in his father; Deep vein thrombosis in his sister; Hypertension in his maternal grandmother and mother.  ROS:   Please see the history of present illness.     All other systems reviewed and are negative.  EKGs/Labs/Other Studies Reviewed:    The following studies were reviewed today:   EKG:  EKG is  ordered today.  The ekg ordered today demonstrates normal sinus rhythm, inferior infarct.  Recent Labs: 10/24/2022: ALT 20; BUN 17; Creatinine, Ser 1.41; Hemoglobin 15.5; Platelets 269; Potassium 4.3; Sodium 140; TSH 1.140  Recent Lipid Panel    Component Value Date/Time   CHOL 156 10/24/2022 1421   TRIG 70 10/24/2022 1421   HDL 69 10/24/2022 1421   CHOLHDL 2.3 10/24/2022 1421   LDLCALC 73 10/24/2022 1421     Risk Assessment/Calculations:     HYPERTENSION CONTROL Vitals:   02/25/23 1340 02/25/23 1345  BP: (!) 148/90 (!) 150/100    The patient's blood pressure is elevated above target today.  In order to address the patient's elevated BP: Blood pressure will be monitored at home to determine if medication changes need to be made.            Physical Exam:    VS:  BP (!) 150/100 (BP Location: Right Arm, Patient Position: Sitting, Cuff Size: Large)   Pulse 79   Ht 5\' 9"  (1.753 m)   Wt 289 lb (131.1 kg)   SpO2 98%   BMI 42.68 kg/m     Wt Readings from Last 3 Encounters:  02/25/23 289 lb (131.1 kg)  01/28/23 284 lb (128.8 kg)  12/27/22 282 lb 3.2 oz (128 kg)     GEN:  Well nourished, well developed in no acute distress HEENT: Normal NECK: No JVD; No carotid bruits CARDIAC: RRR, no murmurs, rubs, gallops RESPIRATORY:  Clear to auscultation without rales, wheezing or  rhonchi  ABDOMEN: Soft, non-tender, non-distended MUSCULOSKELETAL:  No edema; No deformity  SKIN: Warm and dry NEUROLOGIC:  Alert and oriented x 3 PSYCHIATRIC:  Normal affect   ASSESSMENT:    1. Palpitations   2. Primary hypertension   3. Morbid obesity (HCC)    PLAN:    In order of problems listed above:  Palpitations, place cardiac monitor to evaluate any significant arrhythmias. Hypertension, BP elevated, usually controlled.  Continue Avapro, Norvasc. Morbid obesity, increased activity, weight loss advised.  Follow-up after cardiac monitor.  Medication Adjustments/Labs and Tests Ordered: Current medicines are reviewed at length with the patient today.  Concerns regarding medicines are outlined above.  Orders Placed This Encounter  Procedures   LONG TERM MONITOR (3-14 DAYS)   EKG 12-Lead   No orders of the defined types were placed in this encounter.   Patient Instructions  Medication Instructions:   Your physician recommends that you continue on your current medications as directed. Please refer to the Current Medication list given to you today.  *If you need a refill on your cardiac medications before your next appointment, please call your pharmacy*   Lab Work:  None Ordered  If you have labs (blood work) drawn today and your tests are completely normal, you will receive your results only by: MyChart Message (if you have MyChart) OR A paper copy in the mail If you have any lab test that is abnormal or we need to change your treatment, we will call you to review the results.   Testing/Procedures:  Your physician has recommended that you wear a Zio monitor.   This monitor is a medical device that records the heart's electrical activity. Doctors most often use these monitors to diagnose arrhythmias. Arrhythmias are problems with the speed or rhythm of the heartbeat. The monitor is a small device applied to your chest. You can wear one while you do your  normal daily activities. While wearing this monitor if you have any symptoms to push the button and record what you felt. Once you have worn this monitor for the period of time provider prescribed (Usually 14 days), you will return the monitor device in the postage paid box. Once it is returned they will download the data collected and provide [place] with a report which the provider will then review and we will call you with those results. Important tips:  Avoid showering during the first 24 hours of wearing the monitor. Avoid excessive sweating to help maximize wear time. Do not submerge the device, no hot tubs, and no swimming pools. Keep any lotions or oils away from the patch. After 24 hours you may shower with the patch on. Take brief showers with your back facing the shower head.  Do not remove patch once it has been placed because that will interrupt data and decrease adhesive wear time. Push the button when you have any symptoms and write down what you were feeling. Once you have completed wearing your monitor, remove and place into box which has postage paid and place in your outgoing mailbox.  If for some reason you have misplaced your box then call our office and we can provide another box and/or mail it off for you.      Follow-Up: At [facility], you and your health needs are our priority.  As part of our continuing mission to provide you with exceptional heart care, we have created designated Provider Care Teams.  These Care Teams include your primary Cardiologist (physician) and Advanced Practice Providers (APPs -  Physician Assistants and Nurse Practitioners) who all work together to provide you with the care you need, when you need it.  We recommend signing up for the patient portal called "MyChart".  Sign up information is provided on this After Visit Summary.  MyChart is used to connect with patients for Virtual Visits (Telemedicine).  Patients are able to view lab/test  results, encounter notes, upcoming appointments, etc.  Non-urgent messages can be sent to your provider as well.   To learn more  about what you can do with MyChart, go to [email or web address].    Your next appointment:   6 - 8 week(s)  Provider:   You may see Debbe Odea, MD or one of the following Advanced Practice Providers on your designated Care Team:   Nicolasa Ducking, NP Eula Listen, PA-C Cadence Fransico Michael, PA-C Charlsie Quest, NP   Signed, Debbe Odea, MD  02/25/2023 2:29 PM    [facility]

## 2023-02-25 NOTE — Patient Instructions (Signed)
Medication Instructions:   Your physician recommends that you continue on your current medications as directed. Please refer to the Current Medication list given to you today.  *If you need a refill on your cardiac medications before your next appointment, please call your pharmacy*   Lab Work:  None Ordered  If you have labs (blood work) drawn today and your tests are completely normal, you will receive your results only by: MyChart Message (if you have MyChart) OR A paper copy in the mail If you have any lab test that is abnormal or we need to change your treatment, we will call you to review the results.   Testing/Procedures:  Your physician has recommended that you wear a Zio monitor.   This monitor is a medical device that records the heart's electrical activity. Doctors most often use these monitors to diagnose arrhythmias. Arrhythmias are problems with the speed or rhythm of the heartbeat. The monitor is a small device applied to your chest. You can wear one while you do your normal daily activities. While wearing this monitor if you have any symptoms to push the button and record what you felt. Once you have worn this monitor for the period of time provider prescribed (Usually 14 days), you will return the monitor device in the postage paid box. Once it is returned they will download the data collected and provide us with a report which the provider will then review and we will call you with those results. Important tips:  Avoid showering during the first 24 hours of wearing the monitor. Avoid excessive sweating to help maximize wear time. Do not submerge the device, no hot tubs, and no swimming pools. Keep any lotions or oils away from the patch. After 24 hours you may shower with the patch on. Take brief showers with your back facing the shower head.  Do not remove patch once it has been placed because that will interrupt data and decrease adhesive wear time. Push the button  when you have any symptoms and write down what you were feeling. Once you have completed wearing your monitor, remove and place into box which has postage paid and place in your outgoing mailbox.  If for some reason you have misplaced your box then call our office and we can provide another box and/or mail it off for you.      Follow-Up: At [facility], you and your health needs are our priority.  As part of our continuing mission to provide you with exceptional heart care, we have created designated Provider Care Teams.  These Care Teams include your primary Cardiologist (physician) and Advanced Practice Providers (APPs -  Physician Assistants and Nurse Practitioners) who all work together to provide you with the care you need, when you need it.  We recommend signing up for the patient portal called "MyChart".  Sign up information is provided on this After Visit Summary.  MyChart is used to connect with patients for Virtual Visits (Telemedicine).  Patients are able to view lab/test results, encounter notes, upcoming appointments, etc.  Non-urgent messages can be sent to your provider as well.   To learn more about what you can do with MyChart, go to [email or web address].    Your next appointment:   6 - 8 week(s)  Provider:   You may see Brian Agbor-Etang, MD or one of the following Advanced Practice Providers on your designated Care Team:   Christopher Berge, NP Ryan Dunn, PA-C Cadence Furth, PA-C Sheri Hammock, NP  

## 2023-02-28 DIAGNOSIS — R002 Palpitations: Secondary | ICD-10-CM

## 2023-03-16 DIAGNOSIS — Z419 Encounter for procedure for purposes other than remedying health state, unspecified: Secondary | ICD-10-CM | POA: Diagnosis not present

## 2023-03-25 ENCOUNTER — Ambulatory Visit (INDEPENDENT_AMBULATORY_CARE_PROVIDER_SITE_OTHER): Payer: Medicaid Other | Admitting: Nurse Practitioner

## 2023-03-25 ENCOUNTER — Encounter: Payer: Self-pay | Admitting: Nurse Practitioner

## 2023-03-25 VITALS — BP 130/80 | HR 79 | Temp 97.9°F | Wt 291.6 lb

## 2023-03-25 DIAGNOSIS — I10 Essential (primary) hypertension: Secondary | ICD-10-CM

## 2023-03-25 DIAGNOSIS — E7800 Pure hypercholesterolemia, unspecified: Secondary | ICD-10-CM

## 2023-03-25 DIAGNOSIS — N1831 Chronic kidney disease, stage 3a: Secondary | ICD-10-CM

## 2023-03-25 DIAGNOSIS — Z7985 Long-term (current) use of injectable non-insulin antidiabetic drugs: Secondary | ICD-10-CM

## 2023-03-25 DIAGNOSIS — Z7689 Persons encountering health services in other specified circumstances: Secondary | ICD-10-CM | POA: Diagnosis not present

## 2023-03-25 DIAGNOSIS — E118 Type 2 diabetes mellitus with unspecified complications: Secondary | ICD-10-CM | POA: Diagnosis not present

## 2023-03-25 DIAGNOSIS — E119 Type 2 diabetes mellitus without complications: Secondary | ICD-10-CM

## 2023-03-25 DIAGNOSIS — E559 Vitamin D deficiency, unspecified: Secondary | ICD-10-CM

## 2023-03-25 MED ORDER — ROSUVASTATIN CALCIUM 5 MG PO TABS: 5.0000 mg | ORAL_TABLET | Freq: Every day | ORAL | 1 refills | Status: DC

## 2023-03-25 MED ORDER — IRBESARTAN 300 MG PO TABS: 300.0000 mg | ORAL_TABLET | Freq: Every day | ORAL | 1 refills | Status: DC

## 2023-03-25 MED ORDER — OZEMPIC (0.25 OR 0.5 MG/DOSE) 2 MG/3ML ~~LOC~~ SOPN: 0.2500 mg | PEN_INJECTOR | SUBCUTANEOUS | 2 refills | Status: DC

## 2023-03-25 MED ORDER — GABAPENTIN 100 MG PO CAPS: ORAL_CAPSULE | Freq: Three times a day (TID) | ORAL | 1 refills | Status: AC

## 2023-03-25 MED ORDER — AMLODIPINE BESYLATE 10 MG PO TABS: 10.0000 mg | ORAL_TABLET | Freq: Every day | ORAL | 1 refills | Status: DC

## 2023-03-25 NOTE — Assessment & Plan Note (Signed)
Chronic.  Controlled.  Continue with current medication regimen of Amlodipine 10mg  and Irbesartan 100mg  daily.  Refills sent today.  Labs ordered today.  Return to clinic in 6 months for reevaluation.  Call sooner if concerns arise.

## 2023-03-25 NOTE — Assessment & Plan Note (Signed)
Chronic.  Controlled.  Continue with current medication regimen of Ozempic.  Labs ordered today.  Return to clinic in 6 months for reevaluation.  Call sooner if concerns arise.  ° °

## 2023-03-25 NOTE — Progress Notes (Signed)
BP (!) 143/93   Pulse 79   Temp 97.9 F (36.6 C) (Oral)   Wt 291 lb 9.6 oz (132.3 kg)   SpO2 96%   BMI 43.06 kg/m    Subjective:    Patient ID: Anthony Lam, male    DOB: 09/15/1966, 57 y.o.   MRN: [id number]  HPI: Anthony Lam is a 57 y.o. male  Chief Complaint  Patient presents with   Hypertension   Depression    HYPERTENSION / HYPERLIPIDEMIA He has seen Cardiology for his palpitations.  They put a holter monitor on for 14 days.  He hasn't heard about his results yet.   Satisfied with current treatment? yes Duration of hypertension: years BP monitoring frequency: daily BP range: 130/80 BP medication side effects: no Past BP meds: amlodipine and Irbesartan Duration of hyperlipidemia: years Cholesterol medication side effects: no Cholesterol supplements: none Past cholesterol medications: rosuvastatin (crestor)- cardiology told him to hold off on restarting the rosuvastatin Medication compliance: excellent compliance Aspirin: no Recent stressors: no Recurrent headaches: no Visual changes: no Palpitations: no Dyspnea: no Chest pain: no Lower extremity edema: no Dizzy/lightheaded: no   DIABETES Hypoglycemic episodes:no Polydipsia/polyuria: no Visual disturbance: no Chest pain: no Paresthesias: no Glucose Monitoring: no  Accucheck frequency: Not Checking  Fasting glucose:  Post prandial:  Evening:  Before meals: Taking Insulin?: no  Long acting insulin:  Short acting insulin: Blood Pressure Monitoring: daily Retinal Examination: Up to Date Foot Exam: Up to Date Diabetic Education: Not Completed Pneumovax: Up to Date Influenza:  Up to date Aspirin: no   BACK PAIN Patient takes Gabapentin for lower back pain he only uses the medication as needed.  He does not need a refill today.     Denies HA, CP, SOB, dizziness, palpitations, visual changes, and lower extremity swelling.  Active Ambulatory Problems    Diagnosis Date Noted   Benign  hypertensive renal disease 05/11/2015   Sleep apnea 09/07/2015   CKD (chronic kidney disease) stage 3, GFR 30-59 ml/min (HCC) 07/10/2018   BPH (benign prostatic hyperplasia) 08/07/2018   Back pain 08/28/2018   Essential hypertension 05/19/2019   Chronic back pain 05/19/2019   Vitamin D deficiency 02/24/2020   Anxiety 09/15/2020   Chronic pain of right knee 12/14/2020   Fluttering sensation of heart 04/25/2021   Complaint of nasal congestion 04/25/2021   Controlled diabetes mellitus type 2 with complications (HCC)    Hypercholesteremia 04/23/2022   History of colonic polyps 01/28/2023   Resolved Ambulatory Problems    Diagnosis Date Noted   No Resolved Ambulatory Problems   Past Medical History:  Diagnosis Date   Chronic kidney disease    Diabetes mellitus without complication (HCC)    Hypertension    Migraines    Seizures (HCC)    TB (tuberculosis), treated 2009   Past Surgical History:  Procedure Laterality Date   APPENDECTOMY     COLONOSCOPY WITH PROPOFOL N/A 01/26/2020   Procedure: COLONOSCOPY WITH PROPOFOL;  Surgeon: Wyline Mood, MD;  Location: [facility] ENDOSCOPY;  Service: Gastroenterology;  Laterality: N/A;   COLONOSCOPY WITH PROPOFOL N/A 01/28/2023   Procedure: COLONOSCOPY WITH PROPOFOL;  Surgeon: Wyline Mood, MD;  Location: [facility] ENDOSCOPY;  Service: Gastroenterology;  Laterality: N/A;  Patient needs a late morning appointment for Colonoscopy due to transportation ([facility])   Family History  Problem Relation Age of Onset   Hypertension Mother    Constipation Father        chronic   Alzheimer's disease Father  Deep vein thrombosis Sister    Hypertension Maternal Grandmother      Review of Systems  Eyes:  Negative for visual disturbance.  Respiratory:  Negative for shortness of breath.   Cardiovascular:  Negative for chest pain and leg swelling.  Musculoskeletal:  Positive for back pain.  Neurological:  Negative for light-headedness and headaches.    Per HPI unless  specifically indicated above     Objective:    BP (!) 143/93   Pulse 79   Temp 97.9 F (36.6 C) (Oral)   Wt 291 lb 9.6 oz (132.3 kg)   SpO2 96%   BMI 43.06 kg/m   Wt Readings from Last 3 Encounters:  03/25/23 291 lb 9.6 oz (132.3 kg)  02/25/23 289 lb (131.1 kg)  01/28/23 284 lb (128.8 kg)    Physical Exam Vitals and nursing note reviewed.  Constitutional:      General: He is not in acute distress.    Appearance: Normal appearance. He is obese. He is not ill-appearing, toxic-appearing or diaphoretic.  HENT:     Head: Normocephalic.     Right Ear: External ear normal.     Left Ear: External ear normal.     Nose: Nose normal. No congestion or rhinorrhea.     Mouth/Throat:     Mouth: Mucous membranes are moist.  Eyes:     General:        Right eye: No discharge.        Left eye: No discharge.     Extraocular Movements: Extraocular movements intact.     Conjunctiva/sclera: Conjunctivae normal.     Pupils: Pupils are equal, round, and reactive to light.  Cardiovascular:     Rate and Rhythm: Normal rate and regular rhythm.     Heart sounds: No murmur heard. Pulmonary:     Effort: Pulmonary effort is normal. No respiratory distress.     Breath sounds: Normal breath sounds. No wheezing, rhonchi or rales.  Abdominal:     General: Abdomen is flat. Bowel sounds are normal.  Musculoskeletal:     Cervical back: Normal range of motion and neck supple.  Skin:    General: Skin is warm and dry.     Capillary Refill: Capillary refill takes less than 2 seconds.  Neurological:     General: No focal deficit present.     Mental Status: He is alert and oriented to person, place, and time.  Psychiatric:        Mood and Affect: Mood normal.        Behavior: Behavior normal.        Thought Content: Thought content normal.        Judgment: Judgment normal.     Results for orders placed or performed during the hospital encounter of 01/28/23  Glucose, capillary  Result Value Ref  Range   Glucose-Capillary 105 (H) 70 - 99 mg/dL      Assessment & Plan:   Problem List Items Addressed This Visit       Cardiovascular and Mediastinum   Essential hypertension    Chronic.  Controlled.  Continue with current medication regimen of Amlodipine 10mg  and Irbesartan 100mg  daily.  Refills sent today.  Labs ordered today.  Return to clinic in 6 months for reevaluation.  Call sooner if concerns arise.          Endocrine   Controlled diabetes mellitus type 2 with complications (HCC) - Primary    Chronic.  Controlled.  Continue with current  medication regimen of Ozempic.  Labs ordered today.  Return to clinic in 6 months for reevaluation.  Call sooner if concerns arise.          Genitourinary   CKD (chronic kidney disease) stage 3, GFR 30-59 ml/min (HCC)    Labs ordered at visit today.  Will make recommendations based on lab results.          Other   Vitamin D deficiency    Labs ordered at visit today.  Will make recommendations based on lab results.        Hypercholesteremia    Chronic. Continue with Crestor 5mg  daily.  Labs ordered today.  Follow up in 6 months.  Call sooner if concerns arise.          Follow up plan: No follow-ups on file.

## 2023-03-25 NOTE — Assessment & Plan Note (Signed)
Labs ordered at visit today.  Will make recommendations based on lab results.   

## 2023-03-25 NOTE — Assessment & Plan Note (Signed)
Chronic. Continue with Crestor 5mg daily.  Labs ordered today.  Follow up in 6 months.  Call sooner if concerns arise.   

## 2023-03-26 LAB — COMPREHENSIVE METABOLIC PANEL
ALT: 17 IU/L (ref 0–44)
AST: 15 IU/L (ref 0–40)
Albumin/Globulin Ratio: 1.3
Albumin: 4.2 g/dL (ref 3.8–4.9)
Alkaline Phosphatase: 79 IU/L (ref 44–121)
BUN/Creatinine Ratio: 11 (ref 9–20)
BUN: 15 mg/dL (ref 6–24)
Bilirubin Total: 0.4 mg/dL (ref 0.0–1.2)
CO2: 23 mmol/L (ref 20–29)
Calcium: 9.6 mg/dL (ref 8.7–10.2)
Chloride: 104 mmol/L (ref 96–106)
Creatinine, Ser: 1.36 mg/dL — ABNORMAL HIGH (ref 0.76–1.27)
Globulin, Total: 3.2 g/dL (ref 1.5–4.5)
Glucose: 89 mg/dL (ref 70–99)
Potassium: 4.1 mmol/L (ref 3.5–5.2)
Sodium: 140 mmol/L (ref 134–144)
Total Protein: 7.4 g/dL (ref 6.0–8.5)
eGFR: 61 mL/min/{1.73_m2} (ref >59)

## 2023-03-26 LAB — LIPID PANEL
Chol/HDL Ratio: 3.7 ratio (ref 0.0–5.0)
Cholesterol, Total: 161 mg/dL (ref 100–199)
HDL: 43 mg/dL (ref >39)
LDL Chol Calc (NIH): 74 mg/dL (ref 0–99)
Triglycerides: 269 mg/dL — ABNORMAL HIGH (ref 0–149)
VLDL Cholesterol Cal: 44 mg/dL — ABNORMAL HIGH (ref 5–40)

## 2023-03-26 LAB — VITAMIN D 25 HYDROXY (VIT D DEFICIENCY, FRACTURES): Vit D, 25-Hydroxy: 28 ng/mL — ABNORMAL LOW (ref 30.0–100.0)

## 2023-03-26 LAB — HEMOGLOBIN A1C
Est. average glucose Bld gHb Est-mCnc: 123 mg/dL
Hgb A1c MFr Bld: 5.9 % — ABNORMAL HIGH (ref 4.8–5.6)

## 2023-03-26 NOTE — Progress Notes (Signed)
HI Anthony Lam. It was nice to see you yesterday.  Your lab work looks good.  Your kidney function remains stable.  Your A1c is well controlled at 5.9%.  No concerns at this time. Continue with your current medication regimen.  Follow up as discussed.  Please let me know if you have any questions.

## 2023-04-15 DIAGNOSIS — Z419 Encounter for procedure for purposes other than remedying health state, unspecified: Secondary | ICD-10-CM | POA: Diagnosis not present

## 2023-04-19 ENCOUNTER — Encounter: Payer: Self-pay | Admitting: Cardiology

## 2023-04-19 ENCOUNTER — Ambulatory Visit: Payer: Medicaid Other | Attending: Cardiology | Admitting: Cardiology

## 2023-04-19 VITALS — BP 132/82 | HR 77 | Ht 69.0 in | Wt 287.8 lb

## 2023-04-19 DIAGNOSIS — I471 Supraventricular tachycardia, unspecified: Secondary | ICD-10-CM

## 2023-04-19 DIAGNOSIS — Z7689 Persons encountering health services in other specified circumstances: Secondary | ICD-10-CM | POA: Diagnosis not present

## 2023-04-19 DIAGNOSIS — I10 Essential (primary) hypertension: Secondary | ICD-10-CM | POA: Diagnosis not present

## 2023-04-19 DIAGNOSIS — E785 Hyperlipidemia, unspecified: Secondary | ICD-10-CM | POA: Diagnosis not present

## 2023-04-19 MED ORDER — METOPROLOL SUCCINATE ER 25 MG PO TB24: 25.0000 mg | ORAL_TABLET | Freq: Every day | ORAL | 3 refills | Status: DC

## 2023-04-19 NOTE — Patient Instructions (Signed)
Medication Instructions:  Your physician has recommended you make the following change in your medication:  START - metoprolol succinate (TOPROL XL) 25 MG 24 hr tablet - Take 1 tablet (25 mg total) by mouth daily  *If you need a refill on your cardiac medications before your next appointment, please call your pharmacy*  Lab Work: -None ordered  Testing/Procedures: Your physician has requested that you have an echocardiogram. Echocardiography is a painless test that uses sound waves to create images of your heart. It provides your doctor with information about the size and shape of your heart and how well your heart's chambers and valves are working. This procedure takes approximately one hour. There are no restrictions for this procedure. Please do NOT wear cologne, perfume, aftershave, or lotions (deodorant is allowed). Please arrive 15 minutes prior to your appointment time.   Follow-Up: At [facility], you and your health needs are our priority.  As part of our continuing mission to provide you with exceptional heart care, we have created designated Provider Care Teams.  These Care Teams include your primary Cardiologist (physician) and Advanced Practice Providers (APPs -  Physician Assistants and Nurse Practitioners) who all work together to provide you with the care you need, when you need it.  Your next appointment:   3 month(s)  Provider:   You may see Debbe Odea, MD or one of the following Advanced Practice Providers on your designated Care Team:   Nicolasa Ducking, NP Eula Listen, PA-C Cadence Fransico Michael, PA-C Charlsie Quest, NP    Other Instructions -None ordered

## 2023-04-19 NOTE — Progress Notes (Signed)
Cardiology Office Note:    Date:  04/19/2023   ID:  Anthony Lam, DOB Mar 12, 1966, MRN [id number]  PCP:  Larae Grooms, NP   [facility] Providers Cardiologist:  Debbe Odea, MD     Referring MD: Larae Grooms, NP   Chief Complaint  Patient presents with   Follow-up    Patient denies new or acute cardiac problems/concerns today.  Still holding Rosuvastatin with no improvement of heart "flutters".      History of Present Illness:    Anthony Lam is a 57 y.o. male with a hx of hypertension, CKD 3, smoker who presents for follow-up.  Previously seen due to palpitations.    Cardiac monitor was placed to evaluate any significant arrhythmias.  Still has occasional heart flutters.  Presents for cardiac monitor results.  Occasionally drinks sugary drinks.  Does not drink much coffee.   Past Medical History:  Diagnosis Date   Chronic kidney disease    stage 3a   Diabetes mellitus without complication (HCC)    Hypertension    Migraines    Seizures (HCC)    Sleep apnea    TB (tuberculosis), treated 2009    Past Surgical History:  Procedure Laterality Date   APPENDECTOMY     COLONOSCOPY WITH PROPOFOL N/A 01/26/2020   Procedure: COLONOSCOPY WITH PROPOFOL;  Surgeon: Wyline Mood, MD;  Location: [facility] ENDOSCOPY;  Service: Gastroenterology;  Laterality: N/A;   COLONOSCOPY WITH PROPOFOL N/A 01/28/2023   Procedure: COLONOSCOPY WITH PROPOFOL;  Surgeon: Wyline Mood, MD;  Location: [facility] ENDOSCOPY;  Service: Gastroenterology;  Laterality: N/A;  Patient needs a late morning appointment for Colonoscopy due to transportation ([facility])    Current Medications: Current Meds  Medication Sig   amLODipine (NORVASC) 10 MG tablet Take 1 tablet (10 mg total) by mouth daily.   Blood Glucose Monitoring Suppl (BLOOD GLUCOSE MONITOR SYSTEM) w/Device KIT USE AS DIRECTED.   fluticasone (FLONASE) 50 MCG/ACT nasal spray SPRAY 1 SPRAY IN EACH NOSTRIL ONCE DAILY.   gabapentin (NEURONTIN)  100 MG capsule TAKE ONE CAPSULE BY MOUTH 3 TIMES A DAY   glucose blood (RIGHTEST GS550 BLOOD GLUCOSE) test strip USE AS DIRECTED.   irbesartan (AVAPRO) 300 MG tablet Take 1 tablet (300 mg total) by mouth daily.   metoprolol succinate (TOPROL XL) 25 MG 24 hr tablet Take 1 tablet (25 mg total) by mouth daily.   Rightest GL300 Lancets MISC USE AS DIRECTED.   Semaglutide,0.25 or 0.5MG /DOS, (OZEMPIC, 0.25 OR 0.5 MG/DOSE,) 2 MG/3ML SOPN Inject 0.25 mg into the skin once a week.     Allergies:   Metformin and related and Peanut-containing drug products   Social History   Socioeconomic History   Marital status: Single    Spouse name: Not on file   Number of children: 2   Years of education: Not on file   Highest education level: 12th grade  Occupational History   Not on file  Tobacco Use   Smoking status: Some Days    Years: 15    Types: Cigarettes   Smokeless tobacco: Never   Tobacco comments:    The only time he smokes is when he has a beer 1 cigarette. 1 pack of cigarettes lasts ~2 weeks.  Vaping Use   Vaping Use: Never used  Substance and Sexual Activity   Alcohol use: Yes    Alcohol/week: 1.0 standard drink of alcohol    Types: 1 Cans of beer per week    Comment: 1-2 beers a week  Drug use: Never   Sexual activity: Yes  Other Topics Concern   Not on file  Social History Narrative   Not on file   Social Determinants of Health   Financial Resource Strain: High Risk (07/10/2018)   Overall Financial Resource Strain (CARDIA)    Difficulty of Paying Living Expenses: Very hard  Food Insecurity: No Food Insecurity (04/12/2021)   Hunger Vital Sign    Worried About Running Out of Food in the Last Year: Never true    Ran Out of Food in the Last Year: Never true  Transportation Needs: No Transportation Needs (04/12/2021)   PRAPARE - Administrator, Civil Service (Medical): No    Lack of Transportation (Non-Medical): No  Physical Activity: Not on file  Stress: Not on  file  Social Connections: Not on file     Family History: The patient's family history includes Alzheimer's disease in his father; Constipation in his father; Deep vein thrombosis in his sister; Hypertension in his maternal grandmother and mother.  ROS:   Please see the history of present illness.     All other systems reviewed and are negative.  EKGs/Labs/Other Studies Reviewed:    The following studies were reviewed today:   EKG:  EKG not ordered today.    Recent Labs: 10/24/2022: Hemoglobin 15.5; Platelets 269; TSH 1.140 03/25/2023: ALT 17; BUN 15; Creatinine, Ser 1.36; Potassium 4.1; Sodium 140  Recent Lipid Panel    Component Value Date/Time   CHOL 161 03/25/2023 1503   TRIG 269 (H) 03/25/2023 1503   HDL 43 03/25/2023 1503   CHOLHDL 3.7 03/25/2023 1503   LDLCALC 74 03/25/2023 1503     Risk Assessment/Calculations:               Physical Exam:    VS:  BP 132/82 (BP Location: Left Arm, Patient Position: Sitting, Cuff Size: Large)   Pulse 77   Ht 5\' 9"  (1.753 m)   Wt 287 lb 12.8 oz (130.5 kg)   SpO2 93%   BMI 42.50 kg/m     Wt Readings from Last 3 Encounters:  04/19/23 287 lb 12.8 oz (130.5 kg)  03/25/23 291 lb 9.6 oz (132.3 kg)  02/25/23 289 lb (131.1 kg)     GEN:  Well nourished, well developed in no acute distress HEENT: Normal NECK: No JVD; No carotid bruits CARDIAC: RRR, no murmurs, rubs, gallops RESPIRATORY:  Clear to auscultation without rales, wheezing or rhonchi  ABDOMEN: Soft, non-tender, non-distended MUSCULOSKELETAL:  No edema; No deformity  SKIN: Warm and dry NEUROLOGIC:  Alert and oriented x 3 PSYCHIATRIC:  Normal affect   ASSESSMENT:    1. Paroxysmal SVT (supraventricular tachycardia)   2. Primary hypertension   3. Morbid obesity (HCC)   4. Hyperlipidemia, unspecified hyperlipidemia type     PLAN:    In order of problems listed above:  Palpitations, paroxysmal SVT on cardiac monitor, occasional PVCs 1.7% burden.  Start  Toprol-XL 25 mg daily.  Obtain echocardiogram. Hypertension, BP reasonable.  Start Toprol-XL as above continue Avapro 300 mg daily, Norvasc 10 mg daily. Morbid obesity, low-calorie diet, advised to cut back on sugary drinks.  Exercise also recommended. Elevated triglycerides, advised to restart Crestor as prescribed.  Follow-up in 3 months.     Medication Adjustments/Labs and Tests Ordered: Current medicines are reviewed at length with the patient today.  Concerns regarding medicines are outlined above.  Orders Placed This Encounter  Procedures   ECHOCARDIOGRAM COMPLETE   Meds ordered this  encounter  Medications   metoprolol succinate (TOPROL XL) 25 MG 24 hr tablet    Sig: Take 1 tablet (25 mg total) by mouth daily.    Dispense:  90 tablet    Refill:  3    Patient Instructions  Medication Instructions:  Your physician has recommended you make the following change in your medication:  START - metoprolol succinate (TOPROL XL) 25 MG 24 hr tablet - Take 1 tablet (25 mg total) by mouth daily  *If you need a refill on your cardiac medications before your next appointment, please call your pharmacy*  Lab Work: -None ordered  Testing/Procedures: Your physician has requested that you have an echocardiogram. Echocardiography is a painless test that uses sound waves to create images of your heart. It provides your doctor with information about the size and shape of your heart and how well your heart's chambers and valves are working. This procedure takes approximately one hour. There are no restrictions for this procedure. Please do NOT wear cologne, perfume, aftershave, or lotions (deodorant is allowed). Please arrive 15 minutes prior to your appointment time.   Follow-Up: At [facility], you and your health needs are our priority.  As part of our continuing mission to provide you with exceptional heart care, we have created designated Provider Care Teams.  These Care Teams  include your primary Cardiologist (physician) and Advanced Practice Providers (APPs -  Physician Assistants and Nurse Practitioners) who all work together to provide you with the care you need, when you need it.  Your next appointment:   3 month(s)  Provider:   You may see Debbe Odea, MD or one of the following Advanced Practice Providers on your designated Care Team:   Nicolasa Ducking, NP Eula Listen, PA-C Cadence Fransico Michael, PA-C Charlsie Quest, NP    Other Instructions -None ordered    Signed, Debbe Odea, MD  04/19/2023 3:39 PM    [facility]

## 2023-04-24 DIAGNOSIS — M5410 Radiculopathy, site unspecified: Secondary | ICD-10-CM | POA: Diagnosis not present

## 2023-04-24 DIAGNOSIS — Z7689 Persons encountering health services in other specified circumstances: Secondary | ICD-10-CM | POA: Diagnosis not present

## 2023-05-16 ENCOUNTER — Ambulatory Visit: Payer: Medicaid Other | Attending: Cardiology

## 2023-05-16 DIAGNOSIS — I10 Essential (primary) hypertension: Secondary | ICD-10-CM | POA: Diagnosis not present

## 2023-05-16 DIAGNOSIS — I471 Supraventricular tachycardia, unspecified: Secondary | ICD-10-CM | POA: Diagnosis not present

## 2023-05-16 DIAGNOSIS — Z419 Encounter for procedure for purposes other than remedying health state, unspecified: Secondary | ICD-10-CM | POA: Diagnosis not present

## 2023-05-16 LAB — ECHOCARDIOGRAM COMPLETE
Area-P 1/2: 3.91 cm2
S' Lateral: 2.5 cm

## 2023-06-05 ENCOUNTER — Other Ambulatory Visit: Payer: Self-pay | Admitting: Nurse Practitioner

## 2023-06-05 DIAGNOSIS — G4733 Obstructive sleep apnea (adult) (pediatric): Secondary | ICD-10-CM | POA: Diagnosis not present

## 2023-06-06 NOTE — Telephone Encounter (Signed)
Requested Prescriptions  Pending Prescriptions Disp Refills   Semaglutide,0.25 or 0.5MG /DOS, (OZEMPIC, 0.25 OR 0.5 MG/DOSE,) 2 MG/3ML SOPN [Pharmacy Med Name: OZEMPIC 0.25 OR 0.5MG /DOS1X2MG  3ML] 3 mL 0    Sig: INJECT 0.25 MG UNDER THE SKIN ONCE A WEEK     Endocrinology:  Diabetes - GLP-1 Receptor Agonists - semaglutide Failed - 06/05/2023  9:37 AM      Failed - HBA1C in normal range and within 180 days    Hemoglobin A1C  Date Value Ref Range Status  05/11/2015 6.1  Final   Hgb A1c MFr Bld  Date Value Ref Range Status  03/25/2023 5.9 (H) 4.8 - 5.6 % Final    Comment:             Prediabetes: 5.7 - 6.4          Diabetes: >6.4          Glycemic control for adults with diabetes: <7.0          Failed - Cr in normal range and within 360 days    Creatinine  Date Value Ref Range Status  08/04/2013 1.68 (H) 0.60 - 1.30 mg/dL Final   Creatinine, Ser  Date Value Ref Range Status  03/25/2023 1.36 (H) 0.76 - 1.27 mg/dL Final         Passed - Valid encounter within last 6 months    Recent Outpatient Visits           2 months ago Diabetes mellitus without complication (HCC)   [facility] Larae Grooms, NP   5 months ago Palpitations   [facility] Larae Grooms, NP   7 months ago Annual physical exam   [facility] Larae Grooms, NP   10 months ago Hypercholesteremia   [facility] Larae Grooms, NP   1 year ago Essential hypertension   [facility] Larae Grooms, NP       Future Appointments             In 1 month Agbor-Etang, Arlys John, MD [facility]   In 4 months Larae Grooms, NP [place] [facility]

## 2023-06-16 DIAGNOSIS — Z419 Encounter for procedure for purposes other than remedying health state, unspecified: Secondary | ICD-10-CM | POA: Diagnosis not present

## 2023-06-25 DIAGNOSIS — I10 Essential (primary) hypertension: Secondary | ICD-10-CM | POA: Diagnosis not present

## 2023-06-25 DIAGNOSIS — N1831 Chronic kidney disease, stage 3a: Secondary | ICD-10-CM | POA: Diagnosis not present

## 2023-07-04 DIAGNOSIS — Z7689 Persons encountering health services in other specified circumstances: Secondary | ICD-10-CM | POA: Diagnosis not present

## 2023-07-04 DIAGNOSIS — I10 Essential (primary) hypertension: Secondary | ICD-10-CM | POA: Diagnosis not present

## 2023-07-04 DIAGNOSIS — E785 Hyperlipidemia, unspecified: Secondary | ICD-10-CM | POA: Diagnosis not present

## 2023-07-04 DIAGNOSIS — E119 Type 2 diabetes mellitus without complications: Secondary | ICD-10-CM | POA: Diagnosis not present

## 2023-07-04 DIAGNOSIS — N1831 Chronic kidney disease, stage 3a: Secondary | ICD-10-CM | POA: Diagnosis not present

## 2023-07-04 DIAGNOSIS — R634 Abnormal weight loss: Secondary | ICD-10-CM | POA: Diagnosis not present

## 2023-07-07 DIAGNOSIS — G4733 Obstructive sleep apnea (adult) (pediatric): Secondary | ICD-10-CM | POA: Diagnosis not present

## 2023-07-11 DIAGNOSIS — M5410 Radiculopathy, site unspecified: Secondary | ICD-10-CM | POA: Diagnosis not present

## 2023-07-16 DIAGNOSIS — Z419 Encounter for procedure for purposes other than remedying health state, unspecified: Secondary | ICD-10-CM | POA: Diagnosis not present

## 2023-07-23 DIAGNOSIS — E113393 Type 2 diabetes mellitus with moderate nonproliferative diabetic retinopathy without macular edema, bilateral: Secondary | ICD-10-CM | POA: Diagnosis not present

## 2023-07-26 ENCOUNTER — Ambulatory Visit: Payer: Medicaid Other | Attending: Cardiology | Admitting: Cardiology

## 2023-07-26 ENCOUNTER — Encounter: Payer: Self-pay | Admitting: Cardiology

## 2023-07-26 VITALS — BP 138/100 | HR 79 | Ht 69.0 in | Wt 290.0 lb

## 2023-07-26 DIAGNOSIS — E785 Hyperlipidemia, unspecified: Secondary | ICD-10-CM

## 2023-07-26 DIAGNOSIS — I471 Supraventricular tachycardia, unspecified: Secondary | ICD-10-CM | POA: Diagnosis not present

## 2023-07-26 DIAGNOSIS — I10 Essential (primary) hypertension: Secondary | ICD-10-CM

## 2023-07-26 DIAGNOSIS — Z7689 Persons encountering health services in other specified circumstances: Secondary | ICD-10-CM | POA: Diagnosis not present

## 2023-07-26 MED ORDER — METOPROLOL SUCCINATE ER 50 MG PO TB24: 50.0000 mg | ORAL_TABLET | Freq: Every day | ORAL | 0 refills | Status: DC

## 2023-07-26 NOTE — Progress Notes (Signed)
Cardiology Office Note:    Date:  07/26/2023   ID:  Anthony Lam, DOB 09/21/1966, MRN [id number]  PCP:  Anthony Grooms, NP   [facility] Providers Cardiologist:  Anthony Odea, MD     Referring MD: Anthony Grooms, NP   No chief complaint on file.   History of Present Illness:    Anthony Lam is a 57 y.o. male with a hx of hypertension, paroxysmal SVT, CKD 3, smoker who presents for follow-up.  Previously seen due to palpitations.    Cardiac monitor showed paroxysmal SVT, PVCs 1.7% burden.  Started on Toprol-XL 25 mg daily with good effect, symptoms overall improved but still present.  Blood pressure at home ranges in systolics 130s.  Denies dizziness, chest pain or shortness of breath.  Compliant with medications as prescribed.  Prior notes/testing Echo 05/2023 EF 60 to 65%, mild MR. Cardiac monitor 6/24 paroxysmal SVT, occasional PVCs 1.7% burden.    Past Medical History:  Diagnosis Date   Chronic kidney disease    stage 3a   Diabetes mellitus without complication (HCC)    Hypertension    Migraines    Seizures (HCC)    Sleep apnea    TB (tuberculosis), treated 2009    Past Surgical History:  Procedure Laterality Date   APPENDECTOMY     COLONOSCOPY WITH PROPOFOL N/A 01/26/2020   Procedure: COLONOSCOPY WITH PROPOFOL;  Surgeon: Anthony Mood, MD;  Location: [facility] ENDOSCOPY;  Service: Gastroenterology;  Laterality: N/A;   COLONOSCOPY WITH PROPOFOL N/A 01/28/2023   Procedure: COLONOSCOPY WITH PROPOFOL;  Surgeon: Anthony Mood, MD;  Location: [facility] ENDOSCOPY;  Service: Gastroenterology;  Laterality: N/A;  Patient needs a late morning appointment for Colonoscopy due to transportation ([place])    Current Medications: Current Meds  Medication Sig   amLODipine (NORVASC) 10 MG tablet Take 1 tablet (10 mg total) by mouth daily.   atorvastatin (LIPITOR) 10 MG tablet Take 1 tablet by mouth daily.   Blood Glucose Monitoring Suppl (BLOOD GLUCOSE MONITOR SYSTEM)  w/Device KIT USE AS DIRECTED.   fluticasone (FLONASE) 50 MCG/ACT nasal spray SPRAY 1 SPRAY IN EACH NOSTRIL ONCE DAILY.   gabapentin (NEURONTIN) 100 MG capsule TAKE ONE CAPSULE BY MOUTH 3 TIMES A DAY   glucose blood (RIGHTEST GS550 BLOOD GLUCOSE) test strip USE AS DIRECTED.   irbesartan (AVAPRO) 300 MG tablet Take 1 tablet (300 mg total) by mouth daily.   Rightest GL300 Lancets MISC USE AS DIRECTED.   rosuvastatin (CRESTOR) 5 MG tablet Take 1 tablet (5 mg total) by mouth daily.   Semaglutide,0.25 or 0.5MG /DOS, (OZEMPIC, 0.25 OR 0.5 MG/DOSE,) 2 MG/3ML SOPN INJECT 0.25 MG UNDER THE SKIN ONCE A WEEK   [DISCONTINUED] metoprolol succinate (TOPROL XL) 25 MG 24 hr tablet Take 1 tablet (25 mg total) by mouth daily.     Allergies:   Metformin and related and Peanut-containing drug products   Social History   Socioeconomic History   Marital status: Single    Spouse name: Not on file   Number of children: 2   Years of education: Not on file   Highest education level: 12th grade  Occupational History   Not on file  Tobacco Use   Smoking status: Some Days    Types: Cigarettes   Smokeless tobacco: Never   Tobacco comments:    The only time he smokes is when he has a beer 1 cigarette. 1 pack of cigarettes lasts ~2 weeks.  Vaping Use   Vaping status: Never Used  Substance and Sexual Activity   Alcohol use: Yes    Alcohol/week: 1.0 standard drink of alcohol    Types: 1 Cans of beer per week    Comment: 1-2 beers a week   Drug use: Never   Sexual activity: Yes  Other Topics Concern   Not on file  Social History Narrative   Not on file   Social Determinants of Health   Financial Resource Strain: High Risk (07/10/2018)   Overall Financial Resource Strain (CARDIA)    Difficulty of Paying Living Expenses: Very hard  Food Insecurity: No Food Insecurity (04/12/2021)   Hunger Vital Sign    Worried About Running Out of Food in the Last Year: Never true    Ran Out of Food in the Last Year: Never  true  Transportation Needs: No Transportation Needs (04/12/2021)   PRAPARE - Administrator, Civil Service (Medical): No    Lack of Transportation (Non-Medical): No  Physical Activity: Not on file  Stress: Not on file  Social Connections: Not on file     Family History: The patient's family history includes Alzheimer's disease in his father; Constipation in his father; Deep vein thrombosis in his sister; Hypertension in his maternal grandmother and mother.  ROS:   Please see the history of present illness.     All other systems reviewed and are negative.  EKGs/Labs/Other Studies Reviewed:    The following studies were reviewed today:   EKG:  EKG not ordered today.    Recent Labs: 10/24/2022: Hemoglobin 15.5; Platelets 269; TSH 1.140 03/25/2023: ALT 17; BUN 15; Creatinine, Ser 1.36; Potassium 4.1; Sodium 140  Recent Lipid Panel    Component Value Date/Time   CHOL 161 03/25/2023 1503   TRIG 269 (H) 03/25/2023 1503   HDL 43 03/25/2023 1503   CHOLHDL 3.7 03/25/2023 1503   LDLCALC 74 03/25/2023 1503     Risk Assessment/Calculations:      Physical Exam:    VS:  BP (!) 138/100   Pulse 79   Ht 5\' 9"  (1.753 m)   Wt 290 lb (131.5 kg)   SpO2 96%   BMI 42.83 kg/m     Wt Readings from Last 3 Encounters:  07/26/23 290 lb (131.5 kg)  04/19/23 287 lb 12.8 oz (130.5 kg)  03/25/23 291 lb 9.6 oz (132.3 kg)     GEN:  Well nourished, well developed in no acute distress HEENT: Normal NECK: No JVD; No carotid bruits CARDIAC: RRR, no murmurs, rubs, gallops RESPIRATORY:  Clear to auscultation without rales, wheezing or rhonchi  ABDOMEN: Soft, non-tender, non-distended MUSCULOSKELETAL:  No edema; No deformity  SKIN: Warm and dry NEUROLOGIC:  Alert and oriented x 3 PSYCHIATRIC:  Normal affect   ASSESSMENT:    1. Paroxysmal SVT (supraventricular tachycardia) (HCC)   2. Primary hypertension   3. Morbid obesity (HCC)   4. Hyperlipidemia, unspecified hyperlipidemia  type    PLAN:    In order of problems listed above:  paroxysmal SVT, occasional PVCs 1.7% burden.  Echo 8/24 EF 60 to 65%.  Symptoms of palpitations improved, but still present.  Increase Toprol-XL to 50 mg daily. Hypertension, BP elevated.  Increase Toprol-XL to 50 mg daily, continue Avapro 300 mg daily, Norvasc 10 mg daily. Morbid obesity, low-calorie diet, advised to cut back on sugary drinks.  Exercise also recommended. Elevated triglycerides, continue Crestor as prescribed.  Check lipid panel in 4 months  Follow-up in months.     Medication Adjustments/Labs and Tests Ordered:  Current medicines are reviewed at length with the patient today.  Concerns regarding medicines are outlined above.  Orders Placed This Encounter  Procedures   Lipid panel   Meds ordered this encounter  Medications   metoprolol succinate (TOPROL XL) 50 MG 24 hr tablet    Sig: Take 1 tablet (50 mg total) by mouth daily.    Dispense:  90 tablet    Refill:  0    Patient Instructions  Medication Instructions:   INCREASE Metoprolol Succinate - Take one tablet (50mg )  by mouth daily  *If you need a refill on your cardiac medications before your next appointment, please call your pharmacy*   Lab Work:  Your provider would like for you to return in 4 months (on or around 12/24/2023) to have the following labs drawn: LIPID.   Please go to [facility] [address] ([facility] [address], [place] [address] You do not need an appointment.  They are open from 7:30 am-4 pm.  Lunch from 1:00 pm- 2:00 pm You WILL need to be fasting.       If you have labs (blood work) drawn today and your tests are completely normal, you will receive your results only by: MyChart Message (if you have MyChart) OR A paper copy in the mail If you have any lab test that is abnormal or we need to change your treatment, we will call you to review the results.   Testing/Procedures:  None  Ordered    Follow-Up: At [facility], you and your health needs are our priority.  As part of our continuing mission to provide you with exceptional heart care, we have created designated Provider Care Teams.  These Care Teams include your primary Cardiologist (physician) and Advanced Practice Providers (APPs -  Physician Assistants and Nurse Practitioners) who all work together to provide you with the care you need, when you need it.  We recommend signing up for the patient portal called "MyChart".  Sign up information is provided on this After Visit Summary.  MyChart is used to connect with patients for Virtual Visits (Telemedicine).  Patients are able to view lab/test results, encounter notes, upcoming appointments, etc.  Non-urgent messages can be sent to your provider as well.   To learn more about what you can do with MyChart, go to [email or web address].    Your next appointment:   5 month(s)  Provider:   You may see Anthony Odea, MD or one of the following Advanced Practice Providers on your designated Care Team:   Nicolasa Ducking, NP Eula Listen, PA-C Cadence Fransico Michael, PA-C Charlsie Quest, NP   Signed, Anthony Odea, MD  07/26/2023 1:43 PM    [facility]

## 2023-07-26 NOTE — Patient Instructions (Signed)
Medication Instructions:   INCREASE Metoprolol Succinate - Take one tablet (50mg )  by mouth daily  *If you need a refill on your cardiac medications before your next appointment, please call your pharmacy*   Lab Work:  Your provider would like for you to return in 4 months (on or around 12/24/2023) to have the following labs drawn: LIPID.   Please go to [facility] [address] ([facility] [address], [place] [address] You do not need an appointment.  They are open from 7:30 am-4 pm.  Lunch from 1:00 pm- 2:00 pm You WILL need to be fasting.       If you have labs (blood work) drawn today and your tests are completely normal, you will receive your results only by: MyChart Message (if you have MyChart) OR A paper copy in the mail If you have any lab test that is abnormal or we need to change your treatment, we will call you to review the results.   Testing/Procedures:  None Ordered    Follow-Up: At [facility], you and your health needs are our priority.  As part of our continuing mission to provide you with exceptional heart care, we have created designated Provider Care Teams.  These Care Teams include your primary Cardiologist (physician) and Advanced Practice Providers (APPs -  Physician Assistants and Nurse Practitioners) who all work together to provide you with the care you need, when you need it.  We recommend signing up for the patient portal called "MyChart".  Sign up information is provided on this After Visit Summary.  MyChart is used to connect with patients for Virtual Visits (Telemedicine).  Patients are able to view lab/test results, encounter notes, upcoming appointments, etc.  Non-urgent messages can be sent to your provider as well.   To learn more about what you can do with MyChart, go to [email or web address].    Your next appointment:   5 month(s)  Provider:   You may see Debbe Odea, MD or one of the following  Advanced Practice Providers on your designated Care Team:   Nicolasa Ducking, NP Eula Listen, PA-C Cadence Fransico Michael, PA-C Charlsie Quest, NP

## 2023-08-01 DIAGNOSIS — R7303 Prediabetes: Secondary | ICD-10-CM | POA: Diagnosis not present

## 2023-08-01 DIAGNOSIS — M5410 Radiculopathy, site unspecified: Secondary | ICD-10-CM | POA: Diagnosis not present

## 2023-08-01 DIAGNOSIS — Z7689 Persons encountering health services in other specified circumstances: Secondary | ICD-10-CM | POA: Diagnosis not present

## 2023-08-01 DIAGNOSIS — M5416 Radiculopathy, lumbar region: Secondary | ICD-10-CM | POA: Diagnosis not present

## 2023-08-14 ENCOUNTER — Other Ambulatory Visit: Payer: Self-pay | Admitting: Nurse Practitioner

## 2023-08-14 MED ORDER — IRBESARTAN 300 MG PO TABS: 300.0000 mg | ORAL_TABLET | Freq: Every day | ORAL | 0 refills | Status: DC

## 2023-08-14 NOTE — Telephone Encounter (Signed)
Requested Prescriptions  Pending Prescriptions Disp Refills   irbesartan (AVAPRO) 300 MG tablet 90 tablet 0    Sig: Take 1 tablet (300 mg total) by mouth daily.     Cardiovascular:  Angiotensin Receptor Blockers Failed - 08/14/2023  8:55 AM      Failed - Cr in normal range and within 180 days    Creatinine  Date Value Ref Range Status  08/04/2013 1.68 (H) 0.60 - 1.30 mg/dL Final   Creatinine, Ser  Date Value Ref Range Status  03/25/2023 1.36 (H) 0.76 - 1.27 mg/dL Final         Failed - Last BP in normal range    BP Readings from Last 1 Encounters:  07/26/23 (!) 138/100         Passed - K in normal range and within 180 days    Potassium  Date Value Ref Range Status  03/25/2023 4.1 3.5 - 5.2 mmol/L Final  08/04/2013 3.6 3.5 - 5.1 mmol/L Final         Passed - Patient is not pregnant      Passed - Valid encounter within last 6 months    Recent Outpatient Visits           4 months ago Diabetes mellitus without complication (HCC)   [facility] Larae Grooms, NP   7 months ago Palpitations   [place] [facility] Larae Grooms, NP   9 months ago Annual physical exam   [place] [facility] Larae Grooms, NP   1 year ago Hypercholesteremia   [place] [facility] Larae Grooms, NP   1 year ago Essential hypertension   [place] [facility] Larae Grooms, NP       Future Appointments             In 2 months Larae Grooms, NP [facility]   In 4 months Agbor-Etang, Arlys John, MD [facility]

## 2023-08-14 NOTE — Telephone Encounter (Signed)
Medication Refill - Medication: irbesartan (AVAPRO) 300 MG tablet   Has the patient contacted their pharmacy? Yes.   (  Preferred Pharmacy (with phone number or street name):  [id number] [place], [place] - [address] AT [facility] [address]  [address] [place] [address]  Phone: [phone] Fax: [phone]  Hours: Not open 24 hours        Has the patient been seen for an appointment in the last year OR does the patient have an upcoming appointment? Yes.    Agent: Please be advised that RX refills may take up to 3 business days. We ask that you follow-up with your pharmacy.

## 2023-08-16 DIAGNOSIS — Z419 Encounter for procedure for purposes other than remedying health state, unspecified: Secondary | ICD-10-CM | POA: Diagnosis not present

## 2023-09-03 DIAGNOSIS — G4733 Obstructive sleep apnea (adult) (pediatric): Secondary | ICD-10-CM | POA: Diagnosis not present

## 2023-09-05 DIAGNOSIS — M5410 Radiculopathy, site unspecified: Secondary | ICD-10-CM | POA: Diagnosis not present

## 2023-09-15 DIAGNOSIS — Z419 Encounter for procedure for purposes other than remedying health state, unspecified: Secondary | ICD-10-CM | POA: Diagnosis not present

## 2023-09-26 DIAGNOSIS — M5416 Radiculopathy, lumbar region: Secondary | ICD-10-CM | POA: Diagnosis not present

## 2023-09-26 DIAGNOSIS — Z7689 Persons encountering health services in other specified circumstances: Secondary | ICD-10-CM | POA: Diagnosis not present

## 2023-09-27 ENCOUNTER — Ambulatory Visit: Payer: Self-pay | Admitting: *Deleted

## 2023-09-27 NOTE — Telephone Encounter (Signed)
Reason for Disposition  Systolic BP  >= 180 OR Diastolic >= 110  Answer Assessment - Initial Assessment Questions 1. BLOOD PRESSURE: "What is the blood pressure?" "Did you take at least two measurements 5 minutes apart?"     My BP is high.    I don't use seasonings and salt.   I've been eating some lately.   My amlodipine also ran out.   I take it in the mornings.   Arbesartan I've been breaking it in half and taking some morning and afternoon.   Yesterday I went to [facility] for shots in my back.   The dr said the steroid shot can make your BP high.   I don't usually have problems with my BP.   150/110 yesterday.   He asked if I still wanted the shot.    I said yes.   So everything went well.   I rested good last night.   This morning it's 191/110.        I've been off of amlodipine 3 days due to money issues.     I just got it back Monday.   I've been on it since body.    2. ONSET: "When did you take your blood pressure?"     This morning 3. HOW: "How did you take your blood pressure?" (e.g., automatic home BP monitor, visiting nurse)     BP monitor at home. 4. HISTORY: "Do you have a history of high blood pressure?"     Yes 5. MEDICINES: "Are you taking any medicines for blood pressure?" "Have you missed any doses recently?"     See above  6. OTHER SYMPTOMS: "Do you have any symptoms?" (e.g., blurred vision, chest pain, difficulty breathing, headache, weakness)     I'm not dizzy.    When I missed the days of the amlodipine I have a headache.   Tylenol helped the headaches.    7. PREGNANCY: "Is there any chance you are pregnant?" "When was your last menstrual period?"     N/A  Protocols used: Blood Pressure - High-A-AH

## 2023-09-27 NOTE — Telephone Encounter (Signed)
  Chief Complaint: Elevated BP Symptoms: Headaches Frequency: See notes.   Was off amlodipine for a few days due to financial issues.   Also been eating a lot of seasonings with salt in it. Pertinent Negatives: Patient denies dizziness.   He is back on his amlodipine now and his other BP medication too. Disposition: [] ED /[] Urgent Care (no appt availability in office) / [x] Appointment(In office/virtual)/ []  [facility]/ [] Home Care/ [] Refused Recommended Disposition /[] [facility]/ []  Follow-up with PCP Additional Notes: Appt made with Larae Grooms, NP for 10/01/2023 at 10:40.

## 2023-10-01 ENCOUNTER — Encounter: Payer: Self-pay | Admitting: Nurse Practitioner

## 2023-10-01 ENCOUNTER — Ambulatory Visit (INDEPENDENT_AMBULATORY_CARE_PROVIDER_SITE_OTHER): Payer: Medicaid Other | Admitting: Nurse Practitioner

## 2023-10-01 VITALS — BP 161/110 | HR 80 | Temp 97.5°F | Ht 69.0 in | Wt 296.0 lb

## 2023-10-01 DIAGNOSIS — I10 Essential (primary) hypertension: Secondary | ICD-10-CM | POA: Diagnosis not present

## 2023-10-01 DIAGNOSIS — Z23 Encounter for immunization: Secondary | ICD-10-CM | POA: Diagnosis not present

## 2023-10-01 MED ORDER — AMLODIPINE BESYLATE 10 MG PO TABS: 10.0000 mg | ORAL_TABLET | Freq: Every day | ORAL | 1 refills | Status: DC

## 2023-10-01 MED ORDER — CHLORTHALIDONE 25 MG PO TABS: 25.0000 mg | ORAL_TABLET | Freq: Every day | ORAL | 1 refills | Status: DC

## 2023-10-01 MED ORDER — OZEMPIC (0.25 OR 0.5 MG/DOSE) 2 MG/3ML ~~LOC~~ SOPN: 0.5000 mg | PEN_INJECTOR | SUBCUTANEOUS | 1 refills | Status: DC

## 2023-10-01 NOTE — Progress Notes (Signed)
BP (!) 161/110 (BP Location: Right Arm, Patient Position: Sitting, Cuff Size: Large)   Pulse 80   Temp (!) 97.5 F (36.4 C) (Oral)   Ht 5\' 9"  (1.753 m)   Wt 296 lb (134.3 kg)   SpO2 94%   BMI 43.71 kg/m    Subjective:    Patient ID: Anthony Lam, male    DOB: 04/29/1966, 57 y.o.   MRN: [id number]  HPI: Anthony Lam is a 57 y.o. male  Chief Complaint  Patient presents with   Hypertension   Headache   HYPERTENSION without Chronic Kidney Disease Hypertension status: uncontrolled  Satisfied with current treatment? yes Duration of hypertension: years BP monitoring frequency:  daily BP range: 170/100 BP medication side effects:  no Medication compliance: excellent compliance Previous BP meds:amlodipine and irbesartan (avapro) Aspirin: no Recurrent headaches: yes Visual changes: no Palpitations: no Dyspnea: no Chest pain: no Lower extremity edema: no Dizzy/lightheaded: no  Relevant past medical, surgical, family and social history reviewed and updated as indicated. Interim medical history since our last visit reviewed. Allergies and medications reviewed and updated.  Review of Systems  Eyes:  Negative for visual disturbance.  Respiratory:  Negative for shortness of breath.   Cardiovascular:  Negative for chest pain and leg swelling.  Neurological:  Positive for headaches. Negative for light-headedness.    Per HPI unless specifically indicated above     Objective:    BP (!) 161/110 (BP Location: Right Arm, Patient Position: Sitting, Cuff Size: Large)   Pulse 80   Temp (!) 97.5 F (36.4 C) (Oral)   Ht 5\' 9"  (1.753 m)   Wt 296 lb (134.3 kg)   SpO2 94%   BMI 43.71 kg/m   Wt Readings from Last 3 Encounters:  10/01/23 296 lb (134.3 kg)  07/26/23 290 lb (131.5 kg)  04/19/23 287 lb 12.8 oz (130.5 kg)    Physical Exam Vitals and nursing note reviewed.  Constitutional:      General: He is not in acute distress.    Appearance: Normal appearance. He is  well-developed. He is obese. He is not ill-appearing, toxic-appearing or diaphoretic.  HENT:     Head: Normocephalic.     Right Ear: External ear normal.     Left Ear: External ear normal.     Nose: Nose normal. No congestion or rhinorrhea.     Mouth/Throat:     Mouth: Mucous membranes are moist.  Eyes:     General:        Right eye: No discharge.        Left eye: No discharge.     Extraocular Movements: Extraocular movements intact.     Conjunctiva/sclera: Conjunctivae normal.     Pupils: Pupils are equal, round, and reactive to light.  Cardiovascular:     Rate and Rhythm: Normal rate and regular rhythm.     Heart sounds: No murmur heard. Pulmonary:     Effort: Pulmonary effort is normal. No respiratory distress.     Breath sounds: Normal breath sounds. No wheezing, rhonchi or rales.  Abdominal:     General: Abdomen is flat. Bowel sounds are normal.  Musculoskeletal:     Cervical back: Normal range of motion and neck supple.  Skin:    General: Skin is warm and dry.     Capillary Refill: Capillary refill takes less than 2 seconds.  Neurological:     General: No focal deficit present.     Mental Status: He is alert and  oriented to person, place, and time.  Psychiatric:        Mood and Affect: Mood normal.        Behavior: Behavior normal.        Thought Content: Thought content normal.        Judgment: Judgment normal.     Results for orders placed or performed in visit on 05/16/23  ECHOCARDIOGRAM COMPLETE   Collection Time: 05/16/23 10:43 AM  Result Value Ref Range   S' Lateral 2.50 cm   Area-P 1/2 3.91 cm2   Est EF 60 - 65%       Assessment & Plan:   Problem List Items Addressed This Visit       Cardiovascular and Mediastinum   Essential hypertension   Chronic. Not well controlled. Will add chlorthalidone 25mg  daily.  Side effects and benefits of medication discussed.  Continue with amlodipine and irbesartan. Follow up in 1 month.  Will check CMP at that  time.      Relevant Medications   chlorthalidone (HYGROTON) 25 MG tablet   amLODipine (NORVASC) 10 MG tablet   Other Visit Diagnoses       Need for influenza vaccination    -  Primary   Relevant Orders   Flu vaccine trivalent PF, 6mos and older(Flulaval,Afluria,Fluarix,Fluzone)        Follow up plan: Return in about 1 month (around 11/01/2023) for BP Check (check CMP).

## 2023-10-01 NOTE — Assessment & Plan Note (Signed)
Chronic. Not well controlled. Will add chlorthalidone 25mg  daily.  Side effects and benefits of medication discussed.  Continue with amlodipine and irbesartan. Follow up in 1 month.  Will check CMP at that time.

## 2023-10-02 ENCOUNTER — Other Ambulatory Visit: Payer: Self-pay

## 2023-10-02 DIAGNOSIS — E119 Type 2 diabetes mellitus without complications: Secondary | ICD-10-CM

## 2023-10-02 MED ORDER — ACCU-CHEK AVIVA PLUS W/DEVICE KIT: 1.0000 | PACK | Freq: Two times a day (BID) | 0 refills | Status: AC

## 2023-10-02 MED ORDER — ACCU-CHEK AVIVA PLUS VI STRP: 1.0000 | ORAL_STRIP | Freq: Two times a day (BID) | 3 refills | Status: AC

## 2023-10-02 MED ORDER — ACCU-CHEK SOFTCLIX LANCETS MISC
1.0000 | Freq: Two times a day (BID) | 3 refills | Status: AC
Start: 2023-10-02 — End: ?

## 2023-10-07 DIAGNOSIS — G4733 Obstructive sleep apnea (adult) (pediatric): Secondary | ICD-10-CM | POA: Diagnosis not present

## 2023-10-16 DIAGNOSIS — Z419 Encounter for procedure for purposes other than remedying health state, unspecified: Secondary | ICD-10-CM | POA: Diagnosis not present

## 2023-10-28 ENCOUNTER — Encounter: Payer: Self-pay | Admitting: Nurse Practitioner

## 2023-10-28 ENCOUNTER — Ambulatory Visit (INDEPENDENT_AMBULATORY_CARE_PROVIDER_SITE_OTHER): Payer: Medicaid Other | Admitting: Nurse Practitioner

## 2023-10-28 VITALS — BP 127/89 | HR 83 | Temp 97.9°F | Ht 69.0 in | Wt 289.2 lb

## 2023-10-28 DIAGNOSIS — E118 Type 2 diabetes mellitus with unspecified complications: Secondary | ICD-10-CM | POA: Diagnosis not present

## 2023-10-28 DIAGNOSIS — I129 Hypertensive chronic kidney disease with stage 1 through stage 4 chronic kidney disease, or unspecified chronic kidney disease: Secondary | ICD-10-CM

## 2023-10-28 DIAGNOSIS — E119 Type 2 diabetes mellitus without complications: Secondary | ICD-10-CM | POA: Diagnosis not present

## 2023-10-28 DIAGNOSIS — G4730 Sleep apnea, unspecified: Secondary | ICD-10-CM

## 2023-10-28 DIAGNOSIS — Z7985 Long-term (current) use of injectable non-insulin antidiabetic drugs: Secondary | ICD-10-CM | POA: Insufficient documentation

## 2023-10-28 DIAGNOSIS — Z0000 Encounter for general adult medical examination without abnormal findings: Secondary | ICD-10-CM

## 2023-10-28 DIAGNOSIS — N1831 Chronic kidney disease, stage 3a: Secondary | ICD-10-CM

## 2023-10-28 DIAGNOSIS — E7800 Pure hypercholesterolemia, unspecified: Secondary | ICD-10-CM

## 2023-10-28 DIAGNOSIS — I10 Essential (primary) hypertension: Secondary | ICD-10-CM | POA: Diagnosis not present

## 2023-10-28 LAB — MICROALBUMIN, URINE WAIVED
Creatinine, Urine Waived: 200 mg/dL (ref 10–300)
Microalb, Ur Waived: 80 mg/L — ABNORMAL HIGH (ref 0–19)
Microalb/Creat Ratio: <30 mg/g (ref <30)

## 2023-10-28 LAB — URINALYSIS, ROUTINE W REFLEX MICROSCOPIC
Bilirubin, UA: NEGATIVE
Glucose, UA: NEGATIVE
Ketones, UA: NEGATIVE
Leukocytes,UA: NEGATIVE
Nitrite, UA: NEGATIVE
Protein,UA: NEGATIVE
RBC, UA: NEGATIVE
Specific Gravity, UA: 1.025 (ref 1.005–1.030)
Urobilinogen, Ur: 0.2 mg/dL (ref 0.2–1.0)
pH, UA: 5.5 (ref 5.0–7.5)

## 2023-10-28 NOTE — Assessment & Plan Note (Signed)
Chronic. Continue with Crestor 5mg daily.  Labs ordered today.  Follow up in 6 months.  Call sooner if concerns arise.   

## 2023-10-28 NOTE — Assessment & Plan Note (Signed)
 Waking up with headaches. Will order new sleep study.  Will make recommendations based on results.

## 2023-10-28 NOTE — Assessment & Plan Note (Signed)
Chronic.  Controlled.  Continue with current medication regimen.  Microalbumin checked today.   Labs ordered today.  Return to clinic in 6 months for reevaluation.  Call sooner if concerns arise.

## 2023-10-28 NOTE — Assessment & Plan Note (Signed)
 Chronic.  Controlled.  Continue with current medication regimen.  Labs ordered today.  Return to clinic in 6 months for reevaluation.  Call sooner if concerns arise.  ? ?

## 2023-10-28 NOTE — Progress Notes (Signed)
 BP 127/89 (BP Location: Left Arm, Patient Position: Sitting, Cuff Size: Large)   Pulse 83   Temp 97.9 F (36.6 C) (Oral)   Ht 5' 9 (1.753 m)   Wt 289 lb 3.2 oz (131.2 kg)   SpO2 96%   BMI 42.71 kg/m    Subjective:    Patient ID: Anthony Lam, male    DOB: 1966-04-20, 58 y.o.   MRN: [id number]  HPI: Anthony Lam is a 58 y.o. male presenting on 10/28/2023 for comprehensive medical examination. Current medical complaints include:none  He currently lives with: Interim Problems from his last visit: no  HYPERTENSION Hypertension status: controlled  Satisfied with current treatment? no Duration of hypertension: years BP monitoring frequency:  weekly BP range: 130/80 BP medication side effects:  no Medication compliance: excellent compliance Previous BP meds:amlodipine , Chlorthalidone , and losartan  (cozaar ) Aspirin: no Recurrent headaches:yes Visual changes: no Palpitations: no Dyspnea: no Chest pain: no Lower extremity edema: no Dizzy/lightheaded: no  DIABETES Patient states he is doing well with Ozempic .  Would like me to show him how to use his glucose meter at our next visit. Hypoglycemic episodes:no Polydipsia/polyuria: no Visual disturbance: no Chest pain: no Paresthesias: no Glucose Monitoring: no  Accucheck frequency: Not Checking  Fasting glucose:  Post prandial:  Evening:  Before meals: Taking Insulin?: no  Long acting insulin:  Short acting insulin: Blood Pressure Monitoring: not checking Retinal Examination: Not up to Date Foot Exam: Up to Date Diabetic Education: Not Completed Pneumovax: Up to Date Influenza: Up to Date Aspirin: no  CHRONIC KIDNEY DISEASE CKD status: controlled Medications renally dose: no Previous renal evaluation: no Pneumovax:  Up to Date Influenza Vaccine:  Up to Date  SLEEP APNEA Sleep apnea status: controlled Duration: chronic Satisfied with current treatment?:  yes CPAP use:  yes Sleep quality with CPAP  use: good, average Treament compliance:poor compliance Last sleep study:  Treatments attempted:  Wakes feeling refreshed:  yes Daytime hypersomnolence:  no Fatigue:  yes Insomnia:  no Good sleep hygiene:  yes Difficulty falling asleep:  no Difficulty staying asleep:  no Snoring bothers bed partner:  no Observed apnea by bed partner: no Obesity:  yes Hypertension: yes  Pulmonary hypertension:  no Coronary artery disease:  no   Depression Screen done today and results listed below:     10/28/2023    1:45 PM 03/25/2023    2:50 PM 10/24/2022    2:01 PM 04/23/2022    3:11 PM 10/23/2021    2:44 PM  Depression screen PHQ 2/9  Decreased Interest 2 3 2  0 0  Down, Depressed, Hopeless 2 2 1 1  0  PHQ - 2 Score 4 5 3 1  0  Altered sleeping 2 2 3 1 1   Tired, decreased energy 2 2 3 1 1   Change in appetite 2 1 0 0 1  Feeling bad or failure about yourself  0 0 0 0 0  Trouble concentrating 2 1 2 1 1   Moving slowly or fidgety/restless 0 0 0 0 0  Suicidal thoughts 0 0 0 0 0  PHQ-9 Score 12 11 11 4 4   Difficult doing work/chores  Somewhat difficult Somewhat difficult Somewhat difficult Not difficult at all    The patient does not have a history of falls. I did complete a risk assessment for falls. A plan of care for falls was documented.   Past Medical History:  Past Medical History:  Diagnosis Date   Chronic kidney disease    stage 3a  Diabetes mellitus without complication (HCC)    Hypertension    Migraines    Seizures (HCC)    Sleep apnea    TB (tuberculosis), treated 2009    Surgical History:  Past Surgical History:  Procedure Laterality Date   APPENDECTOMY     COLONOSCOPY WITH PROPOFOL  N/A 01/26/2020   Procedure: COLONOSCOPY WITH PROPOFOL ;  Surgeon: Therisa Bi, MD;  Location: [facility] ENDOSCOPY;  Service: Gastroenterology;  Laterality: N/A;   COLONOSCOPY WITH PROPOFOL  N/A 01/28/2023   Procedure: COLONOSCOPY WITH PROPOFOL ;  Surgeon: Therisa Bi, MD;  Location: [facility] ENDOSCOPY;   Service: Gastroenterology;  Laterality: N/A;  Patient needs a late morning appointment for Colonoscopy due to transportation ([facility])    Medications:  Current Outpatient Medications on File Prior to Visit  Medication Sig   Accu-Chek Softclix Lancets lancets 1 each by Other route 2 (two) times daily. Use as instructed   amLODipine  (NORVASC ) 10 MG tablet Take 1 tablet (10 mg total) by mouth daily.   atorvastatin (LIPITOR) 10 MG tablet Take 1 tablet by mouth daily.   Blood Glucose Monitoring Suppl (ACCU-CHEK AVIVA PLUS) w/Device KIT 1 each by Does not apply route 2 (two) times daily.   Blood Glucose Monitoring Suppl (BLOOD GLUCOSE MONITOR SYSTEM) w/Device KIT USE AS DIRECTED.   chlorthalidone  (HYGROTON ) 25 MG tablet Take 1 tablet (25 mg total) by mouth daily.   gabapentin  (NEURONTIN ) 100 MG capsule TAKE ONE CAPSULE BY MOUTH 3 TIMES A DAY   glucose blood (ACCU-CHEK AVIVA PLUS) test strip 1 each by Other route 2 (two) times daily. Use as instructed   irbesartan  (AVAPRO ) 300 MG tablet Take 1 tablet (300 mg total) by mouth daily.   metoprolol  succinate (TOPROL  XL) 50 MG 24 hr tablet Take 1 tablet (50 mg total) by mouth daily.   rosuvastatin  (CRESTOR ) 5 MG tablet Take 1 tablet (5 mg total) by mouth daily.   Semaglutide ,0.25 or 0.5MG /DOS, (OZEMPIC , 0.25 OR 0.5 MG/DOSE,) 2 MG/3ML SOPN Inject 0.5 mg into the skin once a week.   fluticasone  (FLONASE ) 50 MCG/ACT nasal spray SPRAY 1 SPRAY IN EACH NOSTRIL ONCE DAILY.   No current facility-administered medications on file prior to visit.    Allergies:  Allergies  Allergen Reactions   Lactose Intolerance (Gi) Other (See Comments)    GI Upset   Metformin  And Related Diarrhea   Peanut-Containing Drug Products     Social History:  Social History   Socioeconomic History   Marital status: Single    Spouse name: Not on file   Number of children: 2   Years of education: Not on file   Highest education level: 12th grade  Occupational History   Not on  file  Tobacco Use   Smoking status: Some Days    Types: Cigarettes   Smokeless tobacco: Never   Tobacco comments:    The only time he smokes is when he has a beer 1 cigarette. 1 pack of cigarettes lasts ~2 weeks.  Vaping Use   Vaping status: Never Used  Substance and Sexual Activity   Alcohol use: Yes    Alcohol/week: 1.0 standard drink of alcohol    Types: 1 Cans of beer per week    Comment: 1-2 beers a week   Drug use: Never   Sexual activity: Yes  Other Topics Concern   Not on file  Social History Narrative   Not on file   Social Drivers of Health   Financial Resource Strain: High Risk (07/10/2018)   Overall Financial Resource  Strain (CARDIA)    Difficulty of Paying Living Expenses: Very hard  Food Insecurity: No Food Insecurity (04/12/2021)   Hunger Vital Sign    Worried About Running Out of Food in the Last Year: Never true    Ran Out of Food in the Last Year: Never true  Transportation Needs: No Transportation Needs (04/12/2021)   PRAPARE - Administrator, Civil Service (Medical): No    Lack of Transportation (Non-Medical): No  Physical Activity: Not on file  Stress: Not on file  Social Connections: Not on file  Intimate Partner Violence: Not on file   Social History   Tobacco Use  Smoking Status Some Days   Types: Cigarettes  Smokeless Tobacco Never  Tobacco Comments   The only time he smokes is when he has a beer 1 cigarette. 1 pack of cigarettes lasts ~2 weeks.   Social History   Substance and Sexual Activity  Alcohol Use Yes   Alcohol/week: 1.0 standard drink of alcohol   Types: 1 Cans of beer per week   Comment: 1-2 beers a week    Family History:  Family History  Problem Relation Age of Onset   Hypertension Mother    Constipation Father        chronic   Alzheimer's disease Father    Deep vein thrombosis Sister    Hypertension Maternal Grandmother     Past medical history, surgical history, medications, allergies, family history  and social history reviewed with patient today and changes made to appropriate areas of the chart.   Review of Systems  HENT:         Denies vision changes.  Eyes:  Negative for blurred vision and double vision.  Respiratory:  Negative for shortness of breath.   Cardiovascular:  Negative for chest pain, palpitations and leg swelling.  Neurological:  Positive for headaches. Negative for dizziness and tingling.  Endo/Heme/Allergies:  Negative for polydipsia.       Denies Polyuria   All other ROS negative except what is listed above and in the HPI.      Objective:    BP 127/89 (BP Location: Left Arm, Patient Position: Sitting, Cuff Size: Large)   Pulse 83   Temp 97.9 F (36.6 C) (Oral)   Ht 5' 9 (1.753 m)   Wt 289 lb 3.2 oz (131.2 kg)   SpO2 96%   BMI 42.71 kg/m   Wt Readings from Last 3 Encounters:  10/28/23 289 lb 3.2 oz (131.2 kg)  10/01/23 296 lb (134.3 kg)  07/26/23 290 lb (131.5 kg)    Physical Exam Vitals and nursing note reviewed.  Constitutional:      General: He is not in acute distress.    Appearance: Normal appearance. He is obese. He is not ill-appearing, toxic-appearing or diaphoretic.  HENT:     Head: Normocephalic.     Right Ear: Tympanic membrane, ear canal and external ear normal.     Left Ear: Tympanic membrane, ear canal and external ear normal.     Nose: Nose normal. No congestion or rhinorrhea.     Mouth/Throat:     Mouth: Mucous membranes are moist.  Eyes:     General:        Right eye: No discharge.        Left eye: No discharge.     Extraocular Movements: Extraocular movements intact.     Conjunctiva/sclera: Conjunctivae normal.     Pupils: Pupils are equal, round, and reactive to light.  Cardiovascular:     Rate and Rhythm: Normal rate and regular rhythm.     Heart sounds: No murmur heard. Pulmonary:     Effort: Pulmonary effort is normal. No respiratory distress.     Breath sounds: Normal breath sounds. No wheezing, rhonchi or rales.   Abdominal:     General: Abdomen is flat. Bowel sounds are normal. There is no distension.     Palpations: Abdomen is soft.     Tenderness: There is no abdominal tenderness. There is no guarding.  Musculoskeletal:     Cervical back: Normal range of motion and neck supple.  Skin:    General: Skin is warm and dry.     Capillary Refill: Capillary refill takes less than 2 seconds.  Neurological:     General: No focal deficit present.     Mental Status: He is alert and oriented to person, place, and time.     Cranial Nerves: No cranial nerve deficit.     Motor: No weakness.     Deep Tendon Reflexes: Reflexes normal.  Psychiatric:        Mood and Affect: Mood normal.        Behavior: Behavior normal.        Thought Content: Thought content normal.        Judgment: Judgment normal.     Results for orders placed or performed in visit on 05/16/23  ECHOCARDIOGRAM COMPLETE   Collection Time: 05/16/23 10:43 AM  Result Value Ref Range   S' Lateral 2.50 cm   Area-P 1/2 3.91 cm2   Est EF 60 - 65%       Assessment & Plan:   Problem List Items Addressed This Visit       Cardiovascular and Mediastinum   Essential hypertension   Chronic.  Controlled.  Continue with current medication regimen.  Labs ordered today.  Return to clinic in 6 months for reevaluation.  Call sooner if concerns arise.          Respiratory   Sleep apnea   Waking up with headaches. Will order new sleep study.  Will make recommendations based on results.       Relevant Orders   Ambulatory referral to Sleep Studies     Endocrine   Controlled diabetes mellitus type 2 with complications (HCC)   Chronic.  Controlled.  Continue with current medication regimen of Ozempic  0.5mg  weekly.  Microalbumin checked at visit today.  Labs ordered today.  Return to clinic in 6 months for reevaluation.  Call sooner if concerns arise.       Relevant Orders   Hemoglobin A1c   Microalbumin, Urine Waived     Genitourinary    Benign hypertensive renal disease   Chronic.  Controlled.  Continue with current medication regimen.  Microalbumin checked today.  Labs ordered today.  Return to clinic in 6 months for reevaluation.  Call sooner if concerns arise.        CKD (chronic kidney disease) stage 3, GFR 30-59 ml/min (HCC)   Labs ordered at visit today.  Will make recommendations based on lab results.         Other   Hypercholesteremia   Chronic. Continue with Crestor  5mg  daily.  Labs ordered today.  Follow up in 6 months.  Call sooner if concerns arise.        Relevant Orders   Lipid panel   Other Visit Diagnoses       Annual physical exam    -  Primary   Health maintenance reviewed during visit today.  Labs ordered.  Vaccines reviewed.  Colonoscopy up to date.   Relevant Orders   TSH   PSA   Lipid panel   CBC with Differential/Platelet   Comprehensive metabolic panel   Urinalysis, Routine w reflex microscopic   Hemoglobin A1c     Diabetes mellitus treated with injections of non-insulin medication (HCC)            Discussed aspirin prophylaxis for myocardial infarction prevention and decision was it was not indicated  LABORATORY TESTING:  Health maintenance labs ordered today as discussed above.   The natural history of prostate cancer and ongoing controversy regarding screening and potential treatment outcomes of prostate cancer has been discussed with the patient. The meaning of a false positive PSA and a false negative PSA has been discussed. He indicates understanding of the limitations of this screening test and wishes to proceed with screening PSA testing.   IMMUNIZATIONS:   - Tdap: Tetanus vaccination status reviewed: last tetanus booster within 10 years. - Influenza: up to date - Pneumovax: up to date - Prevnar: Not applicable - COVID: Refused - HPV: Not applicable - Shingrix vaccine: will get at next visit.  SCREENING: - Colonoscopy: Up to date  Discussed with patient purpose of  the colonoscopy is to detect colon cancer at curable precancerous or early stages   - AAA Screening: Not applicable  -Hearing Test: Not applicable  -Spirometry: Not applicable   PATIENT COUNSELING:    Sexuality: Discussed sexually transmitted diseases, partner selection, use of condoms, avoidance of unintended pregnancy  and contraceptive alternatives.   Advised to avoid cigarette smoking.  I discussed with the patient that most people either abstain from alcohol or drink within safe limits (<=14/week and <=4 drinks/occasion for males, <=7/weeks and <= 3 drinks/occasion for females) and that the risk for alcohol disorders and other health effects rises proportionally with the number of drinks per week and how often a drinker exceeds daily limits.  Discussed cessation/primary prevention of drug use and availability of treatment for abuse.   Diet: Encouraged to adjust caloric intake to maintain  or achieve ideal body weight, to reduce intake of dietary saturated fat and total fat, to limit sodium intake by avoiding high sodium foods and not adding table salt, and to maintain adequate dietary potassium and calcium  preferably from fresh fruits, vegetables, and low-fat dairy products.    stressed the importance of regular exercise  Injury prevention: Discussed safety belts, safety helmets, smoke detector, smoking near bedding or upholstery.   Dental health: Discussed importance of regular tooth brushing, flossing, and dental visits.   Follow up plan: NEXT PREVENTATIVE PHYSICAL DUE IN 1 YEAR. No follow-ups on file.

## 2023-10-28 NOTE — Assessment & Plan Note (Signed)
 Chronic.  Controlled.  Continue with current medication regimen of Ozempic 0.5mg  weekly.  Microalbumin checked at visit today.  Labs ordered today.  Return to clinic in 6 months for reevaluation.  Call sooner if concerns arise.

## 2023-10-28 NOTE — Assessment & Plan Note (Signed)
 Labs ordered at visit today.  Will make recommendations based on lab results.

## 2023-10-29 LAB — COMPREHENSIVE METABOLIC PANEL
ALT: 15 [IU]/L (ref 0–44)
AST: 15 [IU]/L (ref 0–40)
Albumin: 4.8 g/dL (ref 3.8–4.9)
Alkaline Phosphatase: 81 [IU]/L (ref 44–121)
BUN/Creatinine Ratio: 15 (ref 9–20)
BUN: 30 mg/dL — ABNORMAL HIGH (ref 6–24)
Bilirubin Total: 0.5 mg/dL (ref 0.0–1.2)
CO2: 21 mmol/L (ref 20–29)
Calcium: 10.4 mg/dL — ABNORMAL HIGH (ref 8.7–10.2)
Chloride: 97 mmol/L (ref 96–106)
Creatinine, Ser: 1.98 mg/dL — ABNORMAL HIGH (ref 0.76–1.27)
Globulin, Total: 3.3 g/dL (ref 1.5–4.5)
Glucose: 91 mg/dL (ref 70–99)
Potassium: 3.9 mmol/L (ref 3.5–5.2)
Sodium: 140 mmol/L (ref 134–144)
Total Protein: 8.1 g/dL (ref 6.0–8.5)
eGFR: 38 mL/min/{1.73_m2} — ABNORMAL LOW (ref >59)

## 2023-10-29 LAB — CBC WITH DIFFERENTIAL/PLATELET
Basophils Absolute: 0 10*3/uL (ref 0.0–0.2)
Basos: 0 %
EOS (ABSOLUTE): 0.2 10*3/uL (ref 0.0–0.4)
Eos: 2 %
Hematocrit: 50.1 % (ref 37.5–51.0)
Hemoglobin: 16.4 g/dL (ref 13.0–17.7)
Immature Grans (Abs): 0 10*3/uL (ref 0.0–0.1)
Immature Granulocytes: 0 %
Lymphocytes Absolute: 2 10*3/uL (ref 0.7–3.1)
Lymphs: 25 %
MCH: 27.6 pg (ref 26.6–33.0)
MCHC: 32.7 g/dL (ref 31.5–35.7)
MCV: 84 fL (ref 79–97)
Monocytes Absolute: 0.6 10*3/uL (ref 0.1–0.9)
Monocytes: 8 %
Neutrophils Absolute: 5.2 10*3/uL (ref 1.4–7.0)
Neutrophils: 65 %
Platelets: 335 10*3/uL (ref 150–450)
RBC: 5.95 x10E6/uL — ABNORMAL HIGH (ref 4.14–5.80)
RDW: 15.6 % — ABNORMAL HIGH (ref 11.6–15.4)
WBC: 8 10*3/uL (ref 3.4–10.8)

## 2023-10-29 LAB — LIPID PANEL
Chol/HDL Ratio: 5 {ratio} (ref 0.0–5.0)
Cholesterol, Total: 204 mg/dL — ABNORMAL HIGH (ref 100–199)
HDL: 41 mg/dL (ref >39)
LDL Chol Calc (NIH): 129 mg/dL — ABNORMAL HIGH (ref 0–99)
Triglycerides: 189 mg/dL — ABNORMAL HIGH (ref 0–149)
VLDL Cholesterol Cal: 34 mg/dL (ref 5–40)

## 2023-10-29 LAB — TSH: TSH: 1.73 u[IU]/mL (ref 0.450–4.500)

## 2023-10-29 LAB — PSA: Prostate Specific Ag, Serum: 0.8 ng/mL (ref 0.0–4.0)

## 2023-10-29 LAB — HEMOGLOBIN A1C
Est. average glucose Bld gHb Est-mCnc: 126 mg/dL
Hgb A1c MFr Bld: 6 % — ABNORMAL HIGH (ref 4.8–5.6)

## 2023-10-29 NOTE — Addendum Note (Signed)
 Addended by: Larae Grooms on: 10/29/2023 08:09 AM   Modules accepted: Orders

## 2023-10-30 DIAGNOSIS — E11319 Type 2 diabetes mellitus with unspecified diabetic retinopathy without macular edema: Secondary | ICD-10-CM | POA: Diagnosis not present

## 2023-10-30 DIAGNOSIS — G4733 Obstructive sleep apnea (adult) (pediatric): Secondary | ICD-10-CM | POA: Diagnosis not present

## 2023-10-30 DIAGNOSIS — Z87891 Personal history of nicotine dependence: Secondary | ICD-10-CM | POA: Diagnosis not present

## 2023-10-30 DIAGNOSIS — F320 Major depressive disorder, single episode, mild: Secondary | ICD-10-CM | POA: Diagnosis not present

## 2023-10-30 DIAGNOSIS — E1151 Type 2 diabetes mellitus with diabetic peripheral angiopathy without gangrene: Secondary | ICD-10-CM | POA: Diagnosis not present

## 2023-10-30 DIAGNOSIS — E1122 Type 2 diabetes mellitus with diabetic chronic kidney disease: Secondary | ICD-10-CM | POA: Diagnosis not present

## 2023-10-30 DIAGNOSIS — F419 Anxiety disorder, unspecified: Secondary | ICD-10-CM | POA: Diagnosis not present

## 2023-10-30 DIAGNOSIS — Z008 Encounter for other general examination: Secondary | ICD-10-CM | POA: Diagnosis not present

## 2023-10-30 DIAGNOSIS — N182 Chronic kidney disease, stage 2 (mild): Secondary | ICD-10-CM | POA: Diagnosis not present

## 2023-10-30 DIAGNOSIS — Z8249 Family history of ischemic heart disease and other diseases of the circulatory system: Secondary | ICD-10-CM | POA: Diagnosis not present

## 2023-10-30 DIAGNOSIS — E785 Hyperlipidemia, unspecified: Secondary | ICD-10-CM | POA: Diagnosis not present

## 2023-11-08 ENCOUNTER — Telehealth: Payer: Self-pay | Admitting: Nurse Practitioner

## 2023-11-08 NOTE — Telephone Encounter (Signed)
Copied from CRM [id number]. Topic: General - Inquiry >> Nov 08, 2023 10:48 AM Clide Dales wrote: Patient states that he needs new CPAP supplies and the supply company needs to have the ok from his pcp. [facility] Fax [phone]

## 2023-11-11 NOTE — Telephone Encounter (Signed)
Form was received from [facility] regarding this an ordering supplies. Form was signed by provider and faxed back to [facility].

## 2023-11-13 ENCOUNTER — Other Ambulatory Visit: Payer: Medicaid Other

## 2023-11-13 ENCOUNTER — Ambulatory Visit: Payer: Medicaid Other | Admitting: Nurse Practitioner

## 2023-11-13 DIAGNOSIS — N1831 Chronic kidney disease, stage 3a: Secondary | ICD-10-CM

## 2023-11-13 DIAGNOSIS — G4733 Obstructive sleep apnea (adult) (pediatric): Secondary | ICD-10-CM | POA: Diagnosis not present

## 2023-11-14 ENCOUNTER — Encounter: Payer: Self-pay | Admitting: Nurse Practitioner

## 2023-11-14 DIAGNOSIS — M5410 Radiculopathy, site unspecified: Secondary | ICD-10-CM | POA: Diagnosis not present

## 2023-11-14 LAB — COMPREHENSIVE METABOLIC PANEL
ALT: 12 [IU]/L (ref 0–44)
AST: 15 [IU]/L (ref 0–40)
Albumin: 4.5 g/dL (ref 3.8–4.9)
Alkaline Phosphatase: 80 [IU]/L (ref 44–121)
BUN/Creatinine Ratio: 12 (ref 9–20)
BUN: 22 mg/dL (ref 6–24)
Bilirubin Total: 0.6 mg/dL (ref 0.0–1.2)
CO2: 26 mmol/L (ref 20–29)
Calcium: 9.7 mg/dL (ref 8.7–10.2)
Chloride: 97 mmol/L (ref 96–106)
Creatinine, Ser: 1.85 mg/dL — ABNORMAL HIGH (ref 0.76–1.27)
Globulin, Total: 3 g/dL (ref 1.5–4.5)
Glucose: 92 mg/dL (ref 70–99)
Potassium: 3.7 mmol/L (ref 3.5–5.2)
Sodium: 140 mmol/L (ref 134–144)
Total Protein: 7.5 g/dL (ref 6.0–8.5)
eGFR: 42 mL/min/{1.73_m2} — ABNORMAL LOW (ref >59)

## 2023-11-16 DIAGNOSIS — Z419 Encounter for procedure for purposes other than remedying health state, unspecified: Secondary | ICD-10-CM | POA: Diagnosis not present

## 2023-11-18 ENCOUNTER — Telehealth: Payer: Self-pay | Admitting: Nurse Practitioner

## 2023-11-18 ENCOUNTER — Ambulatory Visit: Payer: Self-pay | Admitting: *Deleted

## 2023-11-18 NOTE — Telephone Encounter (Signed)
Routing to provider to advise on medication.

## 2023-11-18 NOTE — Telephone Encounter (Signed)
  Chief Complaint: Medication question: Chlorthalidone  Symptoms: patient is concerned about effect on HgbA1c numbers- he states he read not good for diabatic/pre diabetic  Disposition: [] ED /[] Urgent Care (no appt availability in office) / [] Appointment(In office/virtual)/ []  [facility]/ [] Home Care/ [] Refused Recommended Disposition /[] [facility]/ [x]  Follow-up with PCP Additional Notes: Patient would like an alternative Rx. Patient also had question about his labs- reviewed also.

## 2023-11-18 NOTE — Telephone Encounter (Signed)
The patient returned the call from [place] stating he still is not comfortable taking the chlorthalidone (HYGROTON) 25 MG tablet as it says on the bottle not to take with Diabetes or pre diabetes. He is comfortable with the other bp meds as it states ok to take being pre diabetic for those. He said he also screen shotted it and is trying to upload lthat in my chart. If not he will bring in with him for his next appt next month. Please assist patient further

## 2023-11-18 NOTE — Telephone Encounter (Signed)
Reason for Disposition  [1] Caller has URGENT medicine question about med that PCP or specialist prescribed AND [2] triager unable to answer question  Answer Assessment - Initial Assessment Questions 1. NAME of MEDICINE: "What medicine(s) are you calling about?"     chlorthalidone (HYGROTON) 25 MG tablet 2. QUESTION: "What is your question?" (e.g., double dose of medicine, side effect)     Patient is concerned about the medication side effect- patient is concerned that this medication not good for pre diabetes per paperwork. Patient is also have slight headache- usually when excited/involved in conversation 3. PRESCRIBER: "Who prescribed the medicine?" Reason: if prescribed by specialist, call should be referred to that group.     PCP Patient states he stopped the medication ( chlorthalidone) on Saturday. He would like option  Protocols used: Medication Question Call-A-AH

## 2023-11-18 NOTE — Telephone Encounter (Signed)
Called and LVM notifying patient of providers message. Asked for patient to please call back with any concerns or questions.

## 2023-11-18 NOTE — Telephone Encounter (Signed)
Chlorthalidone is appropriate to take with diabetes.  It is a diabetic and won't have an effect on his blood sugar numbers.

## 2023-11-19 MED ORDER — FUROSEMIDE 20 MG PO TABS: 20.0000 mg | ORAL_TABLET | Freq: Every day | ORAL | 0 refills | Status: DC

## 2023-11-19 NOTE — Telephone Encounter (Signed)
Called and notified patient of medication change.  

## 2023-11-19 NOTE — Telephone Encounter (Signed)
Furosemide sent to the pharmacy.

## 2023-12-14 DIAGNOSIS — Z419 Encounter for procedure for purposes other than remedying health state, unspecified: Secondary | ICD-10-CM | POA: Diagnosis not present

## 2024-01-01 ENCOUNTER — Ambulatory Visit: Payer: Medicaid Other | Admitting: Nurse Practitioner

## 2024-01-03 ENCOUNTER — Ambulatory Visit: Payer: Medicaid Other | Admitting: Cardiology

## 2024-01-07 ENCOUNTER — Ambulatory Visit: Admitting: Nurse Practitioner

## 2024-01-07 ENCOUNTER — Encounter: Payer: Self-pay | Admitting: Nurse Practitioner

## 2024-01-07 VITALS — BP 127/89 | HR 79 | Ht 64.0 in | Wt 290.4 lb

## 2024-01-07 DIAGNOSIS — N1831 Chronic kidney disease, stage 3a: Secondary | ICD-10-CM

## 2024-01-07 DIAGNOSIS — Z7985 Long-term (current) use of injectable non-insulin antidiabetic drugs: Secondary | ICD-10-CM | POA: Diagnosis not present

## 2024-01-07 DIAGNOSIS — I10 Essential (primary) hypertension: Secondary | ICD-10-CM

## 2024-01-07 DIAGNOSIS — E119 Type 2 diabetes mellitus without complications: Secondary | ICD-10-CM | POA: Diagnosis not present

## 2024-01-07 DIAGNOSIS — Z7689 Persons encountering health services in other specified circumstances: Secondary | ICD-10-CM | POA: Diagnosis not present

## 2024-01-07 DIAGNOSIS — I129 Hypertensive chronic kidney disease with stage 1 through stage 4 chronic kidney disease, or unspecified chronic kidney disease: Secondary | ICD-10-CM | POA: Diagnosis not present

## 2024-01-07 NOTE — Assessment & Plan Note (Signed)
 Labs ordered at visit today.  Will make recommendations based on lab results. Needs to make follow up appt with Nephrology.

## 2024-01-07 NOTE — Assessment & Plan Note (Signed)
 Chronic.  Controlled.  Continue with current medication regimen.  Increase Ozempic to 0.5mg  weekl.  Labs ordered today.  Return to clinic in 6 months for reevaluation.  Call sooner if concerns arise.

## 2024-01-07 NOTE — Progress Notes (Signed)
 BP 127/89   Pulse 79   Ht 5\' 4"  (1.626 m)   Wt 290 lb 6.4 oz (131.7 kg)   SpO2 96%   BMI 49.85 kg/m    Subjective:    Patient ID: Anthony Lam, male    DOB: 07-06-1966, 58 y.o.   MRN: [id number]  HPI: Anthony Lam is a 58 y.o. male  Chief Complaint  Patient presents with   Hypertension   Hyperlipidemia   Diabetes   HYPERTENSION Will be seeing Cardiology next month.   Hypertension status: controlled  Satisfied with current treatment? no Duration of hypertension: years BP monitoring frequency:  weekly BP range: 127/80-90 BP medication side effects:  no Medication compliance: excellent compliance Previous BP meds:amlodipine, furosemide, and losartan (cozaar) Aspirin: no Recurrent headaches: no Visual changes: no Palpitations: no Dyspnea: no Chest pain: no Lower extremity edema: no Dizzy/lightheaded: no  DIABETES Patient states he is doing well with Ozempic.  Has been using 0.25mg  weekly. Hypoglycemic episodes:no Polydipsia/polyuria: no Visual disturbance: no Chest pain: no Paresthesias: no Glucose Monitoring: no  Accucheck frequency: Not Checking  Fasting glucose:  Post prandial:  Evening:  Before meals: Taking Insulin?: no  Long acting insulin:  Short acting insulin: Blood Pressure Monitoring: not checking Retinal Examination: Not up to Date Foot Exam: Up to Date Diabetic Education: Not Completed Pneumovax: Up to Date Influenza: Up to Date Aspirin: no  CHRONIC KIDNEY DISEASE Needs to make follow up appt with Nephrology.   Secondary to uncontrolled HTN CKD status: controlled Medications renally dose: no Previous renal evaluation: no Pneumovax:  Up to Date Influenza Vaccine:  Up to Date  Relevant past medical, surgical, family and social history reviewed and updated as indicated. Interim medical history since our last visit reviewed. Allergies and medications reviewed and updated.  Review of Systems  Eyes:  Negative for visual  disturbance.  Respiratory:  Negative for chest tightness and shortness of breath.   Cardiovascular:  Negative for chest pain, palpitations and leg swelling.  Neurological:  Negative for dizziness, light-headedness and headaches.    Per HPI unless specifically indicated above     Objective:    BP 127/89   Pulse 79   Ht 5\' 4"  (1.626 m)   Wt 290 lb 6.4 oz (131.7 kg)   SpO2 96%   BMI 49.85 kg/m   Wt Readings from Last 3 Encounters:  01/07/24 290 lb 6.4 oz (131.7 kg)  10/28/23 289 lb 3.2 oz (131.2 kg)  10/01/23 296 lb (134.3 kg)    Physical Exam Vitals and nursing note reviewed.  Constitutional:      General: He is not in acute distress.    Appearance: Normal appearance. He is not ill-appearing, toxic-appearing or diaphoretic.  HENT:     Head: Normocephalic.     Right Ear: External ear normal.     Left Ear: External ear normal.     Nose: Nose normal. No congestion or rhinorrhea.     Mouth/Throat:     Mouth: Mucous membranes are moist.  Eyes:     General:        Right eye: No discharge.        Left eye: No discharge.     Extraocular Movements: Extraocular movements intact.     Conjunctiva/sclera: Conjunctivae normal.     Pupils: Pupils are equal, round, and reactive to light.  Cardiovascular:     Rate and Rhythm: Normal rate and regular rhythm.     Heart sounds: No murmur heard. Pulmonary:  Effort: Pulmonary effort is normal. No respiratory distress.     Breath sounds: Normal breath sounds. No wheezing, rhonchi or rales.  Abdominal:     General: Abdomen is flat. Bowel sounds are normal.  Musculoskeletal:     Cervical back: Normal range of motion and neck supple.  Skin:    General: Skin is warm and dry.     Capillary Refill: Capillary refill takes less than 2 seconds.  Neurological:     General: No focal deficit present.     Mental Status: He is alert and oriented to person, place, and time.  Psychiatric:        Mood and Affect: Mood normal.        Behavior:  Behavior normal.        Thought Content: Thought content normal.        Judgment: Judgment normal.     Results for orders placed or performed in visit on 11/13/23  Comp Met (CMET)   Collection Time: 11/13/23 11:03 AM  Result Value Ref Range   Glucose 92 70 - 99 mg/dL   BUN 22 6 - 24 mg/dL   Creatinine, Ser 9.60 (H) 0.76 - 1.27 mg/dL   eGFR 42 (L) >45 WU/JWJ/1.91   BUN/Creatinine Ratio 12 9 - 20   Sodium 140 134 - 144 mmol/L   Potassium 3.7 3.5 - 5.2 mmol/L   Chloride 97 96 - 106 mmol/L   CO2 26 20 - 29 mmol/L   Calcium 9.7 8.7 - 10.2 mg/dL   Total Protein 7.5 6.0 - 8.5 g/dL   Albumin 4.5 3.8 - 4.9 g/dL   Globulin, Total 3.0 1.5 - 4.5 g/dL   Bilirubin Total 0.6 0.0 - 1.2 mg/dL   Alkaline Phosphatase 80 44 - 121 IU/L   AST 15 0 - 40 IU/L   ALT 12 0 - 44 IU/L      Assessment & Plan:   Problem List Items Addressed This Visit       Cardiovascular and Mediastinum   RESOLVED: Essential hypertension - Primary     Endocrine   Diabetes mellitus treated with injections of non-insulin medication (HCC)   Chronic.  Controlled.  Continue with current medication regimen.  Increase Ozempic to 0.5mg  weekl.  Labs ordered today.  Return to clinic in 6 months for reevaluation.  Call sooner if concerns arise.        Relevant Orders   Comp Met (CMET)   HgB A1c     Genitourinary   Benign hypertensive renal disease   Chronic.  Controlled.  Continue with current medication regimen.  Blood pressure improved at home with Lasix.  Microalbumin up to date.  Labs ordered today.  Return to clinic in 6 months for reevaluation.  Call sooner if concerns arise.       CKD (chronic kidney disease) stage 3, GFR 30-59 ml/min (HCC)   Labs ordered at visit today.  Will make recommendations based on lab results. Needs to make follow up appt with Nephrology.      Relevant Orders   CBC w/Diff     Follow up plan: Return in about 6 months (around 07/09/2024) for HTN, HLD, DM2 FU.

## 2024-01-07 NOTE — Assessment & Plan Note (Addendum)
 Chronic.  Controlled.  Continue with current medication regimen.  Blood pressure improved at home with Lasix.  Microalbumin up to date.  Labs ordered today.  Return to clinic in 6 months for reevaluation.  Call sooner if concerns arise.

## 2024-01-08 ENCOUNTER — Encounter: Payer: Self-pay | Admitting: Nurse Practitioner

## 2024-01-08 LAB — CBC WITH DIFFERENTIAL/PLATELET
Basophils Absolute: 0 10*3/uL (ref 0.0–0.2)
Basos: 0 %
EOS (ABSOLUTE): 0.2 10*3/uL (ref 0.0–0.4)
Eos: 3 %
Hematocrit: 46.6 % (ref 37.5–51.0)
Hemoglobin: 15 g/dL (ref 13.0–17.7)
Immature Grans (Abs): 0 10*3/uL (ref 0.0–0.1)
Immature Granulocytes: 0 %
Lymphocytes Absolute: 1.9 10*3/uL (ref 0.7–3.1)
Lymphs: 25 %
MCH: 27 pg (ref 26.6–33.0)
MCHC: 32.2 g/dL (ref 31.5–35.7)
MCV: 84 fL (ref 79–97)
Monocytes Absolute: 0.5 10*3/uL (ref 0.1–0.9)
Monocytes: 7 %
Neutrophils Absolute: 4.9 10*3/uL (ref 1.4–7.0)
Neutrophils: 65 %
Platelets: 329 10*3/uL (ref 150–450)
RBC: 5.56 x10E6/uL (ref 4.14–5.80)
RDW: 16 % — ABNORMAL HIGH (ref 11.6–15.4)
WBC: 7.6 10*3/uL (ref 3.4–10.8)

## 2024-01-08 LAB — COMPREHENSIVE METABOLIC PANEL
ALT: 15 IU/L (ref 0–44)
AST: 14 IU/L (ref 0–40)
Albumin: 4.4 g/dL (ref 3.8–4.9)
Alkaline Phosphatase: 84 IU/L (ref 44–121)
BUN/Creatinine Ratio: 8 — ABNORMAL LOW (ref 9–20)
BUN: 13 mg/dL (ref 6–24)
Bilirubin Total: 0.3 mg/dL (ref 0.0–1.2)
CO2: 23 mmol/L (ref 20–29)
Calcium: 10.1 mg/dL (ref 8.7–10.2)
Chloride: 102 mmol/L (ref 96–106)
Creatinine, Ser: 1.56 mg/dL — ABNORMAL HIGH (ref 0.76–1.27)
Globulin, Total: 3.1 g/dL (ref 1.5–4.5)
Glucose: 87 mg/dL (ref 70–99)
Potassium: 4.3 mmol/L (ref 3.5–5.2)
Sodium: 141 mmol/L (ref 134–144)
Total Protein: 7.5 g/dL (ref 6.0–8.5)
eGFR: 51 mL/min/{1.73_m2} — ABNORMAL LOW (ref >59)

## 2024-01-08 LAB — HEMOGLOBIN A1C
Est. average glucose Bld gHb Est-mCnc: 123 mg/dL
Hgb A1c MFr Bld: 5.9 % — ABNORMAL HIGH (ref 4.8–5.6)

## 2024-01-23 DIAGNOSIS — E119 Type 2 diabetes mellitus without complications: Secondary | ICD-10-CM | POA: Diagnosis not present

## 2024-01-23 DIAGNOSIS — Z7689 Persons encountering health services in other specified circumstances: Secondary | ICD-10-CM | POA: Diagnosis not present

## 2024-01-23 DIAGNOSIS — I10 Essential (primary) hypertension: Secondary | ICD-10-CM | POA: Diagnosis not present

## 2024-01-23 DIAGNOSIS — N1831 Chronic kidney disease, stage 3a: Secondary | ICD-10-CM | POA: Diagnosis not present

## 2024-01-25 DIAGNOSIS — Z419 Encounter for procedure for purposes other than remedying health state, unspecified: Secondary | ICD-10-CM | POA: Diagnosis not present

## 2024-02-05 ENCOUNTER — Ambulatory Visit: Attending: Cardiology | Admitting: Cardiology

## 2024-02-05 VITALS — BP 108/74 | HR 78 | Ht 69.0 in | Wt 290.6 lb

## 2024-02-05 DIAGNOSIS — E782 Mixed hyperlipidemia: Secondary | ICD-10-CM

## 2024-02-05 DIAGNOSIS — I471 Supraventricular tachycardia, unspecified: Secondary | ICD-10-CM | POA: Diagnosis not present

## 2024-02-05 DIAGNOSIS — I10 Essential (primary) hypertension: Secondary | ICD-10-CM | POA: Diagnosis not present

## 2024-02-05 MED ORDER — ROSUVASTATIN CALCIUM 20 MG PO TABS: 20.0000 mg | ORAL_TABLET | Freq: Every day | ORAL | 3 refills | Status: AC

## 2024-02-05 NOTE — Progress Notes (Signed)
 Cardiology Office Note:    Date:  02/05/2024   ID:  PAETON STUDER, DOB 11-20-1965, MRN [id number]  PCP:  Aileen Alexanders, NP   [facility] Providers Cardiologist:  Constancia Delton, MD     Referring MD: Aileen Alexanders, NP   No chief complaint on file.   History of Present Illness:    Anthony Lam is a 58 y.o. male with a hx of hypertension, paroxysmal SVT, CKD 3 who presents for follow-up.    Toprol -XL previously increased due to symptoms of palpitations and paroxysmal SVT.  He states symptoms are well-controlled, has very minimal palpitations if any.  Blood pressure is also well-controlled.  Last cholesterol blood work showed elevated levels.  Was prescribed Crestor  5 mg daily, has not started taking this yet.  He feels well, has no concerns at this time.  Compliant with all meds as prescribed.  Prior notes/testing Echo 05/2023 EF 60 to 65%, mild MR. Cardiac monitor 6/24 paroxysmal SVT, occasional PVCs 1.7% burden.  Past Medical History:  Diagnosis Date   Chronic kidney disease    stage 3a   Diabetes mellitus without complication (HCC)    Hypertension    Migraines    Seizures (HCC)    Sleep apnea    TB (tuberculosis), treated 2009    Past Surgical History:  Procedure Laterality Date   APPENDECTOMY     COLONOSCOPY WITH PROPOFOL  N/A 01/26/2020   Procedure: COLONOSCOPY WITH PROPOFOL ;  Surgeon: Luke Salaam, MD;  Location: [facility] ENDOSCOPY;  Service: Gastroenterology;  Laterality: N/A;   COLONOSCOPY WITH PROPOFOL  N/A 01/28/2023   Procedure: COLONOSCOPY WITH PROPOFOL ;  Surgeon: Luke Salaam, MD;  Location[facility]COPY;  Service: Gastroenterology;  Laterality: N/A;  Patient needs a late morning appointment for Colonoscopy due to transportation ([place])    Current Medications: Current Meds  Medication Sig   Accu-Chek Softclix Lancets lancets 1 each by Other route 2 (two) times daily. Use as instructed   amLODipine  (NORVASC ) 10 MG tablet Take 1 tablet (10 mg  total) by mouth daily.   Blood Glucose Monitoring Suppl (ACCU-CHEK AVIVA PLUS) w/Device KIT 1 each by Does not apply route 2 (two) times daily.   Blood Glucose Monitoring Suppl (BLOOD GLUCOSE MONITOR SYSTEM) w/Device KIT USE AS DIRECTED.   furosemide  (LASIX ) 20 MG tablet Take 1 tablet (20 mg total) by mouth daily.   gabapentin  (NEURONTIN ) 100 MG capsule TAKE ONE CAPSULE BY MOUTH 3 TIMES A DAY   glucose blood (ACCU-CHEK AVIVA PLUS) test strip 1 each by Other route 2 (two) times daily. Use as instructed   irbesartan  (AVAPRO ) 300 MG tablet Take 1 tablet (300 mg total) by mouth daily.   Semaglutide ,0.25 or 0.5MG /DOS, (OZEMPIC , 0.25 OR 0.5 MG/DOSE,) 2 MG/3ML SOPN Inject 0.5 mg into the skin once a week.   [DISCONTINUED] atorvastatin (LIPITOR) 10 MG tablet Take 1 tablet by mouth daily.   [DISCONTINUED] rosuvastatin  (CRESTOR ) 20 MG tablet Take 1 tablet (20 mg total) by mouth daily.     Allergies:   Lactose intolerance (gi), Metformin  and related, and Peanut-containing drug products   Social History   Socioeconomic History   Marital status: Single    Spouse name: Not on file   Number of children: 2   Years of education: Not on file   Highest education level: 12th grade  Occupational History   Not on file  Tobacco Use   Smoking status: Some Days    Types: Cigarettes   Smokeless tobacco: Never   Tobacco comments:  The only time he smokes is when he has a beer 1 cigarette. 1 pack of cigarettes lasts ~2 weeks.  Vaping Use   Vaping status: Never Used  Substance and Sexual Activity   Alcohol use: Yes    Alcohol/week: 1.0 standard drink of alcohol    Types: 1 Cans of beer per week    Comment: 1-2 beers a week   Drug use: Never   Sexual activity: Yes  Other Topics Concern   Not on file  Social History Narrative   Not on file   Social Drivers of Health   Financial Resource Strain: High Risk (07/10/2018)   Overall Financial Resource Strain (CARDIA)    Difficulty of Paying Living  Expenses: Very hard  Food Insecurity: No Food Insecurity (04/12/2021)   Hunger Vital Sign    Worried About Running Out of Food in the Last Year: Never true    Ran Out of Food in the Last Year: Never true  Transportation Needs: No Transportation Needs (04/12/2021)   PRAPARE - Administrator, Civil Service (Medical): No    Lack of Transportation (Non-Medical): No  Physical Activity: Not on file  Stress: Not on file  Social Connections: Not on file     Family History: The patient's family history includes Alzheimer's disease in his father; Constipation in his father; Deep vein thrombosis in his sister; Hypertension in his maternal grandmother and mother.  ROS:   Please see the history of present illness.     All other systems reviewed and are negative.  EKGs/Labs/Other Studies Reviewed:    The following studies were reviewed today:   EKG:  EKG not ordered today.    Recent Labs: 10/28/2023: TSH 1.730 01/07/2024: ALT 15; BUN 13; Creatinine, Ser 1.56; Hemoglobin 15.0; Platelets 329; Potassium 4.3; Sodium 141  Recent Lipid Panel    Component Value Date/Time   CHOL 204 (H) 10/28/2023 1402   TRIG 189 (H) 10/28/2023 1402   HDL 41 10/28/2023 1402   CHOLHDL 5.0 10/28/2023 1402   LDLCALC 129 (H) 10/28/2023 1402     Risk Assessment/Calculations:      Physical Exam:    VS:  BP 108/74   Pulse 78   Ht 5\' 9"  (1.753 m)   Wt 290 lb 9.6 oz (131.8 kg)   SpO2 95%   BMI 42.91 kg/m     Wt Readings from Last 3 Encounters:  02/05/24 290 lb 9.6 oz (131.8 kg)  01/07/24 290 lb 6.4 oz (131.7 kg)  10/28/23 289 lb 3.2 oz (131.2 kg)     GEN:  Well nourished, well developed in no acute distress HEENT: Normal NECK: No JVD; No carotid bruits CARDIAC: RRR, no murmurs, rubs, gallops RESPIRATORY:  Clear to auscultation without rales, wheezing or rhonchi  ABDOMEN: Soft, non-tender, non-distended MUSCULOSKELETAL:  No edema; No deformity  SKIN: Warm and dry NEUROLOGIC:  Alert and  oriented x 3 PSYCHIATRIC:  Normal affect   ASSESSMENT:    1. Paroxysmal SVT (supraventricular tachycardia) (HCC)   2. Mixed hyperlipidemia   3. Primary hypertension   4. Morbid obesity (HCC)    PLAN:    In order of problems listed above:  paroxysmal SVT, occasional PVCs 1.7% burden.  Echo 8/24 EF 60 to 65%.  Continue Toprol -XL 50 mg daily. Hyperlipidemia, cholesterol not controlled.  Start Crestor  to 20 mg daily.  Repeat lipid panel in 6 months. Hypertension, BP controlled.  Continue Toprol -XL 50 mg daily, Avapro  300 mg daily, Norvasc  10 mg daily. Morbid  obesity, low-calorie diet, advised to cut back on sugary drinks.  Exercise also recommended.  Follow-up in 6 months after repeat lipid panel.     Medication Adjustments/Labs and Tests Ordered: Current medicines are reviewed at length with the patient today.  Concerns regarding medicines are outlined above.  Orders Placed This Encounter  Procedures   Lipid panel   EKG 12-Lead   Meds ordered this encounter  Medications   rosuvastatin  (CRESTOR ) 20 MG tablet    Sig: Take 1 tablet (20 mg total) by mouth daily.    Dispense:  90 tablet    Refill:  3    Patient Instructions  Medication Instructions:  Continue same medications *If you need a refill on your cardiac medications before your next appointment, please call your pharmacy*  Lab Work: Lipid panel in 6 months before appointment  Testing/Procedures: None ordered  Follow-Up: At [facility], you and your health needs are our priority.  As part of our continuing mission to provide you with exceptional heart care, our providers are all part of one team.  This team includes your primary Cardiologist (physician) and Advanced Practice Providers or APPs (Physician Assistants and Nurse Practitioners) who all work together to provide you with the care you need, when you need it.  Your next appointment:  6 months    Provider:  Dr.Agbor-Etang   We recommend signing  up for the patient portal called "MyChart".  Sign up information is provided on this After Visit Summary.  MyChart is used to connect with patients for Virtual Visits (Telemedicine).  Patients are able to view lab/test results, encounter notes, upcoming appointments, etc.  Non-urgent messages can be sent to your provider as well.   To learn more about what you can do with MyChart, go to [email or web address].           Signed, Constancia Delton, MD  02/05/2024 11:29 AM    [facility]

## 2024-02-05 NOTE — Patient Instructions (Signed)
 Medication Instructions:  Continue same medications *If you need a refill on your cardiac medications before your next appointment, please call your pharmacy*  Lab Work: Lipid panel in 6 months before appointment  Testing/Procedures: None ordered  Follow-Up: At [facility], you and your health needs are our priority.  As part of our continuing mission to provide you with exceptional heart care, our providers are all part of one team.  This team includes your primary Cardiologist (physician) and Advanced Practice Providers or APPs (Physician Assistants and Nurse Practitioners) who all work together to provide you with the care you need, when you need it.  Your next appointment:  6 months    Provider:  Dr.Agbor-Etang   We recommend signing up for the patient portal called "MyChart".  Sign up information is provided on this After Visit Summary.  MyChart is used to connect with patients for Virtual Visits (Telemedicine).  Patients are able to view lab/test results, encounter notes, upcoming appointments, etc.  Non-urgent messages can be sent to your provider as well.   To learn more about what you can do with MyChart, go to [email or web address].

## 2024-02-10 DIAGNOSIS — G4733 Obstructive sleep apnea (adult) (pediatric): Secondary | ICD-10-CM | POA: Diagnosis not present

## 2024-02-17 DIAGNOSIS — Z7689 Persons encountering health services in other specified circumstances: Secondary | ICD-10-CM | POA: Diagnosis not present

## 2024-02-17 DIAGNOSIS — M5410 Radiculopathy, site unspecified: Secondary | ICD-10-CM | POA: Diagnosis not present

## 2024-02-18 DIAGNOSIS — E113393 Type 2 diabetes mellitus with moderate nonproliferative diabetic retinopathy without macular edema, bilateral: Secondary | ICD-10-CM | POA: Diagnosis not present

## 2024-02-20 ENCOUNTER — Other Ambulatory Visit: Payer: Self-pay | Admitting: Nurse Practitioner

## 2024-02-24 DIAGNOSIS — Z419 Encounter for procedure for purposes other than remedying health state, unspecified: Secondary | ICD-10-CM | POA: Diagnosis not present

## 2024-02-24 NOTE — Telephone Encounter (Signed)
 Requested Prescriptions  Pending Prescriptions Disp Refills   furosemide  (LASIX ) 20 MG tablet [Pharmacy Med Name: FUROSEMIDE  20MG  TABLETS] 90 tablet 0    Sig: TAKE 1 TABLET(20 MG) BY MOUTH DAILY     Cardiovascular:  Diuretics - Loop Failed - 02/24/2024 11:07 AM      Failed - Cr in normal range and within 180 days    Creatinine  Date Value Ref Range Status  08/04/2013 1.68 (H) 0.60 - 1.30 mg/dL Final   Creatinine, Ser  Date Value Ref Range Status  01/07/2024 1.56 (H) 0.76 - 1.27 mg/dL Final         Failed - Mg Level in normal range and within 180 days    Magnesium  Date Value Ref Range Status  08/04/2013 1.9 mg/dL Final    Comment:    0.9-8.1 THERAPEUTIC RANGE: 4-7 mg/dL TOXIC: > 10 mg/dL  -----------------------          Passed - K in normal range and within 180 days    Potassium  Date Value Ref Range Status  01/07/2024 4.3 3.5 - 5.2 mmol/L Final  08/04/2013 3.6 3.5 - 5.1 mmol/L Final         Passed - Ca in normal range and within 180 days    Calcium   Date Value Ref Range Status  01/07/2024 10.1 8.7 - 10.2 mg/dL Final   Calcium , Total  Date Value Ref Range Status  08/04/2013 9.2 8.5 - 10.1 mg/dL Final         Passed - Na in normal range and within 180 days    Sodium  Date Value Ref Range Status  01/07/2024 141 134 - 144 mmol/L Final  08/04/2013 135 (L) 136 - 145 mmol/L Final         Passed - Cl in normal range and within 180 days    Chloride  Date Value Ref Range Status  01/07/2024 102 96 - 106 mmol/L Final  08/04/2013 104 98 - 107 mmol/L Final         Passed - Last BP in normal range    BP Readings from Last 1 Encounters:  02/05/24 108/74         Passed - Valid encounter within last 6 months    Recent Outpatient Visits           1 month ago Essential hypertension   [place] [facility] Aileen Alexanders, NP

## 2024-03-23 DIAGNOSIS — M5416 Radiculopathy, lumbar region: Secondary | ICD-10-CM | POA: Diagnosis not present

## 2024-03-23 DIAGNOSIS — Z7689 Persons encountering health services in other specified circumstances: Secondary | ICD-10-CM | POA: Diagnosis not present

## 2024-03-26 DIAGNOSIS — Z419 Encounter for procedure for purposes other than remedying health state, unspecified: Secondary | ICD-10-CM | POA: Diagnosis not present

## 2024-04-01 DIAGNOSIS — Z7689 Persons encountering health services in other specified circumstances: Secondary | ICD-10-CM | POA: Diagnosis not present

## 2024-04-20 DIAGNOSIS — Z7689 Persons encountering health services in other specified circumstances: Secondary | ICD-10-CM | POA: Diagnosis not present

## 2024-04-21 DIAGNOSIS — Z7689 Persons encountering health services in other specified circumstances: Secondary | ICD-10-CM | POA: Diagnosis not present

## 2024-04-21 DIAGNOSIS — M5416 Radiculopathy, lumbar region: Secondary | ICD-10-CM | POA: Diagnosis not present

## 2024-04-25 DIAGNOSIS — Z419 Encounter for procedure for purposes other than remedying health state, unspecified: Secondary | ICD-10-CM | POA: Diagnosis not present

## 2024-05-04 DIAGNOSIS — M5416 Radiculopathy, lumbar region: Secondary | ICD-10-CM | POA: Diagnosis not present

## 2024-05-11 DIAGNOSIS — G4733 Obstructive sleep apnea (adult) (pediatric): Secondary | ICD-10-CM | POA: Diagnosis not present

## 2024-05-26 DIAGNOSIS — Z419 Encounter for procedure for purposes other than remedying health state, unspecified: Secondary | ICD-10-CM | POA: Diagnosis not present

## 2024-06-01 DIAGNOSIS — Z7689 Persons encountering health services in other specified circumstances: Secondary | ICD-10-CM | POA: Diagnosis not present

## 2024-06-01 DIAGNOSIS — E119 Type 2 diabetes mellitus without complications: Secondary | ICD-10-CM | POA: Diagnosis not present

## 2024-06-11 ENCOUNTER — Other Ambulatory Visit: Payer: Self-pay | Admitting: Nurse Practitioner

## 2024-06-11 DIAGNOSIS — I10 Essential (primary) hypertension: Secondary | ICD-10-CM

## 2024-06-11 NOTE — Telephone Encounter (Unsigned)
 Copied from CRM [id number]. Topic: Clinical - Medication Refill >> Jun 11, 2024 10:19 AM Emylou G wrote: Medication: amLODipine  (NORVASC ) 10 MG tablet  Has the patient contacted their pharmacy? Yes (Agent: If no, request that the patient contact the pharmacy for the refill. If patient does not wish to contact the pharmacy document the reason why and proceed with request.) (Agent: If yes, when and what did the pharmacy advise?) said to call us   This is the patient's preferred pharmacy:  [id number] [place], [place] - [address] AT [facility] OF [address] [place] [address] Phone: [phone] Fax: [phone]  Is this the correct pharmacy for this prescription? Yes If no, delete pharmacy and type the correct one.   Has the prescription been filled recently? No  Is the patient out of the medication? Yes  Has the patient been seen for an appointment in the last year OR does the patient have an upcoming appointment? Yes  Can we respond through MyChart? No  Agent: Please be advised that Rx refills may take up to 3 business days. We ask that you follow-up with your pharmacy.

## 2024-06-12 MED ORDER — AMLODIPINE BESYLATE 10 MG PO TABS: 10.0000 mg | ORAL_TABLET | Freq: Every day | ORAL | 1 refills | Status: AC

## 2024-06-12 NOTE — Telephone Encounter (Signed)
 Requested Prescriptions  Pending Prescriptions Disp Refills   amLODipine  (NORVASC ) 10 MG tablet 90 tablet 1    Sig: Take 1 tablet (10 mg total) by mouth daily.     Cardiovascular: Calcium  Channel Blockers 2 Passed - 06/12/2024  3:18 PM      Passed - Last BP in normal range    BP Readings from Last 1 Encounters:  02/05/24 108/74         Passed - Last Heart Rate in normal range    Pulse Readings from Last 1 Encounters:  02/05/24 78         Passed - Valid encounter within last 6 months    Recent Outpatient Visits           5 months ago Essential hypertension   [place] [facility] Melvin Pao, NP

## 2024-06-12 NOTE — Telephone Encounter (Signed)
 Requested Prescriptions  Refused Prescriptions Disp Refills   amLODipine  (NORVASC ) 10 MG tablet [Pharmacy Med Name: AMLODIPINE  BESYLATE 10MG TABLETS] 90 tablet 1    Sig: TAKE 1 TABLET(10 MG) BY MOUTH DAILY     Cardiovascular: Calcium  Channel Blockers 2 Passed - 06/12/2024  3:18 PM      Passed - Last BP in normal range    BP Readings from Last 1 Encounters:  02/05/24 108/74         Passed - Last Heart Rate in normal range    Pulse Readings from Last 1 Encounters:  02/05/24 78         Passed - Valid encounter within last 6 months    Recent Outpatient Visits           5 months ago Essential hypertension   [place] [facility] Melvin Pao, NP

## 2024-06-25 DIAGNOSIS — M545 Low back pain, unspecified: Secondary | ICD-10-CM | POA: Diagnosis not present

## 2024-06-25 DIAGNOSIS — M5416 Radiculopathy, lumbar region: Secondary | ICD-10-CM | POA: Diagnosis not present

## 2024-06-25 DIAGNOSIS — Z7689 Persons encountering health services in other specified circumstances: Secondary | ICD-10-CM | POA: Diagnosis not present

## 2024-06-26 DIAGNOSIS — Z419 Encounter for procedure for purposes other than remedying health state, unspecified: Secondary | ICD-10-CM | POA: Diagnosis not present

## 2024-07-09 ENCOUNTER — Ambulatory Visit: Admitting: Nurse Practitioner

## 2024-07-09 ENCOUNTER — Encounter: Payer: Self-pay | Admitting: Nurse Practitioner

## 2024-07-09 VITALS — BP 134/87 | HR 71 | Temp 97.8°F | Ht 69.0 in | Wt 276.6 lb

## 2024-07-09 DIAGNOSIS — N1831 Chronic kidney disease, stage 3a: Secondary | ICD-10-CM | POA: Diagnosis not present

## 2024-07-09 DIAGNOSIS — E118 Type 2 diabetes mellitus with unspecified complications: Secondary | ICD-10-CM | POA: Diagnosis not present

## 2024-07-09 DIAGNOSIS — Z23 Encounter for immunization: Secondary | ICD-10-CM

## 2024-07-09 DIAGNOSIS — E119 Type 2 diabetes mellitus without complications: Secondary | ICD-10-CM | POA: Diagnosis not present

## 2024-07-09 DIAGNOSIS — E7800 Pure hypercholesterolemia, unspecified: Secondary | ICD-10-CM | POA: Diagnosis not present

## 2024-07-09 DIAGNOSIS — I129 Hypertensive chronic kidney disease with stage 1 through stage 4 chronic kidney disease, or unspecified chronic kidney disease: Secondary | ICD-10-CM

## 2024-07-09 DIAGNOSIS — Z7689 Persons encountering health services in other specified circumstances: Secondary | ICD-10-CM | POA: Diagnosis not present

## 2024-07-09 DIAGNOSIS — Z7985 Long-term (current) use of injectable non-insulin antidiabetic drugs: Secondary | ICD-10-CM

## 2024-07-09 NOTE — Progress Notes (Signed)
 BP 134/87   Pulse 71   Temp 97.8 F (36.6 C) (Oral)   Ht 5' 9 (1.753 m)   Wt 276 lb 9.6 oz (125.5 kg)   SpO2 97%   BMI 40.85 kg/m    Subjective:    Patient ID: Anthony Lam, male    DOB: Jul 16, 1966, 58 y.o.   MRN: [id number]  HPI: Anthony Lam is a 58 y.o. male  Chief Complaint  Patient presents with   Diabetes   Hyperlipidemia   Hypertension   HYPERTENSION Will be seeing Cardiology next month.   Hypertension status: controlled  Satisfied with current treatment? no Duration of hypertension: years BP monitoring frequency:  weekly BP range: 130/80 BP medication side effects:  no Medication compliance: excellent compliance Previous BP meds:amlodipine  and irbesartan  Aspirin: no Recurrent headaches: no Visual changes: no Palpitations: no Dyspnea: no Chest pain: no Lower extremity edema: no Dizzy/lightheaded: no  DIABETES Patient states he is doing well with Ozempic  1mg .   Hypoglycemic episodes:no Polydipsia/polyuria: no Visual disturbance: no Chest pain: no Paresthesias: no Glucose Monitoring: no  Accucheck frequency: Not Checking  Fasting glucose:  Post prandial:  Evening:  Before meals: Taking Insulin?: no  Long acting insulin:  Short acting insulin: Blood Pressure Monitoring: not checking Retinal Examination: up to date- done with Dr. Carolee (may or April) Foot Exam: Up to Date Diabetic Education: Not Completed Pneumovax: Up to Date Influenza: Up to Date Aspirin: no  CHRONIC KIDNEY DISEASE Needs to make follow up appt with Nephrology.   Secondary to uncontrolled HTN CKD status: controlled Medications renally dose: no Previous renal evaluation: no Pneumovax:  Up to Date Influenza Vaccine:  Up to Date  SLEEP APNEA States he wakes up feeling groggy. Sleep apnea status: controlled Duration: chronic Satisfied with current treatment?:  yes CPAP use:  yes Sleep quality with CPAP use: excellent Treament compliance:excellent  compliance Last sleep study:  Treatments attempted:  Wakes feeling refreshed:  no Daytime hypersomnolence:  no Fatigue:  yes Insomnia:  no Good sleep hygiene:  no Difficulty falling asleep:  no Difficulty staying asleep:  no Snoring bothers bed partner:  no Observed apnea by bed partner: yes Obesity:  yes Hypertension: yes  Pulmonary hypertension:  no Coronary artery disease:  no   Relevant past medical, surgical, family and social history reviewed and updated as indicated. Interim medical history since our last visit reviewed. Allergies and medications reviewed and updated.  Review of Systems  Eyes:  Negative for visual disturbance.  Respiratory:  Negative for chest tightness and shortness of breath.   Cardiovascular:  Negative for chest pain, palpitations and leg swelling.  Neurological:  Negative for dizziness, light-headedness and headaches.    Per HPI unless specifically indicated above     Objective:    BP 134/87   Pulse 71   Temp 97.8 F (36.6 C) (Oral)   Ht 5' 9 (1.753 m)   Wt 276 lb 9.6 oz (125.5 kg)   SpO2 97%   BMI 40.85 kg/m   Wt Readings from Last 3 Encounters:  07/09/24 276 lb 9.6 oz (125.5 kg)  02/05/24 290 lb 9.6 oz (131.8 kg)  01/07/24 290 lb 6.4 oz (131.7 kg)    Physical Exam Vitals and nursing note reviewed.  Constitutional:      General: He is not in acute distress.    Appearance: Normal appearance. He is not ill-appearing, toxic-appearing or diaphoretic.  HENT:     Head: Normocephalic.     Right Ear: External  ear normal.     Left Ear: External ear normal.     Nose: Nose normal. No congestion or rhinorrhea.     Mouth/Throat:     Mouth: Mucous membranes are moist.  Eyes:     General:        Right eye: No discharge.        Left eye: No discharge.     Extraocular Movements: Extraocular movements intact.     Conjunctiva/sclera: Conjunctivae normal.     Pupils: Pupils are equal, round, and reactive to light.  Cardiovascular:     Rate  and Rhythm: Normal rate and regular rhythm.     Heart sounds: No murmur heard. Pulmonary:     Effort: Pulmonary effort is normal. No respiratory distress.     Breath sounds: Normal breath sounds. No wheezing, rhonchi or rales.  Abdominal:     General: Abdomen is flat. Bowel sounds are normal.  Musculoskeletal:     Cervical back: Normal range of motion and neck supple.  Skin:    General: Skin is warm and dry.     Capillary Refill: Capillary refill takes less than 2 seconds.  Neurological:     General: No focal deficit present.     Mental Status: He is alert and oriented to person, place, and time.  Psychiatric:        Mood and Affect: Mood normal.        Behavior: Behavior normal.        Thought Content: Thought content normal.        Judgment: Judgment normal.     Results for orders placed or performed in visit on 01/07/24  Comp Met (CMET)   Collection Time: 01/07/24  3:22 PM  Result Value Ref Range   Glucose 87 70 - 99 mg/dL   BUN 13 6 - 24 mg/dL   Creatinine, Ser 8.43 (H) 0.76 - 1.27 mg/dL   eGFR 51 (L) >40 fO/fpw/8.26   BUN/Creatinine Ratio 8 (L) 9 - 20   Sodium 141 134 - 144 mmol/L   Potassium 4.3 3.5 - 5.2 mmol/L   Chloride 102 96 - 106 mmol/L   CO2 23 20 - 29 mmol/L   Calcium  10.1 8.7 - 10.2 mg/dL   Total Protein 7.5 6.0 - 8.5 g/dL   Albumin 4.4 3.8 - 4.9 g/dL   Globulin, Total 3.1 1.5 - 4.5 g/dL   Bilirubin Total 0.3 0.0 - 1.2 mg/dL   Alkaline Phosphatase 84 44 - 121 IU/L   AST 14 0 - 40 IU/L   ALT 15 0 - 44 IU/L  HgB A1c   Collection Time: 01/07/24  3:22 PM  Result Value Ref Range   Hgb A1c MFr Bld 5.9 (H) 4.8 - 5.6 %   Est. average glucose Bld gHb Est-mCnc 123 mg/dL  CBC w/Diff   Collection Time: 01/07/24  3:22 PM  Result Value Ref Range   WBC 7.6 3.4 - 10.8 x10E3/uL   RBC 5.56 4.14 - 5.80 x10E6/uL   Hemoglobin 15.0 13.0 - 17.7 g/dL   Hematocrit 53.3 62.4 - 51.0 %   MCV 84 79 - 97 fL   MCH 27.0 26.6 - 33.0 pg   MCHC 32.2 31.5 - 35.7 g/dL   RDW 83.9  (H) 88.3 - 15.4 %   Platelets 329 150 - 450 x10E3/uL   Neutrophils 65 Not Estab. %   Lymphs 25 Not Estab. %   Monocytes 7 Not Estab. %   Eos 3 Not Estab. %  Basos 0 Not Estab. %   Neutrophils Absolute 4.9 1.4 - 7.0 x10E3/uL   Lymphocytes Absolute 1.9 0.7 - 3.1 x10E3/uL   Monocytes Absolute 0.5 0.1 - 0.9 x10E3/uL   EOS (ABSOLUTE) 0.2 0.0 - 0.4 x10E3/uL   Basophils Absolute 0.0 0.0 - 0.2 x10E3/uL   Immature Granulocytes 0 Not Estab. %   Immature Grans (Abs) 0.0 0.0 - 0.1 x10E3/uL      Assessment & Plan:   Problem List Items Addressed This Visit       Endocrine   Controlled diabetes mellitus type 2 with complications (HCC)   Relevant Medications   OZEMPIC , 1 MG/DOSE, 4 MG/3ML SOPN   Other Relevant Orders   Hemoglobin A1c   Diabetes mellitus treated with injections of non-insulin medication (HCC)   Relevant Medications   OZEMPIC , 1 MG/DOSE, 4 MG/3ML SOPN   Other Relevant Orders   Hemoglobin A1c     Genitourinary   Benign hypertensive renal disease - Primary   Relevant Orders   Comprehensive metabolic panel with GFR   CKD (chronic kidney disease) stage 3, GFR 30-59 ml/min (HCC)     Other   Hypercholesteremia   Relevant Orders   Lipid panel   Other Visit Diagnoses       Need for influenza vaccination       Relevant Orders   Flu vaccine trivalent PF, 6mos and older(Flulaval,Afluria,Fluarix,Fluzone) (Completed)        Follow up plan: No follow-ups on file.

## 2024-07-09 NOTE — Assessment & Plan Note (Signed)
 Chronic.  Labs ordered at visit today.  Will make recommendations based on lab results. Needs to make follow up appt with Nephrology.

## 2024-07-09 NOTE — Assessment & Plan Note (Addendum)
 Chronic.  Controlled.  Continue with current medication regimen of Ozempic  1mg  weekly.  Microalbumin checked at visit today.  Does not check sugars at home.  Labs ordered today.  Return to clinic in 6 months for reevaluation.  Call sooner if concerns arise.

## 2024-07-09 NOTE — Assessment & Plan Note (Signed)
Chronic. Continue with Crestor 5mg daily.  Labs ordered today.  Follow up in 6 months.  Call sooner if concerns arise.   

## 2024-07-09 NOTE — Assessment & Plan Note (Addendum)
 Chronic.  Controlled.  Continue with current medication regimen of Amlodipine  and Irbesartan .   Microalbumin up to date.  Labs ordered today.  Return to clinic in 6 months for reevaluation.  Call sooner if concerns arise.

## 2024-07-10 ENCOUNTER — Ambulatory Visit: Payer: Self-pay | Admitting: Nurse Practitioner

## 2024-07-10 LAB — COMPREHENSIVE METABOLIC PANEL WITH GFR
ALT: 12 IU/L (ref 0–44)
AST: 14 IU/L (ref 0–40)
Albumin: 4.3 g/dL (ref 3.8–4.9)
Alkaline Phosphatase: 93 IU/L (ref 47–123)
BUN/Creatinine Ratio: 10 (ref 9–20)
BUN: 16 mg/dL (ref 6–24)
Bilirubin Total: 0.7 mg/dL (ref 0.0–1.2)
CO2: 23 mmol/L (ref 20–29)
Calcium: 9.3 mg/dL (ref 8.7–10.2)
Chloride: 100 mmol/L (ref 96–106)
Creatinine, Ser: 1.53 mg/dL — ABNORMAL HIGH (ref 0.76–1.27)
Globulin, Total: 3.2 g/dL (ref 1.5–4.5)
Glucose: 81 mg/dL (ref 70–99)
Potassium: 4.7 mmol/L (ref 3.5–5.2)
Sodium: 137 mmol/L (ref 134–144)
Total Protein: 7.5 g/dL (ref 6.0–8.5)
eGFR: 52 mL/min/1.73 — ABNORMAL LOW (ref >59)

## 2024-07-10 LAB — LIPID PANEL
Chol/HDL Ratio: 3.6 ratio (ref 0.0–5.0)
Cholesterol, Total: 174 mg/dL (ref 100–199)
HDL: 49 mg/dL (ref >39)
LDL Chol Calc (NIH): 107 mg/dL — ABNORMAL HIGH (ref 0–99)
Triglycerides: 100 mg/dL (ref 0–149)
VLDL Cholesterol Cal: 18 mg/dL (ref 5–40)

## 2024-07-10 LAB — HEMOGLOBIN A1C
Est. average glucose Bld gHb Est-mCnc: 114 mg/dL
Hgb A1c MFr Bld: 5.6 % (ref 4.8–5.6)

## 2024-08-26 DIAGNOSIS — Z419 Encounter for procedure for purposes other than remedying health state, unspecified: Secondary | ICD-10-CM | POA: Diagnosis not present

## 2024-09-01 NOTE — Progress Notes (Unsigned)
 Cardiology Office Note    Date:  09/02/2024   ID:  Anthony Lam, DOB Aug 16, 1966, MRN [id number]  PCP:  Melvin Pao, NP  Cardiologist:  Redell Cave, MD  Electrophysiologist:  None   Chief Complaint: Follow-up  History of Present Illness:   Anthony Lam is a 58 y.o. male with history of palpitations with brief nonsustained PSVT and NSVT, mild mitral regurgitation, mild aortic insufficiency, dilated ascending aorta, CKD stage III, DM, HTN, HLD, and obesity who presents for follow-up of palpitations.  He was evaluated by Dr. Cave in 02/2023 for evaluation of palpitations that he attributed to initiation of rosuvastatin  with palpitations persisting despite self-discontinuation of rosuvastatin .  Zio patch in 02/2023 showed a predominant rhythm of sinus with an average rate of 76 bpm with a range of 51 to 187 bpm, 2 runs of NSVT occurred with the fastest interval lasting 5 beats with a maximal rate of 187 bpm and the longest interval lasting 9 beats, 2 episodes of SVT with the longest interval lasting 15 beats, occasional PACs with a burden of 1.7%, and rare ventricular ectopy.  In follow-up, he continued to note palpitations leading to the initiation of Toprol -XL 25 mg.  He was also advised to restart rosuvastatin .  Echo in 05/2023 showed an EF of 60 to 65%, no regional wall motion abnormalities, moderate LVH, grade 1 diastolic dysfunction, normal RV systolic function and ventricular cavity size, mild mitral regurgitation, mild aortic insufficiency with indeterminate number of cusps, mild dilatation of the ascending aorta measuring 42 mm, and normal CVP.  At visit in 07/2023, Toprol -XL was titrated to 50 mg daily in the setting of continued palpitations, though improved from prior.  He was most recently seen in the office in 01/2024 with improvement in palpitations.  Had not yet resumed rosuvastatin .  He was recommended to continue Toprol -XL 50 mg, as well as ibuprofen 300 mg,  Norvasc  10 mg, and start rosuvastatin  20 mg.  He comes in doing well from a cardiac perspective and is without symptoms of angina or cardiac decompensation.  No dyspnea, dizziness, presyncope, or syncope.  Palpitation burden is much improved and typically only noticeable if he drinks coffee or a beer.  Will drink approximately 1 beer per week.  When drinking a beer he may smoke a part of a black and mild.  No further cigarette use.  Weight is down 22 pounds today when compared to last clinic visit in 01/2024.  He indicates this is intentional with exercise, lifestyle modification, and GLP-1 therapy.  No lower extremity swelling or progressive orthopnea.  No falls or symptoms concerning for bleeding.  Has not yet started rosuvastatin .  Overall feels like he is doing well.   Labs independently reviewed: 06/2024 - TC 174, TG 100, HDL 49, LDL 107, A1c 5.6, BUN 16, serum creatinine 1.53, potassium 4.7, albumin 4.3, AST/ALT normal 12/2023 - Hgb 15.0, PLT 329, A1c 5.9 10/2023 - TSH normal  Past Medical History:  Diagnosis Date   Chronic kidney disease    stage 3a   Diabetes mellitus without complication (HCC)    Hypertension    Migraines    Seizures (HCC)    Sleep apnea    TB (tuberculosis), treated 2009    Past Surgical History:  Procedure Laterality Date   APPENDECTOMY     COLONOSCOPY WITH PROPOFOL  N/A 01/26/2020   Procedure: COLONOSCOPY WITH PROPOFOL ;  Surgeon: Therisa Bi, MD;  Location: [facility] ENDOSCOPY;  Service: Gastroenterology;  Laterality: N/A;   COLONOSCOPY WITH  PROPOFOL  N/A 01/28/2023   Procedure: COLONOSCOPY WITH PROPOFOL ;  Surgeon: Therisa Bi, MD;  Location: [facility] ENDOSCOPY;  Service: Gastroenterology;  Laterality: N/A;  Patient needs a late morning appointment for Colonoscopy due to transportation ([place])    Current Medications: Current Meds  Medication Sig   amLODipine  (NORVASC ) 10 MG tablet Take 1 tablet (10 mg total) by mouth daily.   fluticasone  (FLONASE ) 50 MCG/ACT nasal spray  SPRAY 1 SPRAY IN EACH NOSTRIL ONCE DAILY.   gabapentin  (NEURONTIN ) 100 MG capsule TAKE ONE CAPSULE BY MOUTH 3 TIMES A DAY   irbesartan  (AVAPRO ) 300 MG tablet Take 1 tablet (300 mg total) by mouth daily.   metoprolol  succinate (TOPROL -XL) 50 MG 24 hr tablet Take 50 mg by mouth daily. Take with or immediately following a meal.   OZEMPIC , 1 MG/DOSE, 4 MG/3ML SOPN Inject 1 mg into the skin once a week.    Allergies:   Lactose intolerance (gi), Metformin  and related, and Peanut-containing drug products   Social History   Socioeconomic History   Marital status: Single    Spouse name: Not on file   Number of children: 2   Years of education: Not on file   Highest education level: 12th grade  Occupational History   Not on file  Tobacco Use   Smoking status: Some Days    Types: Cigarettes   Smokeless tobacco: Never   Tobacco comments:    The only time he smokes is when he has a beer 1 cigarette. 1 pack of cigarettes lasts ~2 weeks.  Vaping Use   Vaping status: Never Used  Substance and Sexual Activity   Alcohol use: Yes    Alcohol/week: 1.0 standard drink of alcohol    Types: 1 Cans of beer per week    Comment: 1-2 beers a week   Drug use: Never   Sexual activity: Yes  Other Topics Concern   Not on file  Social History Narrative   Not on file   Social Drivers of Health   Financial Resource Strain: High Risk (07/10/2018)   Overall Financial Resource Strain (CARDIA)    Difficulty of Paying Living Expenses: Very hard  Food Insecurity: No Food Insecurity (04/12/2021)   Hunger Vital Sign    Worried About Running Out of Food in the Last Year: Never true    Ran Out of Food in the Last Year: Never true  Transportation Needs: No Transportation Needs (04/12/2021)   PRAPARE - Administrator, Civil Service (Medical): No    Lack of Transportation (Non-Medical): No  Physical Activity: Not on file  Stress: Not on file  Social Connections: Not on file     Family History:  The  patient's family history includes Alzheimer's disease in his father; Constipation in his father; Deep vein thrombosis in his sister; Hypertension in his maternal grandmother and mother.  ROS:   12-point review of systems is negative unless otherwise noted in the HPI.   EKGs/Labs/Other Studies Reviewed:    Studies reviewed were summarized above. The additional studies were reviewed today:  2D echo 05/16/2023: 1. Left ventricular ejection fraction, by estimation, is 60 to 65%. Left  ventricular ejection fraction by PLAX is 64 %. The left ventricle has  normal function. The left ventricle has no regional wall motion  abnormalities. There is moderate left  ventricular hypertrophy. Left ventricular diastolic parameters are  consistent with Grade I diastolic dysfunction (impaired relaxation).   2. Right ventricular systolic function is normal. The right ventricular  size is normal.   3. The mitral valve is normal in structure. Mild mitral valve  regurgitation. No evidence of mitral stenosis.   4. The aortic valve has an indeterminant number of cusps. Aortic valve  regurgitation is mild. No aortic stenosis is present.   5. There is mild dilatation of the ascending aorta, measuring 42 mm.   6. The inferior vena cava is normal in size with greater than 50%  respiratory variability, suggesting right atrial pressure of 3 mmHg.  __________  Zio patch 02/2023: Patient had a min HR of 51 bpm, max HR of 187 bpm, and avg HR of 76 bpm. Predominant underlying rhythm was Sinus Rhythm. 2 Ventricular Tachycardia runs occurred, the run with the fastest interval lasting 5 beats with a max rate of 187 bpm, the longest  lasting 9 beats with an avg rate of 159 bpm.  the  longest lasting 15 beats with an avg rate of 113 bpm. Some episodes of Supraventricular Tachycardia may be possible Atrial Tachycardia with variable block. Isolated SVEs were occasional (1.7%, 17268), SVE Couplets were rare (<1.0%, 236), and SVE   Triplets were rare (<1.0%, 20). Isolated VEs were rare (<1.0%), and no VE Couplets or VE Triplets were present.   Conclusion Occasional paroxysmal SVT Occasional PVCs, 1.7% burden, nonsustained VT No significant sustained arrhythmias   EKG:  EKG is ordered today.  The EKG ordered today demonstrates NSR, 73 bpm, left axis deviation, baseline artifact, no acute ST-T changes  Recent Labs: 10/28/2023: TSH 1.730 01/07/2024: Hemoglobin 15.0; Platelets 329 07/09/2024: ALT 12; BUN 16; Creatinine, Ser 1.53; Potassium 4.7; Sodium 137  Recent Lipid Panel    Component Value Date/Time   CHOL 174 07/09/2024 1336   TRIG 100 07/09/2024 1336   HDL 49 07/09/2024 1336   CHOLHDL 3.6 07/09/2024 1336   LDLCALC 107 (H) 07/09/2024 1336    PHYSICAL EXAM:    VS:  BP 126/78 (BP Location: Left Arm, Patient Position: Sitting)   Pulse 73   Ht 5' 9 (1.753 m)   Wt 278 lb 6.4 oz (126.3 kg)   SpO2 96%   BMI 41.11 kg/m   BMI: Body mass index is 41.11 kg/m.  Physical Exam Vitals reviewed.  Constitutional:      Appearance: He is well-developed.  HENT:     Head: Normocephalic and atraumatic.  Eyes:     General:        Right eye: No discharge.        Left eye: No discharge.  Cardiovascular:     Rate and Rhythm: Normal rate and regular rhythm.     Heart sounds: Normal heart sounds, S1 normal and S2 normal. Heart sounds not distant. No midsystolic click and no opening snap. No murmur heard.    No friction rub.  Pulmonary:     Effort: Pulmonary effort is normal. No respiratory distress.     Breath sounds: Normal breath sounds. No decreased breath sounds, wheezing, rhonchi or rales.  Musculoskeletal:     Cervical back: Normal range of motion.     Right lower leg: No edema.     Left lower leg: No edema.  Skin:    General: Skin is warm and dry.     Nails: There is no clubbing.  Neurological:     Mental Status: He is alert and oriented to person, place, and time.  Psychiatric:        Speech: Speech  normal.        Behavior: Behavior normal.  Thought Content: Thought content normal.        Judgment: Judgment normal.     Wt Readings from Last 3 Encounters:  09/02/24 278 lb 6.4 oz (126.3 kg)  07/09/24 276 lb 9.6 oz (125.5 kg)  02/05/24 290 lb 9.6 oz (131.8 kg)     ASSESSMENT & PLAN:   Palpitations/PSVT/NSVT: Burden overall improved and typically exacerbated with coffee and beer.  Recommend he abstain from aggravating factors.  Remains on Toprol -XL 50 mg.  Valvular heart disease: Echo in 05/2023 showed mild mitral and aortic insufficiency.  Monitor with periodic echo.  Dilated ascending thoracic aorta: Echo in 05/2023 showed a mildly dilated ascending thoracic aorta measuring 42 mm.  Indeterminate number of aortic valve cusps.  We will attempt to evaluate further with calcium  score as outlined below.  If unable to proceed with calcium  score due to financial constraints, will need to follow-up echo to trend.  Would avoid contrast in an effort to preserve renal function.  HTN: Blood pressure is well-controlled in the office today.  Remains on amlodipine  10 mg, irbesartan  300 mg, and Toprol -XL 50 mg.  HLD: LDL 107 in 06/2024.  Not currently taking rosuvastatin .  Recommend calcium  score for further risk stratification to help guide pharmacotherapy.  Obesity: His weight is down 22 pounds today when compared to his visit in 01/2022.  This is intentional and achieved through lifestyle modification, exercise, and GLP-1 therapy.  CKD stage III: Serum creatinine stable.  Avoid nephrotoxic agents.    Disposition: F/u with Dr. Darliss or an APP in 2 months.   Medication Adjustments/Labs and Tests Ordered: Current medicines are reviewed at length with the patient today.  Concerns regarding medicines are outlined above. Medication changes, Labs and Tests ordered today are summarized above and listed in the Patient Instructions accessible in Encounters.   Signed, Bernardino Bring,  PA-C 09/02/2024 4:34 PM     [facility] [place] [address] [place], [place] [address] [phone]

## 2024-09-02 ENCOUNTER — Ambulatory Visit: Attending: Physician Assistant | Admitting: Physician Assistant

## 2024-09-02 ENCOUNTER — Encounter: Payer: Self-pay | Admitting: Physician Assistant

## 2024-09-02 VITALS — BP 126/78 | HR 73 | Ht 69.0 in | Wt 278.4 lb

## 2024-09-02 DIAGNOSIS — E782 Mixed hyperlipidemia: Secondary | ICD-10-CM | POA: Diagnosis not present

## 2024-09-02 DIAGNOSIS — I4729 Other ventricular tachycardia: Secondary | ICD-10-CM | POA: Insufficient documentation

## 2024-09-02 DIAGNOSIS — I77810 Thoracic aortic ectasia: Secondary | ICD-10-CM | POA: Insufficient documentation

## 2024-09-02 DIAGNOSIS — I471 Supraventricular tachycardia, unspecified: Secondary | ICD-10-CM | POA: Insufficient documentation

## 2024-09-02 DIAGNOSIS — R002 Palpitations: Secondary | ICD-10-CM | POA: Diagnosis not present

## 2024-09-02 DIAGNOSIS — I340 Nonrheumatic mitral (valve) insufficiency: Secondary | ICD-10-CM | POA: Diagnosis not present

## 2024-09-02 DIAGNOSIS — I351 Nonrheumatic aortic (valve) insufficiency: Secondary | ICD-10-CM | POA: Diagnosis not present

## 2024-09-02 DIAGNOSIS — I10 Essential (primary) hypertension: Secondary | ICD-10-CM | POA: Diagnosis not present

## 2024-09-02 NOTE — Patient Instructions (Signed)
 Medication Instructions:  Your physician recommends that you continue on your current medications as directed. Please refer to the Current Medication list given to you today.   *If you need a refill on your cardiac medications before your next appointment, please call your pharmacy*  Lab Work: None ordered at this time  If you have labs (blood work) drawn today and your tests are completely normal, you will receive your results only by: MyChart Message (if you have MyChart) OR A paper copy in the mail If you have any lab test that is abnormal or we need to change your treatment, we will call you to review the results.  Testing/Procedures: shCT coronary calcium  score.   - $99 out of pocket cost at the time of your test - Call [phone] to schedule at your convenience.  [facility] [address] [place], [place] [address]   Coronary CalciumScan A coronary calcium  scan is an imaging test used to look for deposits of calcium  and other fatty materials (plaques) in the inner lining of the blood vessels of the heart (coronary arteries). These deposits of calcium  and plaques can partly clog and narrow the coronary arteries without producing any symptoms or warning signs. This puts a person at risk for a heart attack. This test can detect these deposits before symptoms develop. Tell a health care provider about: Any allergies you have. All medicines you are taking, including vitamins, herbs, eye drops, creams, and over-the-counter medicines. Any problems you or family members have had with anesthetic medicines. Any blood disorders you have. Any surgeries you have had. Any medical conditions you have. Whether you are pregnant or may be pregnant. What are the risks? Generally, this is a safe procedure. However, problems may occur, including: Harm to a pregnant woman and her unborn baby. This test involves the use of radiation. Radiation exposure  can be dangerous to a pregnant woman and her unborn baby. If you are pregnant, you generally should not have this procedure done. Slight increase in the risk of cancer. This is because of the radiation involved in the test. What happens before the procedure? No preparation is needed for this procedure. What happens during the procedure? You will undress and remove any jewelry around your neck or chest. You will put on a hospital gown. Sticky electrodes will be placed on your chest. The electrodes will be connected to an electrocardiogram (ECG) machine to record a tracing of the electrical activity of your heart. A CT scanner will take pictures of your heart. During this time, you will be asked to lie still and hold your breath for 2-3 seconds while a picture of your heart is being taken. The procedure may vary among health care providers and hospitals. What happens after the procedure? You can get dressed. You can return to your normal activities. It is up to you to get the results of your test. Ask your health care provider, or the department that is doing the test, when your results will be ready. Summary A coronary calcium  scan is an imaging test used to look for deposits of calcium  and other fatty materials (plaques) in the inner lining of the blood vessels of the heart (coronary arteries). Generally, this is a safe procedure. Tell your health care provider if you are pregnant or may be pregnant. No preparation is needed for this procedure. A CT scanner will take pictures of your heart. You can return to your normal activities after the scan  is done. This information is not intended to replace advice given to you by your health care provider. Make sure you discuss any questions you have with your health care provider. Document Released: 03/29/2008 Document Revised: 08/20/2016 Document Reviewed: 08/20/2016 Elsevier Interactive Patient Education  2017 [employer].   Follow-Up: At [facility], you and your health needs are our priority.  As part of our continuing mission to provide you with exceptional heart care, our providers are all part of one team.  This team includes your primary Cardiologist (physician) and Advanced Practice Providers or APPs (Physician Assistants and Nurse Practitioners) who all work together to provide you with the care you need, when you need it.  Your next appointment:   2 month(s)  Provider:   You may see Redell Cave, MD or Bernardino Bring, PA-C  We recommend signing up for the patient portal called MyChart.  Sign up information is provided on this After Visit Summary.  MyChart is used to connect with patients for Virtual Visits (Telemedicine).  Patients are able to view lab/test results, encounter notes, upcoming appointments, etc.  Non-urgent messages can be sent to your provider as well.   To learn more about what you can do with MyChart, go to [email or web address].

## 2024-11-10 ENCOUNTER — Ambulatory Visit: Admitting: Physician Assistant

## 2024-11-18 ENCOUNTER — Other Ambulatory Visit: Payer: Self-pay | Admitting: Nurse Practitioner

## 2024-11-18 NOTE — Telephone Encounter (Signed)
 Copied from CRM #[id number]. Topic: Clinical - Medication Refill >> Nov 18, 2024  2:55 PM Berwyn MATSU wrote: Medication: irbesartan  (AVAPRO ) 300 MG tablet   Has the patient contacted their pharmacy? Yes (Agent: If no, request that the patient contact the pharmacy for the refill. If patient does not wish to contact the pharmacy document the reason why and proceed with request.) (Agent: If yes, when and what did the pharmacy advise?)  This is the patient's preferred pharmacy:  [id number] [place], [place] - [address] AT [facility] OF [address] [place] [address] Phone: [phone] Fax: [phone]  Is this the correct pharmacy for this prescription? Yes If no, delete pharmacy and type the correct one.   Has the prescription been filled recently? Yes  Is the patient out of the medication? No 3 pills left   Has the patient been seen for an appointment in the last year OR does the patient have an upcoming appointment? Yes  Can we respond through MyChart? Yes  Agent: Please be advised that Rx refills may take up to 3 business days. We ask that you follow-up with your pharmacy.

## 2024-11-20 MED ORDER — IRBESARTAN 300 MG PO TABS: 300.0000 mg | ORAL_TABLET | Freq: Every day | ORAL | 0 refills | Status: AC

## 2024-11-20 NOTE — Telephone Encounter (Signed)
 Requested Prescriptions  Pending Prescriptions Disp Refills   irbesartan  (AVAPRO ) 300 MG tablet 90 tablet 0    Sig: Take 1 tablet (300 mg total) by mouth daily.     Cardiovascular:  Angiotensin Receptor Blockers Failed - 11/20/2024 11:24 AM      Failed - Cr in normal range and within 180 days    Creatinine  Date Value Ref Range Status  08/04/2013 1.68 (H) 0.60 - 1.30 mg/dL Final   Creatinine, Ser  Date Value Ref Range Status  07/09/2024 1.53 (H) 0.76 - 1.27 mg/dL Final         Passed - K in normal range and within 180 days    Potassium  Date Value Ref Range Status  07/09/2024 4.7 3.5 - 5.2 mmol/L Final  08/04/2013 3.6 3.5 - 5.1 mmol/L Final         Passed - Patient is not pregnant      Passed - Last BP in normal range    BP Readings from Last 1 Encounters:  09/02/24 126/78         Passed - Valid encounter within last 6 months    Recent Outpatient Visits           4 months ago Benign hypertensive renal disease   [place] [facility] Melvin Pao, NP   10 months ago Essential hypertension   [place] [facility] [place] Melvin Pao, NP       Future Appointments             In 3 weeks Dunn, Bernardino HERO, PA-C  [facility]

## 2024-12-16 ENCOUNTER — Ambulatory Visit: Admitting: Physician Assistant

## 2025-01-06 ENCOUNTER — Encounter: Admitting: Nurse Practitioner

## 2025-02-05 ENCOUNTER — Other Ambulatory Visit
